# Patient Record
Sex: Male | Born: 1945
Health system: Southern US, Community
[De-identification: ages and names within clinical notes are randomized; demographics above are authoritative.]

## PROBLEM LIST (undated history)

## (undated) DIAGNOSIS — I10 Essential (primary) hypertension: Secondary | ICD-10-CM

## (undated) DIAGNOSIS — E7800 Pure hypercholesterolemia, unspecified: Secondary | ICD-10-CM

## (undated) DIAGNOSIS — F419 Anxiety disorder, unspecified: Secondary | ICD-10-CM

## (undated) DIAGNOSIS — C3490 Malignant neoplasm of unspecified part of unspecified bronchus or lung: Secondary | ICD-10-CM

## (undated) DIAGNOSIS — E785 Hyperlipidemia, unspecified: Secondary | ICD-10-CM

## (undated) DIAGNOSIS — H9190 Unspecified hearing loss, unspecified ear: Secondary | ICD-10-CM

## (undated) HISTORY — PX: DENTAL SURGERY: SHX609

## (undated) HISTORY — DX: Hyperlipidemia, unspecified: E78.5

## (undated) HISTORY — DX: Essential (primary) hypertension: I10

## (undated) HISTORY — DX: Anxiety disorder, unspecified: F41.9

## (undated) HISTORY — PX: POLYPECTOMY: SHX149

## (undated) HISTORY — PX: COLONOSCOPY: SHX174

---

## 2005-12-21 ENCOUNTER — Ambulatory Visit: Payer: Self-pay | Admitting: Gastroenterology

## 2006-01-01 ENCOUNTER — Ambulatory Visit: Payer: Self-pay | Admitting: Gastroenterology

## 2006-01-01 ENCOUNTER — Encounter (INDEPENDENT_AMBULATORY_CARE_PROVIDER_SITE_OTHER): Payer: Self-pay | Admitting: *Deleted

## 2008-01-31 ENCOUNTER — Telehealth: Payer: Self-pay | Admitting: Gastroenterology

## 2008-09-26 ENCOUNTER — Emergency Department (HOSPITAL_COMMUNITY): Admission: EM | Admit: 2008-09-26 | Discharge: 2008-09-26 | Payer: Self-pay | Admitting: Emergency Medicine

## 2008-10-02 ENCOUNTER — Encounter: Payer: Self-pay | Admitting: Family Medicine

## 2009-03-22 ENCOUNTER — Ambulatory Visit: Payer: Self-pay | Admitting: Family Medicine

## 2009-03-22 DIAGNOSIS — E785 Hyperlipidemia, unspecified: Secondary | ICD-10-CM | POA: Insufficient documentation

## 2009-03-22 DIAGNOSIS — Z860100 Personal history of colon polyps, unspecified: Secondary | ICD-10-CM | POA: Insufficient documentation

## 2009-03-22 DIAGNOSIS — I10 Essential (primary) hypertension: Secondary | ICD-10-CM | POA: Insufficient documentation

## 2009-03-22 LAB — CONVERTED CEMR LAB
ALT: 16 units/L (ref 0–53)
AST: 23 units/L (ref 0–37)
Alkaline Phosphatase: 49 units/L (ref 39–117)
Bilirubin, Direct: 0.2 mg/dL (ref 0.0–0.3)
Total Bilirubin: 1 mg/dL (ref 0.3–1.2)
Total CHOL/HDL Ratio: 3

## 2009-08-23 ENCOUNTER — Ambulatory Visit: Payer: Self-pay | Admitting: Family Medicine

## 2009-08-23 DIAGNOSIS — M170 Bilateral primary osteoarthritis of knee: Secondary | ICD-10-CM

## 2009-12-16 ENCOUNTER — Telehealth: Payer: Self-pay | Admitting: Family Medicine

## 2009-12-30 ENCOUNTER — Ambulatory Visit: Payer: Self-pay | Admitting: Family Medicine

## 2009-12-30 DIAGNOSIS — L989 Disorder of the skin and subcutaneous tissue, unspecified: Secondary | ICD-10-CM | POA: Insufficient documentation

## 2010-01-06 ENCOUNTER — Encounter: Payer: Self-pay | Admitting: Family Medicine

## 2010-01-07 ENCOUNTER — Telehealth: Payer: Self-pay | Admitting: Family Medicine

## 2010-09-13 NOTE — Progress Notes (Signed)
Summary: List of Blood Pressures taken at home  List of Blood Pressures taken at home   Imported By: Maryln Gottron 01/12/2010 14:55:39  _____________________________________________________________________  External Attachment:    Type:   Image     Comment:   External Document

## 2010-09-13 NOTE — Progress Notes (Signed)
Summary: Generic for Lipitor request  Phone Note From Pharmacy   [facility]  [place] [address] Call For: John Graham  Summary of Call: Pharmacy faxed request on behalf of pt regarding Lipitor 10mg , "Pt would like to try a generic for a lower price". Initial call taken by: Sid Falcon LPN,  Dec 16, 452 3:51 PM  Follow-up for Phone Call        Try switch to Simvastatin 20 mg by mouth at bedtime and will need f/u labs for lipids and hepatic in 2 months. Follow-up by: Evelena Peat MD,  Dec 16, 2009 5:36 PM  Additional Follow-up for Phone Call Additional follow up Details #1::        Pt informed.  He is going to schedule a CPX in 2 months Additional Follow-up by: Sid Falcon LPN,  Dec 18, 979 9:22 AM    New/Updated Medications: SIMVASTATIN 20 MG TABS (SIMVASTATIN) one tab by mouth at bedtime Prescriptions: SIMVASTATIN 20 MG TABS (SIMVASTATIN) one tab by mouth at bedtime  #30 x 3   Entered by:   Sid Falcon LPN   Authorized by:   Evelena Peat MD   Signed by:   Sid Falcon LPN on 19/14/7829   Method used:   [facility]        [facility]  [place] [address]       [address]       [place], [place]  [address]       Ph: [phone] or [phone]       Fax: [phone]   RxID:   [id number]

## 2010-09-13 NOTE — Progress Notes (Signed)
Summary: BP med added following charted BP readings  Phone Note Call from Patient Call back at Home Phone [phone]   Caller: Patient Call For: John Peat MD Complaint: Breathing Problems Summary of Call: pt sent in several home BP readings.  Consistently high with several readings over 160-170 systolic. Recommend start Lisinopril 10 mg by mouth once daily and schedule office f/u within one month to reassess. Initial call taken by: John Peat MD,  Jan 07, 2010 8:19 AM  Follow-up for Phone Call        Pt informed and he will call for OV Follow-up by: Sid Falcon LPN,  Jan 07, 2010 10:28 AM    New/Updated Medications: LISINOPRIL 10 MG TABS (LISINOPRIL) once daily Prescriptions: LISINOPRIL 10 MG TABS (LISINOPRIL) once daily  #30 x 1   Entered by:   Sid Falcon LPN   Authorized by:   John Peat MD   Signed by:   Sid Falcon LPN on 84/13/2440   Method used:   [facility]        [facility]  [place] [address]       [address]       [place], [place]  [address]       Ph: [phone] or [phone]       Fax: [phone]   RxID:   [id number]

## 2010-09-13 NOTE — Assessment & Plan Note (Signed)
Summary: EVAL OF GROWTH ON HEAD / BACK // RS   Vital Signs:  Patient profile:   65 year old male Temp:     98.5 degrees F oral BP sitting:   162 / 88  (left arm) Cuff size:   regular  Vitals Entered By: Sid Falcon LPN (Dec 30, 2009 4:03 PM) CC: eval new groths on head, side and back   History of Present Illness: Patient seen for evaluation of some skin lesions.  Left parieto-occipital area noted 4-5 weeks ago. Asymptomatic. Noted by his barber.  Thick scaly lesion right lower back which is slightly sore to touch. No history of skin cancer. History of multiple seborrheic keratoses.  Allergies (verified): No Known Drug Allergies  Past History:  Past Medical History: Last updated: 03/22/2009 Colonic polyps, hx of Hyperlipidemia Hypertension  Physical Exam  General:  Well-developed,well-nourished,in no acute distress; alert,appropriate and cooperative throughout examination Skin:  left parieto-occipital region of scalp 8 mm verrucous hyperkeratotic lesion with well-demarcated borders. On his back he has several brownish flat seborrheic keratoses. Right lower back reveals approximately 6 by 8mm thick and hyperkeratotic lesion with somewhat erythematous base. No ulceration.   Impression & Recommendations:  Problem # 1:  SKIN LESION (ICD-709.9) Assessment New  ?keratosis R back.  after reviewing risks and benefits of shave excision patient consented. Prepped skin with Betadine. Anesthesia 1% Xylocaine with epinephrine. Shave excision with #15 blade. Minimal bleeding controlled with Drysol. Antibiotic and dressing applied  Orders: Shave Skin Lesion 0.6-1.0 cm/trunk/arm/leg (11301)  Complete Medication List: 1)  Toprol Xl 50 Mg Xr24h-tab (Metoprolol succinate) .... Once daily 2)  Simvastatin 20 Mg Tabs (Simvastatin) .... Once daily 3)  Centrum Silver Ultra Mens Tabs (Multiple vitamins-minerals) .... Once daily 4)  Ibuprofen 800 Mg Tabs (Ibuprofen) .... One tab three  times a day as needed 5)  Simvastatin 20 Mg Tabs (Simvastatin) .... One tab by mouth at bedtime  Patient Instructions: 1)  Keep wound dry for 24 hours. 2)  Then clean with soap and water daily. 3)  Apply topical antibiotic for 3-4 days. 4)  Follow up promptly for any redness or drainage. Prescriptions: IBUPROFEN 800 MG TABS (IBUPROFEN) one tab three times a day as needed  #90 x 0   Entered by:   Sid Falcon LPN   Authorized by:   Evelena Peat MD   Signed by:   Sid Falcon LPN on 76/16/0737   Method used:   [facility]        [facility]  [place] [address]       [address]       [place], [place]  [address]       Ph: [phone] or [phone]       Fax: [phone]   RxID:   [id number]

## 2010-09-13 NOTE — Assessment & Plan Note (Signed)
Summary: CONSULT GE:[id number] KNEE PAIN/REQ CORTISONE INJ/CJR   Vital Signs:  Patient profile:   65 year old male Weight:      203 pounds Temp:     98.8 degrees F oral BP sitting:   130 / 70  (left arm) Cuff size:   regular  Vitals Entered By: Sid Falcon LPN (August 23, 2009 1:10 PM) CC: Recurrent left knee pain   History of Present Illness: Patient seen with left knee pain.  Known history of osteoarthritis. Approximately 3 years ago received corticosteroid injection from orthopedist which helped a lot. Has also taken prescription strength ibuprofen 800 mg which helps. Pain is actually somewhat better today. One-month history of some left knee pain. No specific injury but this started after doing a lot of walking up hills. No locking or giving way. Pain mostly medially. Exacerbated by going upstairs.  Allergies (verified): No Known Drug Allergies  Past History:  Past Medical History: Last updated: 03/22/2009 Colonic polyps, hx of Hyperlipidemia Hypertension  Review of Systems       occasional early morning stiffness in the knee which improves with walking  Physical Exam  General:  Well-developed,well-nourished,in no acute distress; alert,appropriate and cooperative throughout examination Lungs:  Normal respiratory effort, chest expands symmetrically. Lungs are clear to auscultation, no crackles or wheezes. Heart:  Normal rate and regular rhythm. S1 and S2 normal without gallop, murmur, click, rub or other extra sounds. Extremities:  left knee reveals no effusion. No warmth or erythema. Mild medial joint line tenderness. Minimal crepitus with flexion and extension. Collateral ligament and cruciate ligament testing is normal. No leg edema   Impression & Recommendations:  Problem # 1:  OSTEOARTHRITIS, KNEE (ICD-715.96) offered corticosteroid injection  but at this point he would like to wait. Refill ibuprofen. Discussed exercises to strengthen quadricep muscles. Cautioned  about long term use of NSAIDS. The following medications were removed from the medication list:    Ibuprofen 800 Mg Tabs (Ibuprofen) ..... One by mouth q 8 hours prn His updated medication list for this problem includes:    Ibuprofen 800 Mg Tabs (Ibuprofen) ..... One tab three times a day as needed  Complete Medication List: 1)  Toprol Xl 50 Mg Xr24h-tab (Metoprolol succinate) .... Once daily 2)  Lipitor 10 Mg Tabs (Atorvastatin calcium) .... Once daily 3)  Centrum Silver Ultra Mens Tabs (Multiple vitamins-minerals) .... Once daily 4)  Ibuprofen 800 Mg Tabs (Ibuprofen) .... One tab three times a day as needed  Patient Instructions: 1)  Do some quadriceps strengthening exercises as instructed such as cycling or knee extensions with weights. 2)  Consider steroid injection in 2-3 weeks if no better. Prescriptions: IBUPROFEN 800 MG TABS (IBUPROFEN) one by mouth q 8 hours prn  #60 x 1   Entered and Authorized by:   Evelena Peat MD   Signed by:   Evelena Peat MD on 08/23/2009   Method used:   [facility] to        [facility]  [place] [address] ([facility]ail)       [address]       [place], [place]  [address]       Ph: [phone] or [phone]       Fax: [phone]   RxID:   [id number]

## 2010-10-27 ENCOUNTER — Encounter: Payer: Self-pay | Admitting: Gastroenterology

## 2010-11-01 NOTE — Letter (Signed)
Summary: Pre Visit Letter Revised  [facility]  [address] [place], [place] [address]   Phone: [phone]  Fax: [phone]        10/27/2010 MRN: [id number]  ALVAN CULPEPPER [address] [place], [place]  [address]             Procedure Date: 11-30-10 8:30am            Dr Jarold Motto   Recall Colon  Welcome to the Gastroenterology Division at [facility].    You are scheduled to see a nurse for your pre-procedure visit on 11-16-10 at 10am on the 3rd floor at [facility], [address]  We ask that you try to arrive at our office 15 minutes prior to your appointment time to allow for check-in.  Please take a minute to review the attached form.  If you answer "Yes" to one or more of the questions on the first page, we ask that you call the person listed at your earliest opportunity.  If you answer "No" to all of the questions, please complete the rest of the form and bring it to your appointment.    Your nurse visit will consist of discussing your medical and surgical history, your immediate family medical history, and your medications.   If you are unable to list all of your medications on the form, please bring the medication bottles to your appointment and we will list them.  We will need to be aware of both prescribed and over the counter drugs.  We will need to know exact dosage information as well.    Please be prepared to read and sign documents such as consent forms, a financial agreement, and acknowledgement forms.  If necessary, and with your consent, a friend or relative is welcome to sit-in on the nurse visit with you.  Please bring your insurance card so that we may make a copy of it.  If your insurance requires a referral to see a specialist, please bring your referral form from your primary care physician.  No co-pay is required for this nurse visit.     If you cannot keep your appointment, please call [phone] to cancel or reschedule  prior to your appointment date.  This allows [place] the opportunity to schedule an appointment for another patient in need of care.    Thank you for choosing [facility] for your medical needs.  We appreciate the opportunity to care for you.  Please visit [place] at our website  to learn more about our practice.  Sincerely, The Gastroenterology Division

## 2010-11-16 ENCOUNTER — Ambulatory Visit (AMBULATORY_SURGERY_CENTER): Payer: Managed Care, Other (non HMO) | Admitting: *Deleted

## 2010-11-16 VITALS — Ht 71.0 in | Wt 200.0 lb

## 2010-11-16 DIAGNOSIS — Z86010 Personal history of colonic polyps: Secondary | ICD-10-CM

## 2010-11-16 MED ORDER — PEG-KCL-NACL-NASULF-NA ASC-C 100 G PO SOLR: ORAL | Status: DC

## 2010-11-29 ENCOUNTER — Encounter: Payer: Self-pay | Admitting: Gastroenterology

## 2010-11-29 LAB — CBC
HCT: 38.7 % — ABNORMAL LOW (ref 39.0–52.0)
MCHC: 35.5 g/dL (ref 30.0–36.0)
MCV: 101 fL — ABNORMAL HIGH (ref 78.0–100.0)
Platelets: 154 10*3/uL (ref 150–400)
RDW: 12.5 % (ref 11.5–15.5)
WBC: 7.4 10*3/uL (ref 4.0–10.5)

## 2010-11-29 LAB — POCT I-STAT, CHEM 8
Creatinine, Ser: 0.7 mg/dL (ref 0.4–1.5)
Glucose, Bld: 91 mg/dL (ref 70–99)
Hemoglobin: 13.6 g/dL (ref 13.0–17.0)
Potassium: 3.3 mEq/L — ABNORMAL LOW (ref 3.5–5.1)

## 2010-11-29 LAB — POCT CARDIAC MARKERS
CKMB, poc: <1 ng/mL — ABNORMAL LOW (ref 1.0–8.0)
Troponin i, poc: <0.05 ng/mL (ref 0.00–0.09)

## 2010-11-29 LAB — PROTIME-INR: Prothrombin Time: 13.7 seconds (ref 11.6–15.2)

## 2010-11-30 ENCOUNTER — Ambulatory Visit (AMBULATORY_SURGERY_CENTER): Payer: Managed Care, Other (non HMO) | Admitting: Gastroenterology

## 2010-11-30 ENCOUNTER — Encounter: Payer: Self-pay | Admitting: Gastroenterology

## 2010-11-30 VITALS — BP 158/87 | HR 54 | Temp 97.1°F | Resp 12 | Ht 71.0 in | Wt 202.0 lb

## 2010-11-30 DIAGNOSIS — Z1211 Encounter for screening for malignant neoplasm of colon: Secondary | ICD-10-CM

## 2010-11-30 DIAGNOSIS — D126 Benign neoplasm of colon, unspecified: Secondary | ICD-10-CM

## 2010-11-30 DIAGNOSIS — K5730 Diverticulosis of large intestine without perforation or abscess without bleeding: Secondary | ICD-10-CM

## 2010-11-30 DIAGNOSIS — Z860100 Personal history of colon polyps, unspecified: Secondary | ICD-10-CM

## 2010-11-30 MED ORDER — SODIUM CHLORIDE 0.9 % IV SOLN: 500.0000 mL | INTRAVENOUS | Status: DC

## 2010-11-30 NOTE — Patient Instructions (Signed)
Findings: Diverticulosis-Handout given Polyps-Handout given  You will receive a letter in the mail within 2 weeks with your pathology results  Please eat a diet that is high in fiber  Repeat colonoscopy in 3 years (2015)  You may resume your medications as you would normally take them

## 2010-12-01 ENCOUNTER — Telehealth: Payer: Self-pay | Admitting: *Deleted

## 2010-12-01 NOTE — Telephone Encounter (Signed)

## 2010-12-05 ENCOUNTER — Encounter: Payer: Self-pay | Admitting: Gastroenterology

## 2011-05-05 ENCOUNTER — Ambulatory Visit (INDEPENDENT_AMBULATORY_CARE_PROVIDER_SITE_OTHER): Payer: Managed Care, Other (non HMO) | Admitting: Family Medicine

## 2011-05-05 ENCOUNTER — Encounter: Payer: Self-pay | Admitting: Family Medicine

## 2011-05-05 DIAGNOSIS — E785 Hyperlipidemia, unspecified: Secondary | ICD-10-CM

## 2011-05-05 DIAGNOSIS — B029 Zoster without complications: Secondary | ICD-10-CM

## 2011-05-05 DIAGNOSIS — E559 Vitamin D deficiency, unspecified: Secondary | ICD-10-CM

## 2011-05-05 DIAGNOSIS — I10 Essential (primary) hypertension: Secondary | ICD-10-CM

## 2011-05-05 MED ORDER — VALACYCLOVIR HCL 1 G PO TABS: 1000.0000 mg | ORAL_TABLET | Freq: Three times a day (TID) | ORAL | Status: DC

## 2011-05-05 NOTE — Progress Notes (Signed)
  Subjective:    Patient ID: John Graham, male    DOB: 08-09-1946, 65 y.o.   MRN: [id number]  HPI Medical followup. Recently seen at the [facility]. Blood pressure up change from plain lisinopril to lisinopril HCTZ 20/12.5 one daily. Blood pressures have been improved since then and mostly at goal by home readings. No dizziness. No chest pains. Ongoing nicotine use and is trying to quit with patches.  Onset about 2 weeks ago of left lumbar back pain radiating anterior. Just today he noticed a blistery rash low back and side region. Wife informed him this was shingles. Pain is mild. Burning stinging quality.  Recent labs from [facility] hospital visit reviewed. Low vitamin D of 29. Other labs were stable.   Does not drink milk. No vitamin D supplements.  Past Medical History  Diagnosis Date  . Hypertension   . Hyperlipidemia    Past Surgical History  Procedure Date  . Dental surgery     8 dental implants  . Colonoscopy   . Polypectomy     reports that he has been smoking.  He has never used smokeless tobacco. He reports that he drinks about 2.5 ounces of alcohol per week. He reports that he does not use illicit drugs. family history includes Pancreatic cancer in his mother. No Known Allergies    Review of Systems  Constitutional: Negative for fever and chills.  Eyes: Negative for visual disturbance.  Respiratory: Negative for cough and wheezing.   Cardiovascular: Negative for chest pain, palpitations and leg swelling.  Gastrointestinal: Negative for abdominal pain.  Skin: Positive for rash.  Neurological: Negative for dizziness and headaches.       Objective:   Physical Exam  Constitutional: He appears well-developed and well-nourished.  HENT:  Mouth/Throat: Oropharynx is clear and moist.  Neck: Neck supple.  Cardiovascular: Normal rate and regular rhythm.   Pulmonary/Chest: Effort normal and breath sounds normal. No respiratory distress. He has no wheezes. He has no rales.    Musculoskeletal: He exhibits no edema.  Skin:       Patient has vesicular rash patch distribution left lower lumbar region following dermatome anterior to lower abdomen          Assessment & Plan:  #1 shingles. Valtrex 1 g 3 times a day for 7 days. Pain controlled with over-the-counter medications. #2 hypertension improved with change in blood pressure medication as above. Continue to monitor #3 low vitamin D. Supplement with over-the-counter vitamin D 2000 international units daily and consider repeat vitamin D level followup in 3-4 months  #4 hyperlipidemia. Continue simvastatin 20 mg daily

## 2011-05-05 NOTE — Patient Instructions (Addendum)
Shingles (Herpes Zoster) Shingles is caused by the same virus that causes chicken pox (varicella zoster virus or VZV). Shingles often occurs many years or decades after having chicken pox. That is why it is more common in adults older than 50 years. The virus reactivates and breaks out as an infection in a nerve root. SYMPTOMS  The initial feeling (sensations) may be pain. This pain is usually described as:   Burning.   Stabbing.   Throbbing.   Tingling in the nerve root.   A red rash will follow in a couple days. The rash may occur in any area of the body and is usually on one side (unilateral) of the body in a band or belt-like pattern. The rash usually starts out as very small blisters (vesicles). They will dry up after 7 to 10 days. This is not usually a significant problem except for the pain it causes.   Long lasting (chronic) pain is more likely in an elderly person. It can last months to years. This condition is called post-herpetic neuralgia.  Shingles can be an extremely severe infection in someone with AIDS, a weakened immune system or with forms of leukemia. It can also be severe if you are taking transplant medications or other medications that weaken the immune system. TREATMENT Your caregiver will often treat you with:  Antiviral drugs.   Anti-inflammatory drugs.   Pain medications.   Bed rest is very important in preventing the pain associated with herpes zoster (post-herpetic neuralgia).   Application of heat in the form of a hot-water bottle or electric heating pad or gentle pressure with the hand is recommended to help with the pain or discomfort.  PREVENTION A varicella zoster vaccine is available to help protect against the virus. The Food and Drug Administration approved the varicella zoster vaccine for individuals 65 years of age and older. HOME CARE INSTRUCTIONS  Cool compresses to the area of rash may be helpful.   Only take over-the-counter or  prescription medicines for pain, discomfort or fever as directed by your caregiver.   Avoid contact with:   Babies.   Pregnant women.   Children with eczema.   Elderly people with transplants.   People with chronic illnesses, such as leukemia and AIDS.   If the area involved is on your face, you may receive a referral for follow-up to a specialist. It is very important to keep all follow-up appointments. This will help avoid eye complications, chronic pain or disability.  SEEK IMMEDIATE MEDICAL CARE IF:  You develop any pain (headache) in the area of the face or eye. This must be followed carefully by your caregiver or ophthalmologist. An infection in part of your eye (cornea) can be very serious. It could lead to blindness.   You do not have pain relief from prescribed medications.   The redness or swelling spreads.   The area involved becomes very swollen and painful.   You have an oral temperature above 102 F (38 C), not controlled by medicine.   You notice any red or painful lines extending away from the affected area toward your heart (lymphangitis).   Your condition is worsening or has changed.  Document Released: 07/31/2005 Document Re-Released: 01/18/2010 [facility] Patient Information 2011 [place], [place].  Consider Vit D 1,000 to 2,000 IU daily

## 2011-05-08 ENCOUNTER — Telehealth: Payer: Self-pay | Admitting: *Deleted

## 2011-05-08 MED ORDER — HYDROCODONE-ACETAMINOPHEN 5-325 MG PO TABS: 1.0000 | ORAL_TABLET | Freq: Four times a day (QID) | ORAL | Status: AC | PRN

## 2011-05-08 NOTE — Telephone Encounter (Signed)
Pt is having increasing pain with the Shingles, and would like RX for pain.  [facility]

## 2011-05-08 NOTE — Telephone Encounter (Signed)
Vicodin 5/325 mg 1-2 po q 6 hours prn pain, disp #40 with no refill.

## 2011-05-08 NOTE — Telephone Encounter (Signed)
Pt informed

## 2011-05-10 ENCOUNTER — Telehealth: Payer: Self-pay | Admitting: *Deleted

## 2011-05-10 MED ORDER — IBUPROFEN 800 MG PO TABS: 800.0000 mg | ORAL_TABLET | Freq: Three times a day (TID) | ORAL | Status: AC | PRN

## 2011-05-10 NOTE — Telephone Encounter (Signed)
Wife is stating the Vicodin did not help Pt., but he took an Ibuprofen 800 mg that seemed to work better, and would like this called in, please.

## 2011-05-10 NOTE — Telephone Encounter (Signed)
OK to use Motrin 800 mg po q 8 hours prn and take with food and stop for any stomach issues Disp#60 with no refill.

## 2011-05-22 ENCOUNTER — Encounter: Payer: Self-pay | Admitting: Family Medicine

## 2011-05-22 ENCOUNTER — Ambulatory Visit (INDEPENDENT_AMBULATORY_CARE_PROVIDER_SITE_OTHER): Payer: Medicare Other | Admitting: Family Medicine

## 2011-05-22 ENCOUNTER — Telehealth: Payer: Self-pay | Admitting: *Deleted

## 2011-05-22 VITALS — BP 120/78 | Temp 98.0°F | Wt 205.0 lb

## 2011-05-22 DIAGNOSIS — R1904 Left lower quadrant abdominal swelling, mass and lump: Secondary | ICD-10-CM

## 2011-05-22 DIAGNOSIS — K5900 Constipation, unspecified: Secondary | ICD-10-CM

## 2011-05-22 DIAGNOSIS — Z23 Encounter for immunization: Secondary | ICD-10-CM

## 2011-05-22 DIAGNOSIS — B029 Zoster without complications: Secondary | ICD-10-CM

## 2011-05-22 NOTE — Progress Notes (Signed)
  Subjective:    Patient ID: John Graham, male    DOB: [birth year], 65 y.o.   MRN: [id number]  HPI Patient seen with asymmetry with bulging left upper to mid quadrant region. Correlates with region of shingles from a few weeks ago. Rash is healing and pain is slowly improving. Not using any opioids. He has noticed some asymmetry with bulge in that region which correlated with the onset of the shingles pain. Does have occasional constipation. Usually bowel movement every 2-3 days. Decreased volume of stools at times. Not eating as well since shingles. Low fiber intake. Colonoscopy in May of 2012 unremarkable. No bloody stools. Denies fever or chills. No nausea or vomiting.  Past Medical History  Diagnosis Date  . Hypertension   . Hyperlipidemia    Past Surgical History  Procedure Date  . Dental surgery     8 dental implants  . Colonoscopy   . Polypectomy     reports that he has been smoking.  He has never used smokeless tobacco. He reports that he drinks about 2.5 ounces of alcohol per week. He reports that he does not use illicit drugs. family history includes Pancreatic cancer in his mother. No Known Allergies    Review of Systems  Constitutional: Negative for fever, chills and unexpected weight change.  Respiratory: Negative for shortness of breath.   Cardiovascular: Negative for chest pain.  Gastrointestinal: Positive for constipation. Negative for vomiting, abdominal pain, diarrhea, blood in stool and rectal pain.  Genitourinary: Negative for dysuria, hematuria and flank pain.  Musculoskeletal: Negative for back pain.  Skin: Negative for wound.  Hematological: Negative for adenopathy.       Objective:   Physical Exam  Constitutional: He appears well-developed and well-nourished.  Cardiovascular: Normal rate and regular rhythm.   Pulmonary/Chest: Effort normal and breath sounds normal. No respiratory distress. He has no wheezes. He has no rales.  Abdominal: Soft. Bowel  sounds are normal. There is no tenderness. There is no rebound and no guarding.       Patient has some asymmetry left mid quadrant compared to right. Nontender. No hernia appreciated. Area of mild swelling correlates with region of recent shingles involvement.  Skin:       Healing rash from recent shingles left lower thoracic region spreading anterior          Assessment & Plan:  Patient presents with some mild asymmetric swelling left mid to lower abdominal quadrant. No hernia appreciated. Correlates with region of recent shingles and suspect this is benign mild soft tissue swelling. Possibly exacerbated by constipation. Shingles causing minimal pain at this time and rash healing well.  Measures to reduce constipation discussed.

## 2011-05-22 NOTE — Telephone Encounter (Signed)
Wife states pt has a large bulge on left upper quadrant with constipation.  Offered appt today or tomorrow and they will call back to let us know which is better.

## 2011-05-22 NOTE — Telephone Encounter (Signed)
Pts spouse called and said that her husband could come in for ov this afternoon. Pt has been sch to see Dr Caryl Never at 3:30 pm today for large bulge in upper lft quadrant, as noted.

## 2011-05-22 NOTE — Patient Instructions (Signed)
Constipation in Adults Constipation is having fewer than 2 bowel movements per week. Usually, the stools are hard. As we grow older, constipation is more common. If you try to fix constipation with laxatives, the problem may get worse. This is because laxatives taken over a long period of time make the colon muscles weaker. A low-fiber diet, not taking in enough fluids, and taking some medicines may make these problems worse. MEDICATIONS THAT MAY CAUSE CONSTIPATION  Water pills (diuretics).  Calcium channel blockers (used to control blood pressure and for the heart).   Certain pain medicines (narcotics).   Anticholinergics.  Anti-inflammatory agents.   Antacids that contain aluminum.   DISEASES THAT CONTRIBUTE TO CONSTIPATION  Diabetes.  Parkinson's disease.   Dementia.   Stroke.  Depression.   Illnesses that cause problems with salt and water metabolism.   HOME CARE INSTRUCTIONS  Constipation is usually best cared for without medicines. Increasing dietary fiber and eating more fruits and vegetables is the best way to manage constipation.   Slowly increase fiber intake to 25 to 38 grams per day. Whole grains, fruits, vegetables, and legumes are good sources of fiber. A dietitian can further help you incorporate high-fiber foods into your diet.   Drink enough water and fluids to keep your urine clear or pale yellow.   A fiber supplement may be added to your diet if you cannot get enough fiber from foods.   Increasing your activities also helps improve regularity.   Suppositories, as suggested by your caregiver, will also help. If you are using antacids, such as aluminum or calcium containing products, it will be helpful to switch to products containing magnesium if your caregiver says it is okay.   If you have been given a liquid injection (enema) today, this is only a temporary measure. It should not be relied on for treatment of longstanding (chronic) constipation.    Stronger measures, such as magnesium sulfate, should be avoided if possible. This may cause uncontrollable diarrhea. Using magnesium sulfate may not allow you time to make it to the bathroom.  SEEK IMMEDIATE MEDICAL CARE IF:  There is bright red blood in the stool.   The constipation stays for more than 4 days.   There is belly (abdominal) or rectal pain.   You do not seem to be getting better.   You have any questions or concerns.  MAKE SURE YOU:  Understand these instructions.   Will watch your condition.   Will get help right away if you are not doing well or get worse.  Document Released: 04/28/2004 Document Re-Released: 10/25/2009 [facility] Patient Information 2011 [place], [place].  Fibercon, metamucil, or citracil

## 2011-06-28 ENCOUNTER — Telehealth: Payer: Self-pay | Admitting: Family Medicine

## 2011-06-28 NOTE — Telephone Encounter (Signed)
Pt needs rx sent for simvastatin (ZOCOR) 20 MG tablet  metoprolol tartrate (LOPRESSOR) 25 MG tablet  lisinopril-hydrochlorothiazide (PRINZIDE,ZESTORETIC) 20-12.5 MG per tablet   [facility]  Pt is no longer eligible to get RX through the [place] and needs them [facility] into [facility]

## 2011-06-29 MED ORDER — METOPROLOL TARTRATE 25 MG PO TABS: 25.0000 mg | ORAL_TABLET | Freq: Two times a day (BID) | ORAL | Status: DC

## 2011-06-29 MED ORDER — LISINOPRIL-HYDROCHLOROTHIAZIDE 20-12.5 MG PO TABS: 1.0000 | ORAL_TABLET | Freq: Every day | ORAL | Status: DC

## 2011-06-29 MED ORDER — SIMVASTATIN 20 MG PO TABS: 20.0000 mg | ORAL_TABLET | Freq: Every day | ORAL | Status: DC

## 2011-07-07 ENCOUNTER — Ambulatory Visit (INDEPENDENT_AMBULATORY_CARE_PROVIDER_SITE_OTHER): Payer: Medicare Other | Admitting: Family Medicine

## 2011-07-07 ENCOUNTER — Encounter: Payer: Self-pay | Admitting: Family Medicine

## 2011-07-07 VITALS — BP 114/80 | Temp 98.2°F | Wt 204.0 lb

## 2011-07-07 DIAGNOSIS — I10 Essential (primary) hypertension: Secondary | ICD-10-CM

## 2011-07-07 DIAGNOSIS — R198 Other specified symptoms and signs involving the digestive system and abdomen: Secondary | ICD-10-CM

## 2011-07-07 DIAGNOSIS — E785 Hyperlipidemia, unspecified: Secondary | ICD-10-CM

## 2011-07-07 LAB — BASIC METABOLIC PANEL
GFR: 98.67 mL/min (ref >60.00)
Potassium: 4.3 mEq/L (ref 3.5–5.1)
Sodium: 142 mEq/L (ref 135–145)

## 2011-07-07 NOTE — Progress Notes (Signed)
  Subjective:    Patient ID: John Graham, male    DOB: 01/10/1946, 65 y.o.   MRN: [id number]  HPI  Medical followup. Patient previously treated at [facility]. He made too much money last year to continue getting medications there and will be followed here from now on. History of hyperlipidemia, hypertension, and osteoarthritis. Medications reviewed. Recent addition of lisinopril HCTZ. No side effects. Needs a basic metabolic panel. Blood pressure is well controlled home readings. No orthostasis. No chest pains. No dyspnea. Hyperlipidemia treated with simvastatin 20 mg daily. No myalgias. Continues to smoke.  Some abdominal asymmetry as previously noted. Recent shingles left side. No significant post neuropathy pain. Denies change in stool habits. Recent colonoscopy earlier this year unremarkable. Denies any appetite or weight changes.  Past Medical History  Diagnosis Date  . Hypertension   . Hyperlipidemia    Past Surgical History  Procedure Date  . Dental surgery     8 dental implants  . Colonoscopy   . Polypectomy     reports that he has been smoking.  He has never used smokeless tobacco. He reports that he drinks about 2.5 ounces of alcohol per week. He reports that he does not use illicit drugs. family history includes Pancreatic cancer in his mother. No Known Allergies    Review of Systems  Constitutional: Negative for fever, fatigue and unexpected weight change.  Respiratory: Negative for cough and shortness of breath.   Cardiovascular: Negative for chest pain, palpitations and leg swelling.  Gastrointestinal: Negative for nausea, vomiting, abdominal pain, diarrhea, constipation and blood in stool.  Skin: Negative for rash.       Objective:   Physical Exam  Constitutional: He appears well-developed and well-nourished. No distress.  Eyes: Pupils are equal, round, and reactive to light.  Neck: Neck supple.  Cardiovascular: Normal rate and regular rhythm.     Pulmonary/Chest: Effort normal and breath sounds normal. No respiratory distress. He has no wheezes. He has no rales.  Abdominal: Soft. Bowel sounds are normal. There is no tenderness. There is no rebound and no guarding.  Musculoskeletal: He exhibits no edema.  Lymphadenopathy:    He has no cervical adenopathy.          Assessment & Plan:  #1 hypertension stable recheck basic metabolic panel #2 hyperlipidemia. Treated with simvastatin 20 mg daily. Consider followup fasting lipids and hepatic in 6 months #3 ongoing nicotine use. No motivation to quit. #4 abdominal asymmetry. Unchanged. No mass palpated. Observe.  He does not have any worrisome symptoms such as appetite or weight changes or associated pain

## 2011-07-10 NOTE — Progress Notes (Signed)
Quick Note:  Pt is aware. ______ 

## 2011-12-09 ENCOUNTER — Emergency Department (HOSPITAL_COMMUNITY)
Admission: EM | Admit: 2011-12-09 | Discharge: 2011-12-09 | Disposition: A | Discharge status: Home / Self Care | Payer: Medicare Other | Attending: Emergency Medicine | Admitting: Emergency Medicine

## 2011-12-09 ENCOUNTER — Encounter (HOSPITAL_COMMUNITY): Payer: Self-pay | Admitting: Neurology

## 2011-12-09 DIAGNOSIS — R55 Syncope and collapse: Secondary | ICD-10-CM | POA: Insufficient documentation

## 2011-12-09 DIAGNOSIS — E7800 Pure hypercholesterolemia, unspecified: Secondary | ICD-10-CM | POA: Insufficient documentation

## 2011-12-09 DIAGNOSIS — I10 Essential (primary) hypertension: Secondary | ICD-10-CM | POA: Insufficient documentation

## 2011-12-09 DIAGNOSIS — R110 Nausea: Secondary | ICD-10-CM | POA: Insufficient documentation

## 2011-12-09 DIAGNOSIS — Z79899 Other long term (current) drug therapy: Secondary | ICD-10-CM | POA: Insufficient documentation

## 2011-12-09 DIAGNOSIS — E860 Dehydration: Secondary | ICD-10-CM | POA: Insufficient documentation

## 2011-12-09 HISTORY — DX: Pure hypercholesterolemia, unspecified: E78.00

## 2011-12-09 LAB — URINALYSIS, ROUTINE W REFLEX MICROSCOPIC
Glucose, UA: NEGATIVE mg/dL
Ketones, ur: NEGATIVE mg/dL
pH: 6.5 (ref 5.0–8.0)

## 2011-12-09 LAB — BASIC METABOLIC PANEL
BUN: 17 mg/dL (ref 6–23)
CO2: 27 mEq/L (ref 19–32)
Chloride: 101 mEq/L (ref 96–112)
Creatinine, Ser: 0.91 mg/dL (ref 0.50–1.35)

## 2011-12-09 LAB — DIFFERENTIAL
Myelocytes: 0 %
Neutro Abs: 6.1 10*3/uL (ref 1.7–7.7)
Neutrophils Relative %: 65 % (ref 43–77)
Promyelocytes Absolute: 0 %
nRBC: 0 /100 WBC

## 2011-12-09 LAB — CBC
MCH: 34.8 pg — ABNORMAL HIGH (ref 26.0–34.0)
MCHC: 35.3 g/dL (ref 30.0–36.0)
Platelets: 158 10*3/uL (ref 150–400)

## 2011-12-09 LAB — GLUCOSE, CAPILLARY: Glucose-Capillary: 89 mg/dL (ref 70–99)

## 2011-12-09 LAB — POCT I-STAT TROPONIN I: Troponin i, poc: 0 ng/mL (ref 0.00–0.08)

## 2011-12-09 LAB — URINE MICROSCOPIC-ADD ON

## 2011-12-09 MED ORDER — SODIUM CHLORIDE 0.9 % IV BOLUS (SEPSIS)
1000.0000 mL | Freq: Once | INTRAVENOUS | Status: AC
  Administered 2011-12-09: 1000 mL via INTRAVENOUS

## 2011-12-09 NOTE — Discharge Instructions (Signed)
Your lab work did not show any significant findings. Make sure you always eat 3-4 meals a day. Make sure you drink plenty of water especially if outside all day. Follow up with your doctor in 3 days for recheck. Return if symptoms worsen.   Near-Syncope Near-syncope is sudden weakness, dizziness, or feeling like you might pass out (faint). This may occur when getting up after sitting or while standing for a long period of time. Near-syncope can be caused by a drop in blood pressure. This is a common reaction, but it may occur to a greater degree in people taking medicines to control their blood pressure. Fainting often occurs when the blood pressure or pulse is too low to provide enough blood flow to the brain to keep you conscious. Fainting and near-syncope are not usually due to serious medical problems. However, certain people should be more cautious in the event of near-syncope, including elderly patients, patients with diabetes, and patients with a history of heart conditions (especially irregular rhythms).  CAUSES   Drop in blood pressure.   Physical pain.   Dehydration.   Heat exhaustion.   Emotional distress.   Low blood sugar.   Internal bleeding.   Heart and circulatory problems.   Infections.  SYMPTOMS   Dizziness.   Feeling sick to your stomach (nauseous).   Nearly fainting.   Body numbness.   Turning pale.   Tunnel vision.   Weakness.  HOME CARE INSTRUCTIONS   Lie down right away if you start feeling like you might faint. Breathe deeply and steadily. Wait until all the symptoms have passed. Most of these episodes last only a few minutes. You may feel tired for several hours.   Drink enough fluids to keep your urine clear or pale yellow.   If you are taking blood pressure or heart medicine, get up slowly, taking several minutes to sit and then stand. This can reduce dizziness that is caused by a drop in blood pressure.  SEEK IMMEDIATE MEDICAL CARE IF:   You  have a severe headache.   Unusual pain develops in the chest, abdomen, or back.   There is bleeding from the mouth or rectum, or you have black or tarry stool.   An irregular heartbeat or a very rapid pulse develops.   You have repeated fainting or seizure-like jerking during an episode.   You faint when sitting or lying down.   You develop confusion.   You have difficulty walking.   Severe weakness develops.   Vision problems develop.  MAKE SURE YOU:   Understand these instructions.   Will watch your condition.   Will get help right away if you are not doing well or get worse.  Document Released: 07/31/2005 Document Revised: 07/20/2011 Document Reviewed: 09/16/2010 [facility] Patient Information 2012 [place], [place].

## 2011-12-09 NOTE — ED Notes (Addendum)
Per ems- pt had syncopal episode at [employer], when EMS arrived, pt sitting up, conscious and oriented. Initial bp 70/40, pale, diaphoretic. BP increased to 107/68 after 500 cc. 20 in L. Forearm. Denying any cp or sob. HR 50, sinus bradycardia. EKG unremarkable. Most recent HR 72, CBG 92. Pt talking, alert. No neuro deficits noted.

## 2011-12-09 NOTE — ED Provider Notes (Signed)
History     CSN: [id number]  Arrival date & time 12/09/11  1726   First MD Initiated Contact with Patient 12/09/11 1734      Chief Complaint  Patient presents with  . Loss of Consciousness    (Consider location/radiation/quality/duration/timing/severity/associated sxs/prior treatment) Patient is a 66 y.o. male presenting with syncope. The history is provided by the patient.  Loss of Consciousness This is a new problem. The current episode started today. Associated symptoms include diaphoresis, fatigue, nausea and weakness. Pertinent negatives include no abdominal pain, chest pain, chills, fever, headaches, neck pain, numbness, visual change or vomiting. The symptoms are aggravated by nothing.  Pt states he was in line at [employer] when he suddenly became dizzy, diaphoretic, states felt like was going to pass out. States tried to hold on to the counter, and was helped into a chair and then to the floor by bystanders. Pt states feeling better now. Reports earlier today, did some yard work from 8-4, has not had anything to eat today, has not had anything to drink other then one [employer] and half of can of cheer wine. Pt states similar episode happened about 8 years ago. Denies headache, chest pain, abdominal pain. No other complaints.   Past Medical History  Diagnosis Date  . Hypertension   . Hypercholesteremia     History reviewed. No pertinent past surgical history.  No family history on file.  History  Substance Use Topics  . Smoking status: Current Everyday Smoker  . Smokeless tobacco: Not on file  . Alcohol Use: Yes      Review of Systems  Constitutional: Positive for diaphoresis and fatigue. Negative for fever and chills.  HENT: Negative for neck pain and neck stiffness.   Eyes: Negative for visual disturbance.  Respiratory: Negative.   Cardiovascular: Positive for syncope. Negative for chest pain, palpitations and leg swelling.  Gastrointestinal: Positive for nausea.  Negative for vomiting, abdominal pain and diarrhea.  Genitourinary: Negative for dysuria.  Skin: Negative.   Neurological: Positive for dizziness, weakness and light-headedness. Negative for seizures, numbness and headaches.  Psychiatric/Behavioral: Negative.     Allergies  Review of patient's allergies indicates no known allergies.  Home Medications   Current Outpatient Rx  Name Route Sig Dispense Refill  . LISINOPRIL-HYDROCHLOROTHIAZIDE 20-12.5 MG PO TABS Oral Take 1 tablet by mouth daily.    Marland Kitchen METOPROLOL TARTRATE 25 MG PO TABS Oral Take 25 mg by mouth 2 (two) times daily.    . CENTRUM PO Oral Take 1 tablet by mouth every morning.    Marland Kitchen SIMVASTATIN 20 MG PO TABS Oral Take 20 mg by mouth every evening.      BP 123/72  Pulse 89  Temp(Src) 97.9 F (36.6 C) (Oral)  Resp 11  SpO2 98%  Physical Exam  Nursing note and vitals reviewed. Constitutional: He is oriented to person, place, and time. He appears well-developed and well-nourished. No distress.  HENT:  Head: Normocephalic and atraumatic.       Oral mucosa appears dry  Eyes: Conjunctivae and EOM are normal. Pupils are equal, round, and reactive to light.  Neck: Normal range of motion. Neck supple.  Cardiovascular: Normal rate and regular rhythm.   Pulmonary/Chest: Effort normal and breath sounds normal. No respiratory distress. He has no wheezes. He has no rales.  Abdominal: Soft. Bowel sounds are normal. He exhibits no distension. There is no tenderness.  Musculoskeletal: Normal range of motion. He exhibits no edema.  Lymphadenopathy:    He has  no cervical adenopathy.  Neurological: He is alert and oriented to person, place, and time. No cranial nerve deficit. He exhibits normal muscle tone. Coordination normal.       Equal grip strength bilaterally, 5/5 upper and lower extremity strength bilaterally  Skin: Skin is warm and dry.  Psychiatric: He has a normal mood and affect.    ED Course  Procedures (including critical  care time)   Date: 12/09/2011  Rate: 68  Rhythm: normal sinus rhythm  QRS Axis: normal  Intervals: normal  ST/T Wave abnormalities: normal  Conduction Disutrbances:none  Narrative Interpretation:   Old EKG Reviewed: none available  Pt with presyncopal episode just prior to arrival. His VS are all normal here, he is not orthostatic. His ECG normal. He hasnt eaten today and has not had much fluid for the exception for an alcoholic beverage. Will get labs, monitor  Results for orders placed during the hospital encounter of 12/09/11  CBC      Component Value Range   WBC 9.3  4.0 - 10.5 (K/uL)   RBC 3.39 (*) 4.22 - 5.81 (MIL/uL)   Hemoglobin 11.8 (*) 13.0 - 17.0 (g/dL)   HCT 04.5 (*) 40.9 - 52.0 (%)   MCV 98.5  78.0 - 100.0 (fL)   MCH 34.8 (*) 26.0 - 34.0 (pg)   MCHC 35.3  30.0 - 36.0 (g/dL)   RDW 81.1  91.4 - 78.2 (%)   Platelets 158  150 - 400 (K/uL)  DIFFERENTIAL      Component Value Range   Neutrophils Relative 65  43 - 77 (%)   Lymphocytes Relative 25  12 - 46 (%)   Monocytes Relative 10  3 - 12 (%)   Eosinophils Relative 0  0 - 5 (%)   Basophils Relative 0  0 - 1 (%)   Band Neutrophils 0  0 - 10 (%)   Metamyelocytes Relative 0     Myelocytes 0     Promyelocytes Absolute 0     Blasts 0     nRBC 0  0 (/100 WBC)   Neutro Abs 6.1  1.7 - 7.7 (K/uL)   Lymphs Abs 2.3  0.7 - 4.0 (K/uL)   Monocytes Absolute 0.9  0.1 - 1.0 (K/uL)   Eosinophils Absolute 0.0  0.0 - 0.7 (K/uL)   Basophils Absolute 0.0  0.0 - 0.1 (K/uL)  BASIC METABOLIC PANEL      Component Value Range   Sodium 137  135 - 145 (mEq/L)   Potassium 3.8  3.5 - 5.1 (mEq/L)   Chloride 101  96 - 112 (mEq/L)   CO2 27  19 - 32 (mEq/L)   Glucose, Bld 85  70 - 99 (mg/dL)   BUN 17  6 - 23 (mg/dL)   Creatinine, Ser 9.56  0.50 - 1.35 (mg/dL)   Calcium 8.8  8.4 - 21.3 (mg/dL)   GFR calc non Af Amer 84 (*) >90 (mL/min)   GFR calc Af Amer >90  >90 (mL/min)  URINALYSIS, ROUTINE W REFLEX MICROSCOPIC      Component Value  Range   Color, Urine YELLOW  YELLOW    APPearance CLEAR  CLEAR    Specific Gravity, Urine 1.011  1.005 - 1.030    pH 6.5  5.0 - 8.0    Glucose, UA NEGATIVE  NEGATIVE (mg/dL)   Hgb urine dipstick NEGATIVE  NEGATIVE    Bilirubin Urine NEGATIVE  NEGATIVE    Ketones, ur NEGATIVE  NEGATIVE (mg/dL)   Protein,  ur NEGATIVE  NEGATIVE (mg/dL)   Urobilinogen, UA 1.0  0.0 - 1.0 (mg/dL)   Nitrite NEGATIVE  NEGATIVE    Leukocytes, UA TRACE (*) NEGATIVE   ETHANOL      Component Value Range   Alcohol, Ethyl (B) 24 (*) 0 - 11 (mg/dL)  GLUCOSE, CAPILLARY      Component Value Range   Glucose-Capillary 89  70 - 99 (mg/dL)  POCT I-STAT TROPONIN I      Component Value Range   Troponin i, poc 0.00  0.00 - 0.08 (ng/mL)   Comment 3           URINE MICROSCOPIC-ADD ON      Component Value Range   Squamous Epithelial / LPF RARE  RARE    WBC, UA 0-2  <3 (WBC/hpf)   Casts HYALINE CASTS (*) NEGATIVE    No results found.  9:22 PM Pt reassessed. He is feeling well. He ambulated with no problems. He is having no chest pain, SOB, dizziness, nausea, vomiting, headaches. His VS are all within normal. I believe his dizziness was caused by lack of food, dehydration, and alcoholic beverage. Will d/c home with close follow up.     1. Pre-syncope   2. Dehydration       MDM          Lottie Mussel, PA 12/09/11 2328  Lottie Mussel, PA 12/09/11 2329

## 2011-12-09 NOTE — ED Provider Notes (Signed)
Patient relates he was outside doing yard work all day today. He states he has not eaten all day. He does admit to drinking some alcohol. He relates that he was in line at the [employer] to get something to eat and he started feeling dizzy which meant he felt like he was getting weak and  going to pass out. He denies any spinning. He states he grabbed onto something however he ended up sitting down and states he had near syncope, he does not feel like he was totally unconscious. He reports he was diaphoretic but denies chest pain, shortness of breath, or headache. He reports EMS said his initial blood pressure was 70/40. Patient relates this happen once before 6 or 7 years ago when he had low blood pressure.  Patient is alert and cooperative he is noted to start having shivering. He's not respiratory distress. His blood pressure is 105 systolic on the monitor.  Medical screening examination/treatment/procedure(s) were conducted as a shared visit with non-physician practitioner(s) and myself.  I personally evaluated the patient during the encounter Devoria Albe, MD, Franz Dell, MD 12/10/11 0000

## 2011-12-09 NOTE — ED Notes (Signed)
Pt aware of the need for urine sample  

## 2011-12-09 NOTE — ED Notes (Signed)
ON assessment PT reports he did not eat today and He mowed his yard . Pt also had a mixed drink before going to [employer] for dinner.

## 2011-12-10 NOTE — ED Provider Notes (Signed)
See prior note   Ward Givens, MD 12/10/11 0003

## 2011-12-11 ENCOUNTER — Encounter: Payer: Self-pay | Admitting: Family Medicine

## 2012-01-04 ENCOUNTER — Ambulatory Visit (INDEPENDENT_AMBULATORY_CARE_PROVIDER_SITE_OTHER): Payer: Medicare Other | Admitting: Family Medicine

## 2012-01-04 ENCOUNTER — Encounter: Payer: Self-pay | Admitting: Family Medicine

## 2012-01-04 VITALS — BP 130/80 | Temp 98.0°F | Wt 200.0 lb

## 2012-01-04 DIAGNOSIS — IMO0002 Reserved for concepts with insufficient information to code with codable children: Secondary | ICD-10-CM

## 2012-01-04 DIAGNOSIS — D649 Anemia, unspecified: Secondary | ICD-10-CM

## 2012-01-04 DIAGNOSIS — R55 Syncope and collapse: Secondary | ICD-10-CM

## 2012-01-04 DIAGNOSIS — Z23 Encounter for immunization: Secondary | ICD-10-CM

## 2012-01-04 DIAGNOSIS — I10 Essential (primary) hypertension: Secondary | ICD-10-CM

## 2012-01-04 DIAGNOSIS — E785 Hyperlipidemia, unspecified: Secondary | ICD-10-CM

## 2012-01-04 DIAGNOSIS — M1710 Unilateral primary osteoarthritis, unspecified knee: Secondary | ICD-10-CM

## 2012-01-04 LAB — HEPATIC FUNCTION PANEL
Bilirubin, Direct: 0.1 mg/dL (ref 0.0–0.3)
Total Bilirubin: 0.6 mg/dL (ref 0.3–1.2)
Total Protein: 7.3 g/dL (ref 6.0–8.3)

## 2012-01-04 LAB — LIPID PANEL
Cholesterol: 143 mg/dL (ref 0–200)
HDL: 45 mg/dL (ref >39.00)
Triglycerides: 66 mg/dL (ref 0.0–149.0)
VLDL: 13.2 mg/dL (ref 0.0–40.0)

## 2012-01-04 LAB — CBC WITH DIFFERENTIAL/PLATELET
Eosinophils Absolute: 0.1 10*3/uL (ref 0.0–0.7)
MCHC: 33.7 g/dL (ref 30.0–36.0)
MCV: 102.9 fl — ABNORMAL HIGH (ref 78.0–100.0)
Monocytes Absolute: 0.6 10*3/uL (ref 0.1–1.0)
Neutrophils Relative %: 71.8 % (ref 43.0–77.0)
Platelets: 191 10*3/uL (ref 150.0–400.0)

## 2012-01-04 MED ORDER — IBUPROFEN 800 MG PO TABS: 800.0000 mg | ORAL_TABLET | Freq: Three times a day (TID) | ORAL | Status: DC | PRN

## 2012-01-04 NOTE — Progress Notes (Signed)
  Subjective:    Patient ID: John Graham, male    DOB: 1945/12/17, 66 y.o.   MRN: [id number]  HPI  Medical followup. Patient has history of ongoing nicotine use, hypertension, and hyperlipidemia. Recent near syncopal episode. He had been working out in his yard for about 10 hours without anything to eat or drink. He went later that evening to pick up dinner and had near syncopal episode. No chest pain. Blood pressure 70/40. Taken to the emergency room. Workup there labs mostly unremarkable but hemoglobin 11.8 with normal MCV. Cardiac enzymes negative. Electrolytes unremarkable. No episodes of dizziness since then. No recent chest pains.  Patient had colonoscopy just last year. No history of abdominal pain or melena.  Hyperlipidemia on simvastatin. No myalgias. Overdue for labs with lipid and hepatic panel. He takes lisinopril HCTZ and metoprolol for hypertension. Blood pressure well controlled. No recent orthostasis other than episode of dizziness above. Osteoarthritis mostly knees. Rarely takes ibuprofen. No history of peptic disease.  No history of documented Pneumovax. Last tetanus unknown.  Past Medical History  Diagnosis Date  . Hyperlipidemia   . Hypertension   . Hypercholesteremia    Past Surgical History  Procedure Date  . Dental surgery     8 dental implants  . Colonoscopy   . Polypectomy     reports that he has been smoking.  He does not have any smokeless tobacco history on file. He reports that he drinks about 2.5 ounces of alcohol per week. He reports that he does not use illicit drugs. family history includes Pancreatic cancer in his mother. No Known Allergies   Review of Systems  Constitutional: Negative for fatigue.  Eyes: Negative for visual disturbance.  Respiratory: Negative for cough, chest tightness and shortness of breath.   Cardiovascular: Negative for chest pain, palpitations and leg swelling.  Neurological: Negative for dizziness, syncope, weakness,  light-headedness and headaches.       Objective:   Physical Exam  Constitutional: He is oriented to person, place, and time. He appears well-developed and well-nourished.  HENT:  Mouth/Throat: Oropharynx is clear and moist.  Neck: Neck supple. No thyromegaly present.  Cardiovascular: Normal rate and regular rhythm.   Pulmonary/Chest:       Somewhat diminished breath sounds throughout but clear  Abdominal: Soft. There is no tenderness.  Musculoskeletal: He exhibits no edema.  Neurological: He is alert and oriented to person, place, and time. No cranial nerve deficit.  Psychiatric: He has a normal mood and affect. His behavior is normal.          Assessment & Plan:  #1 hypertension. Stable. Continue current medications. #2 hyperlipidemia. Check lipid and hepatic panel  #3 recent near syncopal episode probably related to dehydration. No episode since then  #4 mild anemia  recent labs hemoglobin 11.8 with prior baseline around 13. Recheck CBC. If still low check Hemoccults and iron studies #5 health maintenance. No history of documented Pneumovax. We'll go ahead with tetanus booster and Pneumovax

## 2012-04-30 ENCOUNTER — Encounter: Payer: Self-pay | Admitting: Family Medicine

## 2012-04-30 ENCOUNTER — Ambulatory Visit (INDEPENDENT_AMBULATORY_CARE_PROVIDER_SITE_OTHER): Payer: Medicare Other | Admitting: Family Medicine

## 2012-04-30 VITALS — BP 120/70 | Temp 98.3°F | Wt 202.0 lb

## 2012-04-30 DIAGNOSIS — M6740 Ganglion, unspecified site: Secondary | ICD-10-CM

## 2012-04-30 NOTE — Patient Instructions (Addendum)
Ganglion A ganglion is a swelling under the skin that is filled with a thick, jelly-like substance. It is a synovial cyst. This is caused by a break (rupture) of the joint lining from the joint space. A ganglion often occurs near an area of repeated minor trauma (damage caused by an accident). Trauma may also be a repetitive movement at work or in a sport. TREATMENT   It often goes away without treatment. It may reappear later. Sometimes a ganglion may need to be surgically removed. Often they are drained and injected with a steroid. Sometimes they respond to:  Rest.   Splinting.  HOME CARE INSTRUCTIONS    Your caregiver will decide the best way of treating your ganglion. Do not try to break the ganglion yourself by pressing on it, poking it with a needle, or hitting it with a heavy object.   Use medications as directed.  SEEK MEDICAL CARE IF:    The ganglion becomes larger or more painful.   You have increased redness or swelling.   You have weakness or numbness in your hand or wrist.  MAKE SURE YOU:    Understand these instructions.   Will watch your condition.   Will get help right away if you are not doing well or get worse.  Document Released: 07/28/2000 Document Revised: 07/20/2011 Document Reviewed: 09/24/2007 [employer] Patient Information 2012 [employer]. 

## 2012-04-30 NOTE — Progress Notes (Signed)
  Subjective:    Patient ID: John Graham, male    DOB: 12-17-1945, 66 y.o.   MRN: [id number]  HPI  Nonpainful cystic lesion left thumb. Just distal to the interphalangeal joint. Present for several months. Does a lot of typing. No history of injury. No restricted range of motion. Patient tried to stick a needle in this and drain this couple weeks ago but no fluid was expressed. No erythema. No warmth.   Review of Systems  Constitutional: Negative for fever and chills.  Neurological: Negative for weakness and numbness.       Objective:   Physical Exam  Constitutional: He appears well-developed and well-nourished.  Cardiovascular: Normal rate and regular rhythm.   Pulmonary/Chest: Effort normal and breath sounds normal. No respiratory distress. He has no wheezes.  Musculoskeletal:       Left thumb reveals small mobile cystic mass just distal to interphalangeal joint left thumb. Nontender. No warmth or erythema. Nonfluctuant          Assessment & Plan:  Small ganglion cyst left thumb. Reassurance. We explained this could be drained but would likely return. Observe for now.

## 2012-05-08 ENCOUNTER — Other Ambulatory Visit (INDEPENDENT_AMBULATORY_CARE_PROVIDER_SITE_OTHER): Payer: Medicare Other

## 2012-05-08 DIAGNOSIS — Z0000 Encounter for general adult medical examination without abnormal findings: Secondary | ICD-10-CM

## 2012-05-08 LAB — POCT URINALYSIS DIPSTICK
Ketones, UA: NEGATIVE
Nitrite, UA: NEGATIVE
Protein, UA: NEGATIVE
pH, UA: 5.5

## 2012-05-08 LAB — CBC WITH DIFFERENTIAL/PLATELET
Eosinophils Relative: 2.7 % (ref 0.0–5.0)
HCT: 40.5 % (ref 39.0–52.0)
Hemoglobin: 13.5 g/dL (ref 13.0–17.0)
Lymphs Abs: 1.5 10*3/uL (ref 0.7–4.0)
MCV: 103.3 fl — ABNORMAL HIGH (ref 78.0–100.0)
Monocytes Absolute: 0.7 10*3/uL (ref 0.1–1.0)
Neutro Abs: 4.8 10*3/uL (ref 1.4–7.7)
Platelets: 172 10*3/uL (ref 150.0–400.0)
WBC: 7.2 10*3/uL (ref 4.5–10.5)

## 2012-05-08 LAB — HEPATIC FUNCTION PANEL
ALT: 26 U/L (ref 0–53)
AST: 21 U/L (ref 0–37)
Albumin: 3.9 g/dL (ref 3.5–5.2)
Total Bilirubin: 0.7 mg/dL (ref 0.3–1.2)
Total Protein: 6.8 g/dL (ref 6.0–8.3)

## 2012-05-08 LAB — BASIC METABOLIC PANEL
BUN: 22 mg/dL (ref 6–23)
Chloride: 101 mEq/L (ref 96–112)
Glucose, Bld: 94 mg/dL (ref 70–99)
Potassium: 4.5 mEq/L (ref 3.5–5.1)
Sodium: 140 mEq/L (ref 135–145)

## 2012-05-08 LAB — PSA: PSA: 0.57 ng/mL (ref 0.10–4.00)

## 2012-05-08 LAB — LIPID PANEL
Cholesterol: 152 mg/dL (ref 0–200)
VLDL: 16.4 mg/dL (ref 0.0–40.0)

## 2012-05-08 LAB — TSH: TSH: 0.7 u[IU]/mL (ref 0.35–5.50)

## 2012-05-24 ENCOUNTER — Encounter: Payer: Self-pay | Admitting: Family Medicine

## 2012-05-24 ENCOUNTER — Ambulatory Visit (INDEPENDENT_AMBULATORY_CARE_PROVIDER_SITE_OTHER): Payer: Medicare Other | Admitting: Family Medicine

## 2012-05-24 VITALS — BP 110/78 | HR 72 | Temp 98.2°F | Resp 12 | Ht 72.0 in | Wt 206.0 lb

## 2012-05-24 DIAGNOSIS — I10 Essential (primary) hypertension: Secondary | ICD-10-CM

## 2012-05-24 DIAGNOSIS — F419 Anxiety disorder, unspecified: Secondary | ICD-10-CM

## 2012-05-24 DIAGNOSIS — E785 Hyperlipidemia, unspecified: Secondary | ICD-10-CM

## 2012-05-24 DIAGNOSIS — F411 Generalized anxiety disorder: Secondary | ICD-10-CM

## 2012-05-24 DIAGNOSIS — Z23 Encounter for immunization: Secondary | ICD-10-CM

## 2012-05-24 DIAGNOSIS — R718 Other abnormality of red blood cells: Secondary | ICD-10-CM

## 2012-05-24 DIAGNOSIS — Z0000 Encounter for general adult medical examination without abnormal findings: Secondary | ICD-10-CM

## 2012-05-24 MED ORDER — LISINOPRIL-HYDROCHLOROTHIAZIDE 20-12.5 MG PO TABS: 1.0000 | ORAL_TABLET | Freq: Every day | ORAL | Status: DC

## 2012-05-24 MED ORDER — SIMVASTATIN 20 MG PO TABS: 20.0000 mg | ORAL_TABLET | Freq: Every evening | ORAL | Status: DC

## 2012-05-24 MED ORDER — METOPROLOL TARTRATE 25 MG PO TABS: 25.0000 mg | ORAL_TABLET | Freq: Two times a day (BID) | ORAL | Status: DC

## 2012-05-24 MED ORDER — SERTRALINE HCL 50 MG PO TABS: 50.0000 mg | ORAL_TABLET | Freq: Every day | ORAL | Status: DC

## 2012-05-24 NOTE — Progress Notes (Signed)
Subjective:    Patient ID: John Graham, male    DOB: 1946/05/04, 66 y.o.   MRN: [id number]  HPI  Patient seen for wellness exam, acute issues and chronic followup as well. In reviewing preventative issues he had tetanus last year along with Pneumovax. Needs flu vaccine today. Colonoscopy May 2012 with recommended three-year followup. Ongoing nicotine use. Low motivation to quit. Medications reviewed. He remains on simvastatin, metoprolol, and lisinopril HCTZ. No recent chest pains. No dyspnea. No increased cough.  No history of shingles vaccine but has had clinical shingles  Increased job stress. Work from home. He still works about 10 hours per day. Frequently feels anxious and sometimes agitated with stress. This occurs on daily basis. Occasionally down at times but no consistent depression.  Family history -father had abdominal aneurysm. Patient's other risk factors include history of hypertension, smoking status, and age. No prior screening  Past Medical History  Diagnosis Date  . Hyperlipidemia   . Hypertension   . Hypercholesteremia    Past Surgical History  Procedure Date  . Dental surgery     8 dental implants  . Colonoscopy   . Polypectomy     reports that he has been smoking.  He does not have any smokeless tobacco history on file. He reports that he drinks about 2.5 ounces of alcohol per week. He reports that he does not use illicit drugs. family history includes Pancreatic cancer in his mother. No Known Allergies    Review of Systems  Constitutional: Negative for fever, activity change, appetite change and fatigue.  HENT: Negative for ear pain, congestion and trouble swallowing.   Eyes: Negative for pain and visual disturbance.  Respiratory: Negative for cough, shortness of breath and wheezing.   Cardiovascular: Negative for chest pain and palpitations.  Gastrointestinal: Negative for nausea, vomiting, abdominal pain, diarrhea, constipation, blood in stool,  abdominal distention and rectal pain.  Genitourinary: Negative for dysuria, hematuria and testicular pain.  Musculoskeletal: Negative for joint swelling and arthralgias.  Skin: Negative for rash.  Neurological: Negative for dizziness, syncope and headaches.  Hematological: Negative for adenopathy.  Psychiatric/Behavioral: Negative for suicidal ideas, confusion, disturbed wake/sleep cycle and dysphoric mood. The patient is nervous/anxious.        Objective:   Physical Exam  Constitutional: He is oriented to person, place, and time. He appears well-developed and well-nourished. No distress.  HENT:  Head: Normocephalic and atraumatic.  Right Ear: External ear normal.  Left Ear: External ear normal.  Mouth/Throat: Oropharynx is clear and moist.  Eyes: Conjunctivae normal and EOM are normal. Pupils are equal, round, and reactive to light.  Neck: Normal range of motion. Neck supple. No thyromegaly present.  Cardiovascular: Normal rate, regular rhythm and normal heart sounds.   No murmur heard. Pulmonary/Chest: No respiratory distress. He has no wheezes. He has no rales.  Abdominal: Soft. Bowel sounds are normal. He exhibits no distension and no mass. There is no tenderness. There is no rebound and no guarding.  Musculoskeletal: He exhibits no edema.  Lymphadenopathy:    He has no cervical adenopathy.  Neurological: He is alert and oriented to person, place, and time. He displays normal reflexes. No cranial nerve deficit.  Skin: No rash noted.       Multiple seborrheic keratoses. No concerning lesions  Psychiatric: He has a normal mood and affect. His behavior is normal.          Assessment & Plan:  #1 health maintenance. Flu vaccine given. Pneumovax up-to-date. Check  on coverage for shingles vaccine. Colonoscopy up to date. Labs reviewed with patient. These are all basically favorable with exception of elevated MCV of uncertain significance #2 elevated MCV. Normal TSH. Moderate  alcohol consumption which may be issue. Check B12 at followup #3 situational anxiety. Patient has some OCD tendencies. Started sertraline 50 mg once daily and reassess in 3-4 weeks. Reviewed possible side effects #4 at risk for abdominal aneurysm. Risk factors as above. Set up screening #5 hypertension stable and well controlled. Refill medications for one year #6 hyperlipidemia. Lipids adequately controlled. Refill simvastatin for one year

## 2012-05-24 NOTE — Patient Instructions (Addendum)
Check on coverage for shingles vaccine We will check B12 level at follow up.

## 2012-06-14 ENCOUNTER — Encounter (INDEPENDENT_AMBULATORY_CARE_PROVIDER_SITE_OTHER): Payer: Medicare Other

## 2012-06-14 DIAGNOSIS — I10 Essential (primary) hypertension: Secondary | ICD-10-CM

## 2012-06-14 DIAGNOSIS — Z0000 Encounter for general adult medical examination without abnormal findings: Secondary | ICD-10-CM

## 2012-06-17 NOTE — Progress Notes (Signed)
Quick Note:  Pt informed ______ 

## 2012-06-24 ENCOUNTER — Ambulatory Visit (INDEPENDENT_AMBULATORY_CARE_PROVIDER_SITE_OTHER): Payer: Medicare Other | Admitting: Family Medicine

## 2012-06-24 ENCOUNTER — Encounter: Payer: Self-pay | Admitting: Family Medicine

## 2012-06-24 VITALS — BP 110/72 | Temp 98.3°F | Wt 207.0 lb

## 2012-06-24 DIAGNOSIS — F411 Generalized anxiety disorder: Secondary | ICD-10-CM

## 2012-06-24 DIAGNOSIS — F419 Anxiety disorder, unspecified: Secondary | ICD-10-CM

## 2012-06-24 DIAGNOSIS — Z299 Encounter for prophylactic measures, unspecified: Secondary | ICD-10-CM

## 2012-06-24 DIAGNOSIS — Z79899 Other long term (current) drug therapy: Secondary | ICD-10-CM

## 2012-06-24 DIAGNOSIS — D7589 Other specified diseases of blood and blood-forming organs: Secondary | ICD-10-CM

## 2012-06-24 MED ORDER — ZOSTER VACCINE LIVE 19400 UNT/0.65ML ~~LOC~~ SOLR: 0.6500 mL | Freq: Once | SUBCUTANEOUS | Status: DC

## 2012-06-24 NOTE — Progress Notes (Signed)
Subjective:     Patient ID: John Graham, male   DOB: 11-27-1945, 66 y.o.   MRN: [id number]  HPI  66 year old male with history of HTN, hyperlipidemia, and situational anxiety here for 35-month follow-up.    Pt started on sertraline 50mg  daily after last visit for anxiety.  He reports today that this seems to be having good effect, as he notes improved mood and decreased agitation that he had been experiencing.  He also notes decreased libido with sertraline, but that this is tolerable for him.  He completed screening last week for abdominal aortic aneurysm, which was normal.  He was noted to have slightly elevated MCV on most recent labs, with plan to repeat today.  No symptoms of anemia.  Review of Systems  Constitutional: Negative for fatigue.  Respiratory: Negative for chest tightness and shortness of breath.   Cardiovascular: Negative for chest pain.       Objective:   Physical Exam  Constitutional: He is oriented to person, place, and time. He appears well-developed and well-nourished. No distress.  Neurological: He is alert and oriented to person, place, and time.       Assessment:     66 year old male with history of hypertension, hyperlipidemia, situational anxiety and recent elevated MCV here for 24-month follow-up.    Plan:     1. Situational anxiety: sertraline appears to be helping pt significantly with improved mood and decreased agitation.  Continue current dose and follow-up if any change in symptoms. 2. Elevated MCV: not anemic.  Some moderate alcohol consumption, which may be contributing.  Check B12 level today to r/o deficiency.  Reassess when results in. 3. Abdominal aortic aneurysm risk: completed AAA doppler screen last week, results normal, no AAA.   4. Hypertension: currently well-controlled, meds refilled for one year at last viist. 5. Hyperlipidemia: currently well-controlled, meds refilled for one year at last visit.  Marthann Schiller, MS3

## 2012-06-25 NOTE — Progress Notes (Signed)
Quick Note:  Pt informed ______ 

## 2012-09-19 ENCOUNTER — Other Ambulatory Visit: Payer: Self-pay | Admitting: Family Medicine

## 2012-09-19 MED ORDER — SERTRALINE HCL 50 MG PO TABS: 50.0000 mg | ORAL_TABLET | Freq: Every day | ORAL | Status: DC

## 2012-09-19 NOTE — Telephone Encounter (Signed)
Pt is requesting a 90 day supply of SERTRALINE HCL 50 MG to be sent to his pharmacy on file. Please advise.

## 2013-06-13 ENCOUNTER — Telehealth: Payer: Self-pay | Admitting: Family Medicine

## 2013-06-13 MED ORDER — SERTRALINE HCL 50 MG PO TABS: 50.0000 mg | ORAL_TABLET | Freq: Every day | ORAL | Status: DC

## 2013-06-13 NOTE — Telephone Encounter (Signed)
Sent RX to pharmacy 

## 2013-06-13 NOTE — Telephone Encounter (Signed)
Pt needs refill of sertraline (ZOLOFT) 50 MG tablet Pt has made fup for 12/16 [facility] [place], [place]

## 2013-06-17 ENCOUNTER — Other Ambulatory Visit: Payer: Self-pay | Admitting: Family Medicine

## 2013-07-29 ENCOUNTER — Encounter: Payer: Self-pay | Admitting: Family Medicine

## 2013-07-29 ENCOUNTER — Ambulatory Visit (INDEPENDENT_AMBULATORY_CARE_PROVIDER_SITE_OTHER): Payer: Medicare Other | Admitting: Family Medicine

## 2013-07-29 VITALS — BP 128/72 | HR 62 | Temp 97.8°F | Wt 214.0 lb

## 2013-07-29 DIAGNOSIS — E785 Hyperlipidemia, unspecified: Secondary | ICD-10-CM

## 2013-07-29 DIAGNOSIS — F411 Generalized anxiety disorder: Secondary | ICD-10-CM

## 2013-07-29 DIAGNOSIS — I10 Essential (primary) hypertension: Secondary | ICD-10-CM

## 2013-07-29 DIAGNOSIS — M1710 Unilateral primary osteoarthritis, unspecified knee: Secondary | ICD-10-CM

## 2013-07-29 LAB — HEPATIC FUNCTION PANEL
AST: 23 U/L (ref 0–37)
Albumin: 4.1 g/dL (ref 3.5–5.2)
Alkaline Phosphatase: 56 U/L (ref 39–117)
Total Bilirubin: 1.2 mg/dL (ref 0.3–1.2)

## 2013-07-29 LAB — LIPID PANEL
HDL: 51.1 mg/dL (ref >39.00)
Triglycerides: 213 mg/dL — ABNORMAL HIGH (ref 0.0–149.0)
VLDL: 42.6 mg/dL — ABNORMAL HIGH (ref 0.0–40.0)

## 2013-07-29 LAB — BASIC METABOLIC PANEL
Calcium: 9.4 mg/dL (ref 8.4–10.5)
GFR: 110.22 mL/min (ref >60.00)
Potassium: 3.7 mEq/L (ref 3.5–5.1)
Sodium: 137 mEq/L (ref 135–145)

## 2013-07-29 NOTE — Progress Notes (Signed)
   Subjective:    Patient ID: John Graham, male    DOB: 10/24/1945, 67 y.o.   MRN: [id number]  HPI Patient seen for medical followup. He has ongoing nicotine use, hypertension, hyperlipidemia, chronic anxiety, osteoarthritis. He rarely takes Motrin for his osteoarthritis. Blood pressure treated with lisinopril HCTZ and metoprolol. Takes simvastatin for hyperlipidemia. Compliant with all medications. Denies any side effects. Blood pressures been stable by home readings. No recent chest pains. No dyspnea. No dizziness. No history of any CAD or known peripheral vascular disease. No claudication symptoms.  Past Medical History  Diagnosis Date  . Hyperlipidemia   . Hypertension   . Hypercholesteremia    Past Surgical History  Procedure Laterality Date  . Dental surgery      8 dental implants  . Colonoscopy    . Polypectomy      reports that he has been smoking.  He does not have any smokeless tobacco history on file. He reports that he drinks about 2.5 ounces of alcohol per week. He reports that he does not use illicit drugs. family history includes Pancreatic cancer in his mother. No Known Allergies    Review of Systems  Constitutional: Negative for fatigue and unexpected weight change.  HENT: Negative for trouble swallowing.   Eyes: Negative for visual disturbance.  Respiratory: Negative for cough, chest tightness and shortness of breath.   Cardiovascular: Negative for chest pain, palpitations and leg swelling.  Endocrine: Negative for polydipsia and polyuria.  Neurological: Negative for dizziness, syncope, weakness, light-headedness and headaches.       Objective:   Physical Exam  Constitutional: He is oriented to person, place, and time. He appears well-developed and well-nourished.  HENT:  Right Ear: External ear normal.  Left Ear: External ear normal.  Mouth/Throat: Oropharynx is clear and moist.  Eyes: Pupils are equal, round, and reactive to light.  Neck: Neck  supple. No thyromegaly present.  Cardiovascular: Normal rate and regular rhythm.   Pulmonary/Chest: Effort normal and breath sounds normal. No respiratory distress. He has no wheezes. He has no rales.  Musculoskeletal: He exhibits no edema.  Neurological: He is alert and oriented to person, place, and time.          Assessment & Plan:  #1 hypertension. Stable at goal. Continue current medications. Check basic metabolic panel #2 hyperlipidemia. Check lipid and hepatic panel #3 chronic anxiety. Stable on sertraline. He is very reluctant to discontinue. #4 health maintenance. Smoking cessation discussed. He has low motivation to quit. Flu vaccine already given.

## 2013-07-29 NOTE — Progress Notes (Signed)
Pre visit review using our clinic review tool, if applicable. No additional management support is needed unless otherwise documented below in the visit note. 

## 2013-07-30 LAB — LDL CHOLESTEROL, DIRECT: Direct LDL: 110 mg/dL

## 2013-08-15 ENCOUNTER — Other Ambulatory Visit: Payer: Self-pay | Admitting: Family Medicine

## 2013-09-21 ENCOUNTER — Other Ambulatory Visit: Payer: Self-pay | Admitting: Family Medicine

## 2013-11-11 ENCOUNTER — Telehealth: Payer: Self-pay | Admitting: Family Medicine

## 2013-11-11 NOTE — Telephone Encounter (Signed)
Requesting refill of sertraline (ZOLOFT) 50 MG tablet

## 2013-11-12 MED ORDER — SERTRALINE HCL 50 MG PO TABS: ORAL_TABLET | ORAL | Status: DC

## 2013-11-12 NOTE — Telephone Encounter (Signed)
RX sent to pharmacy  

## 2013-12-08 ENCOUNTER — Encounter: Payer: Self-pay | Admitting: Internal Medicine

## 2014-01-22 ENCOUNTER — Encounter: Payer: Self-pay | Admitting: Internal Medicine

## 2014-01-22 ENCOUNTER — Encounter: Payer: Self-pay | Admitting: Family Medicine

## 2014-01-22 ENCOUNTER — Ambulatory Visit (INDEPENDENT_AMBULATORY_CARE_PROVIDER_SITE_OTHER): Payer: Medicare Other | Admitting: Family Medicine

## 2014-01-22 VITALS — BP 112/70 | HR 71 | Temp 98.4°F | Wt 210.0 lb

## 2014-01-22 DIAGNOSIS — R21 Rash and other nonspecific skin eruption: Secondary | ICD-10-CM

## 2014-01-22 DIAGNOSIS — M25569 Pain in unspecified knee: Secondary | ICD-10-CM

## 2014-01-22 DIAGNOSIS — M25561 Pain in right knee: Secondary | ICD-10-CM

## 2014-01-22 NOTE — Progress Notes (Signed)
Pre visit review using our clinic review tool, if applicable. No additional management support is needed unless otherwise documented below in the visit note. 

## 2014-01-22 NOTE — Progress Notes (Signed)
   Subjective:    Patient ID: John Graham, male    DOB: May 18, 1946, 68 y.o.   MRN: [id number]  Knee Pain    Patient here for the following issues  About 2 months but he fell landed on right lateral knee. Since that time had some knee pain but especially proximal tibial pain. Symptoms are intermittent worse with walking. He had very small effusion. No locking or giving way. Has been taking Motrin 800 mg sporadically. No prior history of knee injury.  Patient relates onset a few months ago of scaly lesions or circumferential including right chest wall nasal and involving both ears. Minimally pruritic. No history of psoriasis. No other skin rashes reported. No alleviating factors. No clear exacerbating factors.  Past Medical History  Diagnosis Date  . Hyperlipidemia   . Hypertension   . Hypercholesteremia    Past Surgical History  Procedure Laterality Date  . Dental surgery      8 dental implants  . Colonoscopy    . Polypectomy      reports that he has been smoking.  He does not have any smokeless tobacco history on file. He reports that he drinks about 2.5 ounces of alcohol per week. He reports that he does not use illicit drugs. family history includes Pancreatic cancer in his mother. No Known Allergies    Review of Systems  Constitutional: Negative for fever, chills, appetite change and unexpected weight change.  Cardiovascular: Negative for chest pain.  Gastrointestinal: Negative for abdominal pain.  Musculoskeletal: Positive for arthralgias.  Skin: Positive for rash.  Hematological: Negative for adenopathy. Does not bruise/bleed easily.       Objective:   Physical Exam  Constitutional: He appears well-developed and well-nourished.  Cardiovascular: Normal rate and regular rhythm.   Pulmonary/Chest: Effort normal and breath sounds normal.  Musculoskeletal:  Right knee reveals small effusion. No ecchymosis. No warmth. No erythema. No medial or lateral jointline  tenderness. No reproducible tenderness in the leg. He has full range of motion. Ligament testing is stable  Skin:  Patient has scattered skin lesions involving mid aspect left pinna, right earlobe, right and left naris and right chest wall just above the nipple area. Largest lesion is right chest wall which is 2.5 x 2 cm. Erythematous base with well demarcated scaly surface          Assessment & Plan:  Right knee pain and leg pain following fall 2 months ago. Does have small effusion but no localized bony tenderness around the knee.  No previous x-rays. Start with x-rays right knee. Reassess Monday. He'll continue with ibuprofen and knee brace  Skin rash which is somewhat psoriasiform in appearance-but not a typical distribution for psoriasis. No extensor surface involvement and no scalp involvement. Schedule biopsy

## 2014-01-23 ENCOUNTER — Other Ambulatory Visit: Payer: Self-pay | Admitting: Family Medicine

## 2014-01-23 ENCOUNTER — Ambulatory Visit (INDEPENDENT_AMBULATORY_CARE_PROVIDER_SITE_OTHER)
Admission: RE | Admit: 2014-01-23 | Discharge: 2014-01-23 | Disposition: A | Discharge status: Home / Self Care | Payer: Medicare Other | Source: Ambulatory Visit | Attending: Family Medicine | Admitting: Family Medicine

## 2014-01-23 ENCOUNTER — Telehealth: Payer: Self-pay | Admitting: Family Medicine

## 2014-01-23 DIAGNOSIS — M25561 Pain in right knee: Secondary | ICD-10-CM

## 2014-01-23 DIAGNOSIS — M25569 Pain in unspecified knee: Secondary | ICD-10-CM

## 2014-01-23 NOTE — Telephone Encounter (Signed)
Relevant patient education assigned to patient using Emmi. ° °

## 2014-01-26 ENCOUNTER — Ambulatory Visit (INDEPENDENT_AMBULATORY_CARE_PROVIDER_SITE_OTHER): Payer: Medicare Other | Admitting: Family Medicine

## 2014-01-26 ENCOUNTER — Encounter: Payer: Self-pay | Admitting: Family Medicine

## 2014-01-26 VITALS — BP 130/78 | HR 71 | Temp 98.5°F | Wt 210.0 lb

## 2014-01-26 DIAGNOSIS — H6121 Impacted cerumen, right ear: Secondary | ICD-10-CM

## 2014-01-26 DIAGNOSIS — M1710 Unilateral primary osteoarthritis, unspecified knee: Secondary | ICD-10-CM

## 2014-01-26 DIAGNOSIS — IMO0002 Reserved for concepts with insufficient information to code with codable children: Secondary | ICD-10-CM

## 2014-01-26 DIAGNOSIS — H6120 Impacted cerumen, unspecified ear: Secondary | ICD-10-CM

## 2014-01-26 DIAGNOSIS — L989 Disorder of the skin and subcutaneous tissue, unspecified: Secondary | ICD-10-CM

## 2014-01-26 DIAGNOSIS — M1711 Unilateral primary osteoarthritis, right knee: Secondary | ICD-10-CM

## 2014-01-26 NOTE — Progress Notes (Signed)
   Subjective:    Patient ID: John Graham, male    DOB: 1946-01-20, 68 y.o.   MRN: [id number]  HPI Here for several issues:   Recent right knee pains. No specific injury. X-ray revealed only mild degenerative changes. His pain is actually somewhat better compared to last week. No instability.  Patient has some right ear fullness. No drainage. No associated pain. No dizziness.  Patient presented for consideration for biopsy of right chest wall rash. Also has some thick scaly areas of rash including posterior to left ear and posterior right ear. Refer to last note: "Patient relates onset a few months ago of scaly lesions or circumferential including right chest wall nasal and involving both ears. Minimally pruritic. No history of psoriasis. No other skin rashes reported. No alleviating factors. No clear exacerbating factors."  Past Medical History  Diagnosis Date  . Hyperlipidemia   . Hypertension   . Hypercholesteremia    Past Surgical History  Procedure Laterality Date  . Dental surgery      8 dental implants  . Colonoscopy    . Polypectomy      reports that he has been smoking.  He does not have any smokeless tobacco history on file. He reports that he drinks about 2.5 ounces of alcohol per week. He reports that he does not use illicit drugs. family history includes Pancreatic cancer in his mother. No Known Allergies      Review of Systems  Constitutional: Negative for fever, chills, appetite change and unexpected weight change.  Skin: Positive for rash.  Hematological: Negative for adenopathy. Does not bruise/bleed easily.       Objective:   Physical Exam  Constitutional: He appears well-developed and well-nourished.  HENT:  Left Ear: External ear normal.  Right ear canal is full of debris and cerumen. Irrigation with removal of cerumen.  Cardiovascular: Normal rate and regular rhythm.   Pulmonary/Chest: Effort normal and breath sounds normal. No respiratory  distress. He has no wheezes. He has no rales.  Skin: Rash noted.  Right anterior chest superior to the areole region he has a thick plaque-like rash with erythematous base and silvery whitish scale near porta the surface. This covers an area about 3 x 3 and half centimeters          Assessment & Plan:  #1 rash involving right chest wall and somewhat similar lesions posterior to the right left ear. Question psoriasis. We discussed risk and benefits of biopsy. Patient consented. Prepped the skin with Betadine. Anesthesia with 1% plain Xylocaine. Using sterile technique 4 mm punch biopsy obtained through right chest wall lesion. Minimal bleeding. Closed with 2 sutures of 4-0 Ethilon. Antibiotic and dressing applied. Wound care instruction given. Specimen sent to pathology for further evaluation #2 cerumen right ear canal. Irrigation with removal of cerumen #3 right knee pain slightly improved since last visit. X-rays revealed only mild degenerative changes. He does not have significant effusion this time. We've recommended further observation

## 2014-01-26 NOTE — Patient Instructions (Signed)
Keep wound dry for the first 24 hours then clean daily with soap and water for one week. Apply topical antibiotic daily for 3-4 days. Keep covered with clean dressing for 4-5 days. Follow up promptly for any signs of infection such as redness, warmth, pain, or drainage. Follow up in 8-10 days for suture removal.

## 2014-01-26 NOTE — Progress Notes (Signed)
Pre visit review using our clinic review tool, if applicable. No additional management support is needed unless otherwise documented below in the visit note. 

## 2014-01-27 ENCOUNTER — Telehealth: Payer: Self-pay | Admitting: Family Medicine

## 2014-01-27 NOTE — Telephone Encounter (Signed)
Relevant patient education assigned to patient using Emmi. ° °

## 2014-01-30 ENCOUNTER — Other Ambulatory Visit: Payer: Self-pay | Admitting: *Deleted

## 2014-01-30 MED ORDER — DESOXIMETASONE 0.25 % EX CREA: 1.0000 "application " | TOPICAL_CREAM | Freq: Two times a day (BID) | CUTANEOUS | Status: DC

## 2014-02-03 ENCOUNTER — Encounter: Payer: Self-pay | Admitting: Family Medicine

## 2014-02-03 ENCOUNTER — Ambulatory Visit (INDEPENDENT_AMBULATORY_CARE_PROVIDER_SITE_OTHER): Payer: Medicare Other | Admitting: Family Medicine

## 2014-02-03 VITALS — BP 130/78 | HR 63 | Wt 210.0 lb

## 2014-02-03 DIAGNOSIS — L308 Other specified dermatitis: Secondary | ICD-10-CM | POA: Insufficient documentation

## 2014-02-03 DIAGNOSIS — L408 Other psoriasis: Secondary | ICD-10-CM

## 2014-02-03 NOTE — Progress Notes (Signed)
   Subjective:    Patient ID: John Graham, male    DOB: [birth year], 68 y.o.   MRN: [id number]  Suture / Staple Removal   Patient seen for followup regarding rash. Refer to prior note  He had thick plaque-like rash mostly right upper anterior chest. This was biopsied and biopsy came back consistent with psoriasis. We started Topicort 0.25% cream which is already helping somewhat. He is not aware of any family history of psoriasis. He does not have typical distribution of extensor surfaces of elbows or knees. Minimal associated pruritus.  "Skin , right chest wall  PSORIASIFORM DERMATITIS, COMPATIBLE WITH PSORIASIS Microscopic Description There is regular acanthosis of the epidermis with parakeratosis. There is a superficial mononuclear cell infiltrate without appreciable atypia. There are scattered neutrophils in the parakeratin with an attenuated granular layer in these foci. The features are those of a psoriasiform dermatitis. Although this could be another psoriasiform process, the pattern is compatible with psoriasis. A periodic acid-Schiff (PAS) stain with diastase predigestion is negative for fungal hyphae."   Past Medical History  Diagnosis Date  . Hyperlipidemia   . Hypertension   . Hypercholesteremia    Past Surgical History  Procedure Laterality Date  . Dental surgery      8 dental implants  . Colonoscopy    . Polypectomy      reports that he has been smoking.  He does not have any smokeless tobacco history on file. He reports that he drinks about 2.5 ounces of alcohol per week. He reports that he does not use illicit drugs. family history includes Pancreatic cancer in his mother. No Known Allergies    Review of Systems  Constitutional: Negative for fever and chills.  Skin: Positive for rash.       Objective:   Physical Exam  Constitutional: He appears well-developed and well-nourished.  Cardiovascular: Normal rate and regular rhythm.   Pulmonary/Chest:  Effort normal and breath sounds normal. No respiratory distress. He has no wheezes. He has no rales.  Skin:  2 sutures are removed from biopsy site right upper chest wall. No signs of infection. Rash is already improving. Previously noted thick scale has resolved. He has less erythema. Less inflamed          Assessment & Plan:  Psoriasis. We discussed that this is something that can be treated but not cured. Has already seen some improvement with the steroid cream.  Continue topical steroid as needed. We discussed possible exacerbating factors.

## 2014-02-03 NOTE — Progress Notes (Signed)
Pre visit review using our clinic review tool, if applicable. No additional management support is needed unless otherwise documented below in the visit note. 

## 2014-02-03 NOTE — Patient Instructions (Signed)
Psoriasis Psoriasis is a common, long-lasting (chronic) inflammation of the skin. It affects both men and women equally, of all ages and all races. Psoriasis cannot be passed from person to person (not contagious). Psoriasis varies from mild to very severe. When severe, it can greatly affect your quality of life. Psoriasis is an inflammatory disorder affecting the skin as well as other organs including the joints (causing an arthritis). With psoriasis, the skin sheds its top layer of cells more rapidly than it does in someone without psoriasis. CAUSES  The cause of psoriasis is largely unknown. Genetics, your immune system, and the environment seem to play a role in causing psoriasis. Factors that can make psoriasis worse include:  Damage or trauma to the skin, such as cuts, scrapes, and sunburn. This damage often causes new areas of psoriasis (lesions).  Winter dryness and lack of sunlight.  Medicines such as lithium, beta-blockers, antimalarial drugs, ACE inhibitors, nonsteroidal anti-inflammatory drugs (ibuprofen, aspirin), and terbinafine. Let your caregiver know if you are taking any of these drugs.  Alcohol. Excessive alcohol use should be avoided if you have psoriasis. Drinking large amounts of alcohol can affect:  How well your psoriasis treatment works.  How safe your psoriasis treatment is.  Smoking. If you smoke, ask your caregiver for help to quit.  Stress.  Bacterial or viral infections.  Arthritis. Arthritis associated with psoriasis (psoriatic arthritis) affects less than 10% of patients with psoriasis. The arthritic intensity does not always match the skin psoriasis intensity. It is important to let your caregiver know if your joints hurt or if they are stiff. SYMPTOMS  The most common form of psoriasis begins with little red bumps that gradually become larger. The bumps begin to form scales that flake off easily. The lower layers of scales stick together. When these scales  are scratched or removed, the underlying skin is tender and bleeds easily. These areas then grow in size and may become large. Psoriasis often creates a rash that looks the same on both sides of the body (symmetrical). It often affects the elbows, knees, groin, genitals, arms, legs, scalp, and nails. Affected nails often have pitting, loosen, thicken, crumble, and are difficult to treat.  "Inverse psoriasis"occurs in the armpits, under breasts, in skin folds, and around the groin, buttocks, and genitals.  "Guttate psoriasis" generally occurs in children and young adults following a recent sore throat (strep throat). It begins with many small, red, scaly spots on the skin. It clears spontaneously in weeks or a few months without treatment. DIAGNOSIS  Psoriasis is diagnosed by physical exam. A tissue sample (biopsy) may also be taken. TREATMENT The treatment of psoriasis depends on your age, health, and living conditions.  Steroid (cortisone) creams, lotions, and ointments may be used. These treatments are associated with thinning of the skin, blood vessels that get larger (dilated), loss of skin pigmentation, and easy bruising. It is important to use these steroids as directed by your caregiver. Only treat the affected areas and not the normal, unaffected skin. People on long-term steroid treatment should wear a medical alert bracelet. Injections may be used in areas that are difficult to treat.  Scalp treatments are available as shampoos, solutions, sprays, foams, and oils. Avoid scratching the scalp and picking at the scales.  Anthralin medicine works well on areas that are difficult to treat. However, it stains clothes and skin and may cause temporary irritation.  Synthetic vitamin D (calcipotriene)can be used on small areas. It is available by prescription. The forms   of synthetic vitamin D available in health food stores do not help with psoriasis.  Coal tarsare available in various strengths  for psoriasis that is difficult to treat. They are one of the longest used treatments for difficult to treat psoriasis. However, they are messy to use.  Light therapy (UV therapy) can be carefully and professionally monitored in a dermatologist's office. Careful sunbathing is helpful for many people as directed by your caregiver. The exposure should be just long enough to cause a mild redness (erythema) of your skin. Avoid sunburn as this may make the condition worse. Sunscreen (SPF of 30 or higher) should be used to protect against sunburn. Cataracts, wrinkles, and skin aging are some of the harmful side effects of light therapy.  If creams (topical medicines) fail, there are several other options for systemic or oral medicines your caregiver can suggest. Psoriasis can sometimes be very difficult to treat. It can come and go. It is necessary to follow up with your caregiver regularly if your psoriasis is difficult to treat. Usually, with persistence you can get a good amount of relief. Maintaining consistent care is important. Do not change caregivers just because you do not see immediate results. It may take several trials to find the right combination of treatment for you. PREVENTING FLARE-UPS  Wear gloves while you wash dishes, while cleaning, and when you are outside in the cold.  If you have radiators, place a bowl of water or damp towel on the radiator. This will help put water back in the air. You can also use a humidifier to keep the air moist. Try to keep the humidity at about 60% in your home.  Apply moisturizer while your skin is still damp from bathing or showering. This traps water in the skin.  Avoid long, hot baths or showers. Keep soap use to a minimum. Soaps dry out the skin and wash away the protective oils. Use a fragrance free, dye free soap.  Drink enough water and fluids to keep your urine clear or pale yellow. Not drinking enough water depletes your skin's water  supply.  Turn off the heat at night and keep it low during the day. Cool air is less drying. SEEK MEDICAL CARE IF:  You have increasing pain in the affected areas.  You have uncontrolled bleeding in the affected areas.  You have increasing redness or warmth in the affected areas.  You start to have pain or stiffness in your joints.  You start feeling depressed about your condition.  You have a fever. Document Released: 07/28/2000 Document Revised: 10/23/2011 Document Reviewed: 01/23/2011 ExitCare Patient Information 2015 [employer]. This information is not intended to replace advice given to you by your health care provider. Make sure you discuss any questions you have with your health care provider.  

## 2014-03-10 ENCOUNTER — Ambulatory Visit (AMBULATORY_SURGERY_CENTER): Payer: Self-pay | Admitting: *Deleted

## 2014-03-10 VITALS — Ht 71.0 in | Wt 207.8 lb

## 2014-03-10 DIAGNOSIS — Z86010 Personal history of colonic polyps: Secondary | ICD-10-CM

## 2014-03-10 MED ORDER — MOVIPREP 100 G PO SOLR: 1.0000 | Freq: Once | ORAL | Status: DC

## 2014-03-10 NOTE — Progress Notes (Signed)
No egg or soy allergy. No anesthesia problems.  No home O2.  No diet meds.  

## 2014-03-16 ENCOUNTER — Encounter: Payer: Self-pay | Admitting: Internal Medicine

## 2014-03-16 ENCOUNTER — Ambulatory Visit (AMBULATORY_SURGERY_CENTER): Payer: Medicare Other | Admitting: Internal Medicine

## 2014-03-16 VITALS — BP 130/79 | HR 60 | Temp 97.0°F | Resp 18 | Ht 71.0 in | Wt 207.0 lb

## 2014-03-16 DIAGNOSIS — D126 Benign neoplasm of colon, unspecified: Secondary | ICD-10-CM

## 2014-03-16 DIAGNOSIS — Z86010 Personal history of colonic polyps: Secondary | ICD-10-CM

## 2014-03-16 MED ORDER — SODIUM CHLORIDE 0.9 % IV SOLN: 500.0000 mL | INTRAVENOUS | Status: DC

## 2014-03-16 NOTE — Progress Notes (Signed)
Called to room to assist during endoscopic procedure.  Patient ID and intended procedure confirmed with present staff. Received instructions for my participation in the procedure from the performing physician.  

## 2014-03-16 NOTE — Progress Notes (Signed)
A/ox3, pleased with MAC, report to RN 

## 2014-03-16 NOTE — Patient Instructions (Signed)
YOU HAD AN ENDOSCOPIC PROCEDURE TODAY AT THE [facility]: Refer to the procedure report that was given to you for any specific questions about what was found during the examination.  If the procedure report does not answer your questions, please call your gastroenterologist to clarify.  If you requested that your care partner not be given the details of your procedure findings, then the procedure report has been included in a sealed envelope for you to review at your convenience later.  YOU SHOULD EXPECT: Some feelings of bloating in the abdomen. Passage of more gas than usual.  Walking can help get rid of the air that was put into your GI tract during the procedure and reduce the bloating. If you had a lower endoscopy (such as a colonoscopy or flexible sigmoidoscopy) you may notice spotting of blood in your stool or on the toilet paper. If you underwent a bowel prep for your procedure, then you may not have a normal bowel movement for a few days.  DIET: Your first meal following the procedure should be a light meal and then it is ok to progress to your normal diet.  A half-sandwich or bowl of soup is an example of a good first meal.  Heavy or fried foods are harder to digest and may make you feel nauseous or bloated.  Likewise meals heavy in dairy and vegetables can cause extra gas to form and this can also increase the bloating.  Drink plenty of fluids but you should avoid alcoholic beverages for 24 hours.  ACTIVITY: Your care partner should take you home directly after the procedure.  You should plan to take it easy, moving slowly for the rest of the day.  You can resume normal activity the day after the procedure however you should NOT DRIVE or use heavy machinery for 24 hours (because of the sedation medicines used during the test).    SYMPTOMS TO REPORT IMMEDIATELY: A gastroenterologist can be reached at any hour.  During normal business hours, 8:30 AM to 5:00 PM Monday through Friday,  call [phone].  After hours and on weekends, please call the GI answering service at [phone] who will take a message and have the physician on call contact you.   Following lower endoscopy (colonoscopy or flexible sigmoidoscopy):  Excessive amounts of blood in the stool  Significant tenderness or worsening of abdominal pains  Swelling of the abdomen that is new, acute  Fever of 100F or higher    FOLLOW UP: If any biopsies were taken you will be contacted by phone or by letter within the next 1-3 weeks.  Call your gastroenterologist if you have not heard about the biopsies in 3 weeks.  Our staff will call the home number listed on your records the next business day following your procedure to check on you and address any questions or concerns that you may have at that time regarding the information given to you following your procedure. This is a courtesy call and so if there is no answer at the home number and we have not heard from you through the emergency physician on call, we will assume that you have returned to your regular daily activities without incident.  SIGNATURES/CONFIDENTIALITY: You and/or your care partner have signed paperwork which will be entered into your electronic medical record.  These signatures attest to the fact that that the information above on your After Visit Summary has been reviewed and is understood.  Full responsibility of the confidentiality   of this discharge information lies with you and/or your care-partner.  Polyp, diverticulosis, and high fiber diet information given.  Repeat colonoscopy 3 years-2018.

## 2014-03-16 NOTE — Progress Notes (Signed)
Waiting for Dr. Henrene Pastor.

## 2014-03-16 NOTE — Op Note (Signed)
[place]  [address] [place], [id number]   COLONOSCOPY PROCEDURE REPORT  PATIENT: John Graham, John Graham  MR#: [id number] BIRTHDATE: September 03, 1945 , 68  yrs. old GENDER: Male ENDOSCOPIST: Eustace Quail, MD REFERRED BS:[id number] Program Recall PROCEDURE DATE:  03/16/2014 PROCEDURE:   Colonoscopy with snare polypectomy x6 First Screening Colonoscopy - Avg.  risk and is 50 yrs.  old or older - No.  Prior Negative Screening - Now for repeat screening. N/A  History of Adenoma - Now for follow-up colonoscopy & has been > or = to 3 yrs.  Yes hx of adenoma.  Has been 3 or more years since last colonoscopy.  Polyps Removed Today? Yes. ASA CLASS:   Class II INDICATIONS:Patient's personal history of adenomatous colon polyps. Previous examinations in 2007 and April 2012 with multiple polyps (adenomatous and hyperplastic; small and large). MEDICATIONS: MAC sedation, administered by CRNA and propofol (Diprivan) 330mg  IV  DESCRIPTION OF PROCEDURE:   After the risks benefits and alternatives of the procedure were thoroughly explained, informed consent was obtained.  A digital rectal exam revealed no abnormalities of the rectum.   The LB [id number]  endoscope was introduced through the anus and advanced to the cecum, which was identified by both the appendix and ileocecal valve. No adverse events experienced.   The quality of the prep was good, using MoviPrep  The instrument was then slowly withdrawn as the colon was fully examined.  COLON FINDINGS: A semi-pedunculated polyp measuring 20 mm in size was found at the cecum.  A polypectomy was performed using snare cautery.  The resection was complete and the polyp tissue was completely retrieved.   Five polyps ranging between 3-79mm in size were found in the ascending , transverse , and descending.  A polypectomy was performed with a cold snare.  The resection was complete and the polyp tissue was completely retrieved.    Severe diverticulosis was noted in the left colon.   The colon mucosa was otherwise normal.  Retroflexed views revealed internal hemorrhoids. The time to cecum=2 minutes 49 seconds.  Withdrawal time=29 minutes 31 seconds.  The scope was withdrawn and the procedure completed. COMPLICATIONS: There were no complications.  ENDOSCOPIC IMPRESSION: 1.   Semi-pedunculated polyp measuring 20 mm in size was in the cecum; polypectomy was performed using snare cautery 2.   Five polyps were found in the ascending, transverse,and descending colon; polypectomy was performed with a cold snare 3.   Severe diverticulosis was noted in the left colon 4.   The colon mucosa was otherwise normal  RECOMMENDATIONS: 1. Repeat Colonoscopy in 3 years.   eSigned:  Eustace Quail, MD 03/16/2014 2:00 PM   cc: Carolann Littler, MD and The Patient   PATIENT NAME:  John Graham, John Graham MR#: [id number]

## 2014-03-17 ENCOUNTER — Telehealth: Payer: Self-pay | Admitting: *Deleted

## 2014-03-17 NOTE — Telephone Encounter (Signed)
  Follow up Call-  Call back number 03/16/2014  Post procedure Call Back phone  # [phone]  Permission to leave phone message Yes     Patient questions:  Do you have a fever, pain , or abdominal swelling? No. Pain Score  0 *  Have you tolerated food without any problems? Yes.    Have you been able to return to your normal activities? Yes.    Do you have any questions about your discharge instructions: Diet   No. Medications  No. Follow up visit  No.  Do you have questions or concerns about your Care? No.  Actions: * If pain score is 4 or above: No action needed, pain <4.

## 2014-03-31 ENCOUNTER — Encounter: Payer: Self-pay | Admitting: Internal Medicine

## 2014-04-24 ENCOUNTER — Encounter: Payer: Self-pay | Admitting: Family Medicine

## 2014-04-24 ENCOUNTER — Ambulatory Visit (INDEPENDENT_AMBULATORY_CARE_PROVIDER_SITE_OTHER): Payer: Medicare Other | Admitting: Family Medicine

## 2014-04-24 VITALS — BP 132/80 | HR 78 | Temp 98.1°F | Wt 211.0 lb

## 2014-04-24 DIAGNOSIS — Z23 Encounter for immunization: Secondary | ICD-10-CM

## 2014-04-24 DIAGNOSIS — H6120 Impacted cerumen, unspecified ear: Secondary | ICD-10-CM

## 2014-04-24 DIAGNOSIS — H6123 Impacted cerumen, bilateral: Secondary | ICD-10-CM

## 2014-04-24 MED ORDER — MOMETASONE FUROATE 0.1 % EX SOLN: Freq: Every day | CUTANEOUS | Status: DC

## 2014-04-24 NOTE — Patient Instructions (Signed)
Cerumen Impaction °A cerumen impaction is when the wax in your ear forms a plug. This plug usually causes reduced hearing. Sometimes it also causes an earache or dizziness. Removing a cerumen impaction can be difficult and painful. The wax sticks to the ear canal. The canal is sensitive and bleeds easily. If you try to remove a heavy wax buildup with a cotton tipped swab, you may push it in further. °Irrigation with water, suction, and small ear curettes may be used to clear out the wax. If the impaction is fixed to the skin in the ear canal, ear drops may be needed for a few days to loosen the wax. People who build up a lot of wax frequently can use ear wax removal products available in your local drugstore. °SEEK MEDICAL CARE IF:  °You develop an earache, increased hearing loss, or marked dizziness. °Document Released: 09/07/2004 Document Revised: 10/23/2011 Document Reviewed: 10/28/2009 °ExitCare® Patient Information ©2015 [employer]. This information is not intended to replace advice given to you by your health care provider. Make sure you discuss any questions you have with your health care provider. ° °

## 2014-04-24 NOTE — Progress Notes (Signed)
   Subjective:    Patient ID: John Graham, male    DOB: 1946/08/13, 68 y.o.   MRN: [id number]  Ear Fullness    Acute visit. One-week history of right ear pressure. No drainage. No vertigo. Denies any fever or chills. He states this is the way it used to feel when he flew frequently (barotitis media). He has not had any recent change of altitude or flights.  Past Medical History  Diagnosis Date  . Hyperlipidemia   . Hypertension   . Hypercholesteremia   . Anxiety    Past Surgical History  Procedure Laterality Date  . Dental surgery      8 dental implants  . Colonoscopy    . Polypectomy      reports that he has been smoking.  He has never used smokeless tobacco. He reports that he drinks about 2.5 ounces of alcohol per week. He reports that he does not use illicit drugs. family history includes Pancreatic cancer in his mother. There is no history of Colon cancer. No Known Allergies    Review of Systems  Constitutional: Negative for fever and chills.       Objective:   Physical Exam  Constitutional: He appears well-developed and well-nourished.  HENT:  Mouth/Throat: Oropharynx is clear and moist.  He has some very dry cerumen left canal which is partially removed with curette. This is nonoccluding. Right eardrum reveals some soft-appearing wax deep within the canal. We removed small amount along the exterior aspect with curette          Assessment & Plan:  Cerumen impaction.  Removed with irrigation with normal appearing TM.

## 2014-04-24 NOTE — Progress Notes (Signed)
Pre visit review using our clinic review tool, if applicable. No additional management support is needed unless otherwise documented below in the visit note. 

## 2014-05-26 ENCOUNTER — Telehealth: Payer: Self-pay | Admitting: Family Medicine

## 2014-05-26 NOTE — Telephone Encounter (Signed)
Patient is requesting Dr. Elease Hashimoto to refer him to an ear, nose and throat specialist. He said Dr. Elease Hashimoto has cleaned his right ear but he is still having problems hearing out of that ear. He would like a specialist that is covered under his [place] [employer] insurance.

## 2014-05-27 ENCOUNTER — Other Ambulatory Visit: Payer: Self-pay | Admitting: Family Medicine

## 2014-05-27 DIAGNOSIS — H9209 Otalgia, unspecified ear: Secondary | ICD-10-CM

## 2014-05-27 NOTE — Telephone Encounter (Signed)
OK to set up.  He will need to confirm who is covered under his insurance.

## 2014-05-27 NOTE — Telephone Encounter (Signed)
Referral is ordered

## 2014-06-23 ENCOUNTER — Other Ambulatory Visit: Payer: Self-pay | Admitting: Family Medicine

## 2014-08-12 ENCOUNTER — Other Ambulatory Visit: Payer: Self-pay | Admitting: Family Medicine

## 2014-10-13 ENCOUNTER — Other Ambulatory Visit: Payer: Self-pay | Admitting: Family Medicine

## 2015-02-23 DIAGNOSIS — H02834 Dermatochalasis of left upper eyelid: Secondary | ICD-10-CM | POA: Diagnosis not present

## 2015-02-23 DIAGNOSIS — D2211 Melanocytic nevi of right eyelid, including canthus: Secondary | ICD-10-CM | POA: Diagnosis not present

## 2015-02-23 DIAGNOSIS — H2513 Age-related nuclear cataract, bilateral: Secondary | ICD-10-CM | POA: Diagnosis not present

## 2015-02-23 DIAGNOSIS — H02831 Dermatochalasis of right upper eyelid: Secondary | ICD-10-CM | POA: Diagnosis not present

## 2015-03-23 ENCOUNTER — Other Ambulatory Visit: Payer: Self-pay | Admitting: Family Medicine

## 2015-04-02 ENCOUNTER — Other Ambulatory Visit: Payer: Self-pay | Admitting: Family Medicine

## 2015-04-06 ENCOUNTER — Ambulatory Visit (INDEPENDENT_AMBULATORY_CARE_PROVIDER_SITE_OTHER): Payer: Medicare Other | Admitting: Family Medicine

## 2015-04-06 ENCOUNTER — Encounter: Payer: Self-pay | Admitting: Family Medicine

## 2015-04-06 VITALS — BP 130/78 | HR 60 | Temp 97.9°F | Ht 71.0 in | Wt 209.0 lb

## 2015-04-06 DIAGNOSIS — I10 Essential (primary) hypertension: Secondary | ICD-10-CM

## 2015-04-06 DIAGNOSIS — Z0000 Encounter for general adult medical examination without abnormal findings: Secondary | ICD-10-CM

## 2015-04-06 DIAGNOSIS — Z23 Encounter for immunization: Secondary | ICD-10-CM

## 2015-04-06 DIAGNOSIS — Z125 Encounter for screening for malignant neoplasm of prostate: Secondary | ICD-10-CM | POA: Diagnosis not present

## 2015-04-06 LAB — BASIC METABOLIC PANEL
BUN: 20 mg/dL (ref 6–23)
CALCIUM: 9.8 mg/dL (ref 8.4–10.5)
CO2: 36 meq/L — AB (ref 19–32)
CREATININE: 0.85 mg/dL (ref 0.40–1.50)
Chloride: 100 mEq/L (ref 96–112)
GFR: 94.92 mL/min (ref >60.00)
Glucose, Bld: 98 mg/dL (ref 70–99)
Potassium: 4.7 mEq/L (ref 3.5–5.1)
Sodium: 142 mEq/L (ref 135–145)

## 2015-04-06 LAB — LIPID PANEL
Cholesterol: 177 mg/dL (ref 0–200)
HDL: 55.1 mg/dL (ref >39.00)
NONHDL: 121.45
Total CHOL/HDL Ratio: 3
Triglycerides: 257 mg/dL — ABNORMAL HIGH (ref 0.0–149.0)
VLDL: 51.4 mg/dL — ABNORMAL HIGH (ref 0.0–40.0)

## 2015-04-06 LAB — LDL CHOLESTEROL, DIRECT: Direct LDL: 89 mg/dL

## 2015-04-06 LAB — CBC WITH DIFFERENTIAL/PLATELET
BASOS ABS: 0 10*3/uL (ref 0.0–0.1)
Basophils Relative: 0.6 % (ref 0.0–3.0)
EOS PCT: 4.7 % (ref 0.0–5.0)
Eosinophils Absolute: 0.3 10*3/uL (ref 0.0–0.7)
HCT: 41.7 % (ref 39.0–52.0)
Hemoglobin: 14.3 g/dL (ref 13.0–17.0)
Lymphocytes Relative: 26.1 % (ref 12.0–46.0)
Lymphs Abs: 1.8 10*3/uL (ref 0.7–4.0)
MCHC: 34.2 g/dL (ref 30.0–36.0)
MCV: 103.3 fl — AB (ref 78.0–100.0)
Monocytes Absolute: 0.8 10*3/uL (ref 0.1–1.0)
Monocytes Relative: 11.5 % (ref 3.0–12.0)
Neutro Abs: 3.9 10*3/uL (ref 1.4–7.7)
Neutrophils Relative %: 57.1 % (ref 43.0–77.0)
Platelets: 173 10*3/uL (ref 150.0–400.0)
RBC: 4.04 Mil/uL — AB (ref 4.22–5.81)
RDW: 13.3 % (ref 11.5–15.5)
WBC: 6.9 10*3/uL (ref 4.0–10.5)

## 2015-04-06 LAB — HEPATIC FUNCTION PANEL
ALBUMIN: 4.1 g/dL (ref 3.5–5.2)
ALK PHOS: 88 U/L (ref 39–117)
ALT: 18 U/L (ref 0–53)
AST: 21 U/L (ref 0–37)
Bilirubin, Direct: 0.1 mg/dL (ref 0.0–0.3)
TOTAL PROTEIN: 7 g/dL (ref 6.0–8.3)
Total Bilirubin: 0.6 mg/dL (ref 0.2–1.2)

## 2015-04-06 LAB — PSA: PSA: 0.3 ng/mL (ref 0.10–4.00)

## 2015-04-06 LAB — TSH: TSH: 0.52 u[IU]/mL (ref 0.35–4.50)

## 2015-04-06 NOTE — Progress Notes (Signed)
Pre visit review using our clinic review tool, if applicable. No additional management support is needed unless otherwise documented below in the visit note. 

## 2015-04-06 NOTE — Progress Notes (Signed)
Subjective:    Patient ID: John Graham, male    DOB: 07/07/1946, 69 y.o.   MRN: [id number]  HPI  Patient here for complete physical. He has history of ongoing nicotine use, hyperlipidemia, hypertension , osteoarthritis, colon polyps, psoriasis. Still smokes about half pack cigarettes per day. Colonoscopy 2015 with polypectomy. Recommended 3 year follow-up. Tetanus up-to-date. Needs Prevnar 13. Otherwise immunizations up-to-date.   Patient has not had abdominal aneurysm screen. He does meet criteria in terms of age, and risk factors of smoking , hypertension, and family history (Fa=AAA)  Also meets criteria for low dose CT lung cancer screen.  Past Medical History  Diagnosis Date  . Hyperlipidemia   . Hypertension   . Hypercholesteremia   . Anxiety    Past Surgical History  Procedure Laterality Date  . Dental surgery      8 dental implants  . Colonoscopy    . Polypectomy      reports that he has been smoking.  He has never used smokeless tobacco. He reports that he drinks about 2.5 oz of alcohol per week. He reports that he does not use illicit drugs. family history includes Pancreatic cancer in his mother. There is no history of Colon cancer. No Known Allergies    Review of Systems  Constitutional: Negative for fever, activity change, appetite change and fatigue.  HENT: Negative for congestion, ear pain and trouble swallowing.   Eyes: Negative for pain and visual disturbance.  Respiratory: Negative for cough and wheezing.   Cardiovascular: Negative for chest pain and palpitations.  Gastrointestinal: Negative for nausea, vomiting, abdominal pain, diarrhea, constipation, blood in stool, abdominal distention and rectal pain.  Endocrine: Negative for polydipsia and polyuria.  Genitourinary: Negative for dysuria, hematuria and testicular pain.  Musculoskeletal: Negative for joint swelling and arthralgias.  Skin: Negative for rash.  Neurological: Negative for dizziness,  syncope and headaches.  Hematological: Negative for adenopathy.  Psychiatric/Behavioral: Negative for confusion and dysphoric mood.       Objective:   Physical Exam  Constitutional: He is oriented to person, place, and time. He appears well-developed and well-nourished. No distress.  HENT:  Head: Normocephalic and atraumatic.  Right Ear: External ear normal.  Left Ear: External ear normal.  Mouth/Throat: Oropharynx is clear and moist.  Eyes: Conjunctivae and EOM are normal. Pupils are equal, round, and reactive to light.  Neck: Normal range of motion. Neck supple. No thyromegaly present.  Cardiovascular: Normal rate, regular rhythm and normal heart sounds.   No murmur heard. Pulmonary/Chest: No respiratory distress. He has no wheezes. He has no rales.  Abdominal: Soft. Bowel sounds are normal. He exhibits no distension and no mass. There is no tenderness. There is no rebound and no guarding.  Musculoskeletal: He exhibits no edema.  Lymphadenopathy:    He has no cervical adenopathy.  Neurological: He is alert and oriented to person, place, and time. He displays normal reflexes. No cranial nerve deficit.  Skin: No rash noted.  Psychiatric: He has a normal mood and affect.          Assessment & Plan:   Complete physical. Smoking cessation discussed. Obtain screening lab work. Prevnar 13 given. We did discuss pros and cons of low-dose CT lung cancer screening- and he is interested. He does meet criteria. We also discussed abdominal aortic aneurysm screening and he also meets criteria for that (RFs- age, HTN, Smoker, +FH)  The natural history of prostate cancer and ongoing controversy regarding screening and potential treatment outcomes  of prostate cancer has been discussed with the patient. The meaning of a false positive PSA and a false negative PSA has been discussed. He indicates understanding of the limitations of this screening test and wishes to proceed with screening PSA  testing.

## 2015-04-06 NOTE — Patient Instructions (Signed)
Smoking Cessation Quitting smoking is important to your health and has many advantages. However, it is not always easy to quit since nicotine is a very addictive drug. Oftentimes, people try 3 times or more before being able to quit. This document explains the best ways for you to prepare to quit smoking. Quitting takes hard work and a lot of effort, but you can do it. ADVANTAGES OF QUITTING SMOKING  You will live longer, feel better, and live better.  Your body will feel the impact of quitting smoking almost immediately.  Within 20 minutes, blood pressure decreases. Your pulse returns to its normal level.  After 8 hours, carbon monoxide levels in the blood return to normal. Your oxygen level increases.  After 24 hours, the chance of having a heart attack starts to decrease. Your breath, hair, and body stop smelling like smoke.  After 48 hours, damaged nerve endings begin to recover. Your sense of taste and smell improve.  After 72 hours, the body is virtually free of nicotine. Your bronchial tubes relax and breathing becomes easier.  After 2 to 12 weeks, lungs can hold more air. Exercise becomes easier and circulation improves.  The risk of having a heart attack, stroke, cancer, or lung disease is greatly reduced.  After 1 year, the risk of coronary heart disease is cut in half.  After 5 years, the risk of stroke falls to the same as a nonsmoker.  After 10 years, the risk of lung cancer is cut in half and the risk of other cancers decreases significantly.  After 15 years, the risk of coronary heart disease drops, usually to the level of a nonsmoker.  If you are pregnant, quitting smoking will improve your chances of having a healthy baby.  The people you live with, especially any children, will be healthier.  You will have extra money to spend on things other than cigarettes. QUESTIONS TO THINK ABOUT BEFORE ATTEMPTING TO QUIT You may want to talk about your answers with your  health care provider.  Why do you want to quit?  If you tried to quit in the past, what helped and what did not?  What will be the most difficult situations for you after you quit? How will you plan to handle them?  Who can help you through the tough times? Your family? Friends? A health care provider?  What pleasures do you get from smoking? What ways can you still get pleasure if you quit? Here are some questions to ask your health care provider:  How can you help me to be successful at quitting?  What medicine do you think would be best for me and how should I take it?  What should I do if I need more help?  What is smoking withdrawal like? How can I get information on withdrawal? GET READY  Set a quit date.  Change your environment by getting rid of all cigarettes, ashtrays, matches, and lighters in your home, car, or work. Do not let people smoke in your home.  Review your past attempts to quit. Think about what worked and what did not. GET SUPPORT AND ENCOURAGEMENT You have a better chance of being successful if you have help. You can get support in many ways.  Tell your family, friends, and coworkers that you are going to quit and need their support. Ask them not to smoke around you.  Get individual, group, or telephone counseling and support. Programs are available at local hospitals and health centers. Call   your local health department for information about programs in your area.  Spiritual beliefs and practices may help some smokers quit.  Download a "quit meter" on your computer to keep track of quit statistics, such as how long you have gone without smoking, cigarettes not smoked, and money saved.  Get a self-help book about quitting smoking and staying off tobacco. LEARN NEW SKILLS AND BEHAVIORS  Distract yourself from urges to smoke. Talk to someone, go for a walk, or occupy your time with a task.  Change your normal routine. Take a different route to work.  Drink tea instead of coffee. Eat breakfast in a different place.  Reduce your stress. Take a hot bath, exercise, or read a book.  Plan something enjoyable to do every day. Reward yourself for not smoking.  Explore interactive web-based programs that specialize in helping you quit. GET MEDICINE AND USE IT CORRECTLY Medicines can help you stop smoking and decrease the urge to smoke. Combining medicine with the above behavioral methods and support can greatly increase your chances of successfully quitting smoking.  Nicotine replacement therapy helps deliver nicotine to your body without the negative effects and risks of smoking. Nicotine replacement therapy includes nicotine gum, lozenges, inhalers, nasal sprays, and skin patches. Some may be available over-the-counter and others require a prescription.  Antidepressant medicine helps people abstain from smoking, but how this works is unknown. This medicine is available by prescription.  Nicotinic receptor partial agonist medicine simulates the effect of nicotine in your brain. This medicine is available by prescription. Ask your health care provider for advice about which medicines to use and how to use them based on your health history. Your health care provider will tell you what side effects to look out for if you choose to be on a medicine or therapy. Carefully read the information on the package. Do not use any other product containing nicotine while using a nicotine replacement product.  RELAPSE OR DIFFICULT SITUATIONS Most relapses occur within the first 3 months after quitting. Do not be discouraged if you start smoking again. Remember, most people try several times before finally quitting. You may have symptoms of withdrawal because your body is used to nicotine. You may crave cigarettes, be irritable, feel very hungry, cough often, get headaches, or have difficulty concentrating. The withdrawal symptoms are only temporary. They are strongest  when you first quit, but they will go away within 10-14 days. To reduce the chances of relapse, try to:  Avoid drinking alcohol. Drinking lowers your chances of successfully quitting.  Reduce the amount of caffeine you consume. Once you quit smoking, the amount of caffeine in your body increases and can give you symptoms, such as a rapid heartbeat, sweating, and anxiety.  Avoid smokers because they can make you want to smoke.  Do not let weight gain distract you. Many smokers will gain weight when they quit, usually less than 10 pounds. Eat a healthy diet and stay active. You can always lose the weight gained after you quit.  Find ways to improve your mood other than smoking. FOR MORE INFORMATION  www.smokefree.gov  Document Released: 07/25/2001 Document Revised: 12/15/2013 Document Reviewed: 11/09/2011 Exit[employer] Patient Information 2015 [employer]. This information is not intended to replace advice given to you by your health care provider. Make sure you discuss any questions you have with your health care provider.  

## 2015-04-20 ENCOUNTER — Other Ambulatory Visit: Payer: Self-pay | Admitting: Acute Care

## 2015-04-20 ENCOUNTER — Other Ambulatory Visit: Payer: Self-pay | Admitting: Family Medicine

## 2015-04-20 DIAGNOSIS — F17210 Nicotine dependence, cigarettes, uncomplicated: Principal | ICD-10-CM

## 2015-04-23 ENCOUNTER — Other Ambulatory Visit: Payer: Self-pay | Admitting: Family Medicine

## 2015-04-23 DIAGNOSIS — Z87891 Personal history of nicotine dependence: Secondary | ICD-10-CM

## 2015-04-23 DIAGNOSIS — Z0000 Encounter for general adult medical examination without abnormal findings: Secondary | ICD-10-CM

## 2015-04-23 DIAGNOSIS — Z139 Encounter for screening, unspecified: Secondary | ICD-10-CM

## 2015-04-27 ENCOUNTER — Ambulatory Visit (HOSPITAL_COMMUNITY)
Admission: RE | Admit: 2015-04-27 | Discharge: 2015-04-27 | Disposition: A | Discharge status: Home / Self Care | Payer: Medicare Other | Source: Ambulatory Visit | Attending: Cardiology | Admitting: Cardiology

## 2015-04-27 DIAGNOSIS — Z87891 Personal history of nicotine dependence: Secondary | ICD-10-CM | POA: Diagnosis not present

## 2015-04-27 DIAGNOSIS — Z139 Encounter for screening, unspecified: Secondary | ICD-10-CM | POA: Insufficient documentation

## 2015-04-27 DIAGNOSIS — I10 Essential (primary) hypertension: Secondary | ICD-10-CM | POA: Diagnosis not present

## 2015-04-27 DIAGNOSIS — Z0000 Encounter for general adult medical examination without abnormal findings: Secondary | ICD-10-CM | POA: Diagnosis not present

## 2015-04-27 DIAGNOSIS — E785 Hyperlipidemia, unspecified: Secondary | ICD-10-CM | POA: Diagnosis not present

## 2015-04-30 ENCOUNTER — Encounter: Payer: Self-pay | Admitting: Acute Care

## 2015-04-30 ENCOUNTER — Ambulatory Visit (INDEPENDENT_AMBULATORY_CARE_PROVIDER_SITE_OTHER)
Admission: RE | Admit: 2015-04-30 | Discharge: 2015-04-30 | Disposition: A | Discharge status: Home / Self Care | Payer: Medicare Other | Source: Ambulatory Visit | Attending: Acute Care | Admitting: Acute Care

## 2015-04-30 ENCOUNTER — Ambulatory Visit (INDEPENDENT_AMBULATORY_CARE_PROVIDER_SITE_OTHER): Payer: Medicare Other | Admitting: Acute Care

## 2015-04-30 DIAGNOSIS — F17210 Nicotine dependence, cigarettes, uncomplicated: Secondary | ICD-10-CM

## 2015-04-30 DIAGNOSIS — Z87891 Personal history of nicotine dependence: Secondary | ICD-10-CM | POA: Diagnosis not present

## 2015-04-30 NOTE — Progress Notes (Signed)
Shared Decision Making Visit Lung Cancer Screening Program [id number])   Eligibility:  Age 69 y.o.  Pack Years Smoking History Calculation 53 (# packs/per year x # years smoked)  Recent History of coughing up blood  no  Unexplained weight loss? no ( >Than 15 pounds within the last 6 months )  Prior History Lung / other cancer no (Diagnosis within the last 5 years already requiring surveillance chest CT Scans).  Smoking Status Current Smoker Former Smokers: Years since quit:NA  Visit Components:  Discussion included one or more decision making aids. yes  Discussion included risk/benefits of screening. yes  Discussion included potential follow up diagnostic testing for abnormal scans. yes  Discussion included meaning and risk of over diagnosis. yes  Discussion included meaning and risk of False Positives. yes  Discussion included meaning of total radiation exposure. yes  Counseling Included:  Importance of adherence to annual lung cancer LDCT screening. yes  Impact of comorbidities on ability to participate in the program. yes  Ability and willingness to under diagnostic treatment. yes  Smoking Cessation Counseling:  Current Smokers:   Discussed importance of smoking cessation. yes  Information about tobacco cessation classes and interventions provided to patient. yes  Patient provided with "ticket" for LDCT Scan. yes  Symptomatic Patient. no  Counseling:NA  Diagnosis Code: Tobacco Use Z72.0  Asymptomatic Patient yes  Counseling (Intermediate counseling: > three minutes counseling) Q7622  Former Smokers:   Discussed the importance of maintaining cigarette abstinence. NA  Diagnosis Code: Personal History of Nicotine Dependence. Q33.354  Information about tobacco cessation classes and interventions provided to patient. Yes  Patient provided with "ticket" for LDCT Scan. yes  Written Order for Lung Cancer Screening with LDCT placed in Epic. Yes (CT Chest  Lung Cancer Screening Low Dose W/O CM) [id number] Z12.2-Screening of respiratory organs Z87.891-Personal history of nicotine dependence   I spent 15 minutes of face to face time with Mr. John Graham discussing the risks and benefits of lung cancer screening.We viewed a power point together , stopping at intervals to allow for questions to be asked and answered and for topics to be explained to endure understanding. We discussed that the single most powerful action that he can take to decrease his risk of developing lung cancer is to quit smoking. He is currently not ready to quit. I have asked him to pick a date and let me know when he is ready to quit, and that I will do whatever I can to help him achieve the goal of quitting.I have given him the Be stronger than your excuses card with numbers and  resources within the community to help him to quit smoking.I have told him  we can support him through the process and get him into smoking cessation classes when he is ready. We discussed time and location of the scan. I have also given him a copy of the power point we viewed as a reference for questions. He also has my contact information and card for follow up with me as needed. I told him I would call with results either this afternoon or Tuesday afternoon. He verbalized understanding of all of the above and had no further questions upon leaving the office.   Magdalen Spatz, NP

## 2015-05-04 ENCOUNTER — Telehealth: Payer: Self-pay | Admitting: Acute Care

## 2015-05-04 NOTE — Telephone Encounter (Signed)
I called to give John Graham the results of his scan. I explained that his scan was read as a Lung RADS 2, nodules with a very low likelihood of becoming a clinically active cancer based on size and lack of growth.Benign in appearance and behavior. I explained that the recommendation was for a follow up scan in 12 months, which I told him I would call him to schedule in 12 months. He verbalized understanding of the results and had no further questions for me. He has my contact information in the event he has any further questions in the future.

## 2015-06-08 ENCOUNTER — Other Ambulatory Visit: Payer: Self-pay | Admitting: Family Medicine

## 2015-06-22 ENCOUNTER — Other Ambulatory Visit: Payer: Self-pay | Admitting: Family Medicine

## 2015-06-23 ENCOUNTER — Encounter: Payer: Self-pay | Admitting: Gastroenterology

## 2015-06-24 ENCOUNTER — Other Ambulatory Visit: Payer: Self-pay | Admitting: Family Medicine

## 2015-06-24 MED ORDER — LISINOPRIL-HYDROCHLOROTHIAZIDE 20-12.5 MG PO TABS: 1.0000 | ORAL_TABLET | Freq: Every day | ORAL | Status: DC

## 2015-06-24 MED ORDER — SIMVASTATIN 20 MG PO TABS: ORAL_TABLET | ORAL | Status: DC

## 2015-06-24 MED ORDER — METOPROLOL TARTRATE 25 MG PO TABS: ORAL_TABLET | ORAL | Status: DC

## 2015-06-24 NOTE — Telephone Encounter (Signed)
Rx sent 

## 2015-06-24 NOTE — Telephone Encounter (Signed)
Pt needs refill on metoprolol  25 mg #90, simvastatin 20 mg #90 and lisinopril-hctz 20-12.5 mg #90 w/refills.[facility]

## 2015-06-24 NOTE — Telephone Encounter (Signed)
Please clarify what med is being requested.

## 2015-06-28 NOTE — Telephone Encounter (Signed)
Disregard med was pick up

## 2015-09-06 ENCOUNTER — Other Ambulatory Visit: Payer: Self-pay | Admitting: Family Medicine

## 2015-11-03 ENCOUNTER — Other Ambulatory Visit: Payer: Self-pay | Admitting: Acute Care

## 2015-11-03 DIAGNOSIS — F17210 Nicotine dependence, cigarettes, uncomplicated: Principal | ICD-10-CM

## 2015-12-06 ENCOUNTER — Other Ambulatory Visit: Payer: Self-pay | Admitting: Family Medicine

## 2015-12-20 ENCOUNTER — Other Ambulatory Visit: Payer: Self-pay | Admitting: Family Medicine

## 2015-12-27 ENCOUNTER — Other Ambulatory Visit: Payer: Self-pay | Admitting: Family Medicine

## 2016-03-07 ENCOUNTER — Other Ambulatory Visit: Payer: Self-pay | Admitting: Emergency Medicine

## 2016-03-07 MED ORDER — SERTRALINE HCL 50 MG PO TABS: 50.0000 mg | ORAL_TABLET | Freq: Every day | ORAL | 0 refills | Status: DC

## 2016-03-14 ENCOUNTER — Encounter: Payer: Self-pay | Admitting: Family Medicine

## 2016-03-14 ENCOUNTER — Ambulatory Visit (INDEPENDENT_AMBULATORY_CARE_PROVIDER_SITE_OTHER): Payer: Medicare Other | Admitting: Family Medicine

## 2016-03-14 VITALS — BP 100/60 | HR 66 | Temp 98.7°F | Ht 71.0 in | Wt 194.0 lb

## 2016-03-14 DIAGNOSIS — R911 Solitary pulmonary nodule: Secondary | ICD-10-CM

## 2016-03-14 DIAGNOSIS — I10 Essential (primary) hypertension: Secondary | ICD-10-CM

## 2016-03-14 DIAGNOSIS — F411 Generalized anxiety disorder: Secondary | ICD-10-CM | POA: Diagnosis not present

## 2016-03-14 DIAGNOSIS — E785 Hyperlipidemia, unspecified: Secondary | ICD-10-CM | POA: Diagnosis not present

## 2016-03-14 LAB — BASIC METABOLIC PANEL
BUN: 35 mg/dL — ABNORMAL HIGH (ref 6–23)
CALCIUM: 9.7 mg/dL (ref 8.4–10.5)
CO2: 27 mEq/L (ref 19–32)
CREATININE: 1.58 mg/dL — AB (ref 0.40–1.50)
Chloride: 105 mEq/L (ref 96–112)
GFR: 46.29 mL/min — AB (ref >60.00)
Glucose, Bld: 96 mg/dL (ref 70–99)
Potassium: 4.7 mEq/L (ref 3.5–5.1)
Sodium: 139 mEq/L (ref 135–145)

## 2016-03-14 LAB — LIPID PANEL
CHOLESTEROL: 133 mg/dL (ref 0–200)
HDL: 31.7 mg/dL — ABNORMAL LOW (ref >39.00)
LDL Cholesterol: 81 mg/dL (ref 0–99)
NonHDL: 101
TRIGLYCERIDES: 99 mg/dL (ref 0.0–149.0)
Total CHOL/HDL Ratio: 4
VLDL: 19.8 mg/dL (ref 0.0–40.0)

## 2016-03-14 LAB — HEPATIC FUNCTION PANEL
ALK PHOS: 109 U/L (ref 39–117)
ALT: 8 U/L (ref 0–53)
AST: 11 U/L (ref 0–37)
Albumin: 4.1 g/dL (ref 3.5–5.2)
BILIRUBIN DIRECT: 0.1 mg/dL (ref 0.0–0.3)
TOTAL PROTEIN: 7.1 g/dL (ref 6.0–8.3)
Total Bilirubin: 0.4 mg/dL (ref 0.2–1.2)

## 2016-03-14 NOTE — Progress Notes (Signed)
Subjective:     Patient ID: John Graham, male   DOB: [birth year], 70 y.o.   MRN: [id number]  HPI Patient here for medical follow-up Has history of hypertension, osteoarthritis, chronic anxiety, hyperlipidemia, benign colon polyps. He quit drinking alcohol completely last year and has made some significant dietary changes. He's lost some weight due to his efforts mostly by reducing portion size. Overall feels well. Has long history of smoking. Low-dose CT chest screening last year benign appearing 2.5 mm subpleural nodule right upper lobe. Recommended one year screening  He had abdominal aortic aneurysm screening last year which was normal  Medications reviewed. Compliant with all. No dizziness. No headaches. No chest pains.  Past Medical History:  Diagnosis Date  . Anxiety   . Hypercholesteremia   . Hyperlipidemia   . Hypertension    Past Surgical History:  Procedure Laterality Date  . COLONOSCOPY    . DENTAL SURGERY     8 dental implants  . POLYPECTOMY      reports that he has been smoking Cigarettes.  He has a 53.00 pack-year smoking history. He has never used smokeless tobacco. He reports that he drinks about 3.0 oz of alcohol per week . He reports that he does not use drugs. family history includes Cancer in his father; Pancreatic cancer in his mother. No Known Allergies    Review of Systems  Constitutional: Negative for appetite change, fatigue and unexpected weight change.  Eyes: Negative for visual disturbance.  Respiratory: Negative for cough, chest tightness and shortness of breath.   Cardiovascular: Negative for chest pain, palpitations and leg swelling.  Gastrointestinal: Negative for abdominal pain.  Genitourinary: Negative for dysuria.  Neurological: Negative for dizziness, syncope, weakness, light-headedness and headaches.       Objective:   Physical Exam  Constitutional: He is oriented to person, place, and time. He appears well-developed and  well-nourished.  Neck: Neck supple. No thyromegaly present.  Cardiovascular: Normal rate and regular rhythm.   Pulmonary/Chest: Effort normal and breath sounds normal. No respiratory distress. He has no wheezes. He has no rales.  Musculoskeletal: He exhibits no edema.  Lymphadenopathy:    He has no cervical adenopathy.  Neurological: He is alert and oriented to person, place, and time. No cranial nerve deficit.       Assessment:     #1 hypertension. Very well controlled  #2 dyslipidemia  #3 ongoing nicotine use   #4 history of solitary subpleural nodule right upper lobe last year on low-dose CT lung cancer screening  #5 osteoarthritis involving the knees  #6 history of anxiety controlled on sertraline      Plan:     -Check labs with lipid panel, hepatic panel, and basic metabolic panel -Order repeat CT low-dose lung cancer screening -Is encouraged to quit smoking -Routine follow-up in 6 months and sooner as needed  [name] MD [facility]

## 2016-03-14 NOTE — Patient Instructions (Signed)
We will call you regarding low dose CT of lung to re-evaluate right lung nodule.

## 2016-03-18 IMAGING — CT CT CHEST LUNG CANCER SCREENING LOW DOSE W/O CM
2 of 4 series · 15 of 40 positions shown, 18 images · non-contrast
Comparison: None.

CLINICAL DATA: 69-year-old current smoker with 53 pack-year history
of smoking, for initial lung cancer screening

EXAM:
CT CHEST WITHOUT CONTRAST
TECHNIQUE: Multidetector CT imaging of the chest was performed following the
standard protocol without IV contrast.

[Series 2: chest routine with · axial · 0.80mm/px · z∈[+18,+273]mm · 12 of 57 slices shown, 15 images]
[im 3/57  mediastinal]
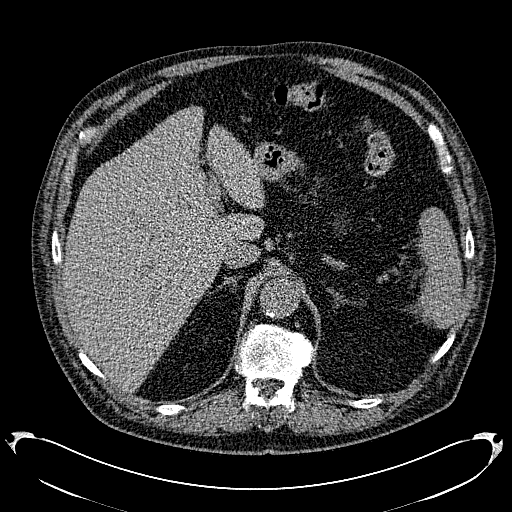
[im 3/57  lung]
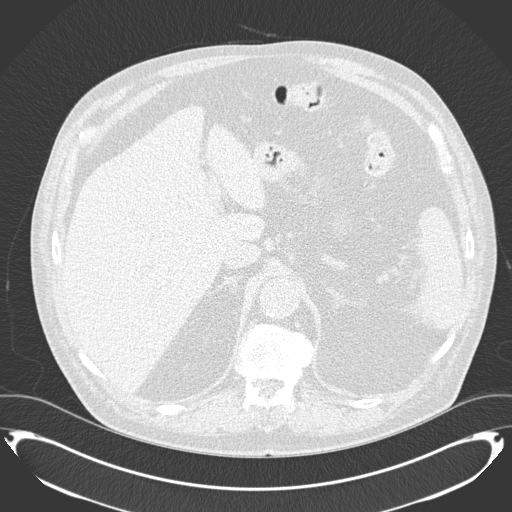
[im 8/57  lung]
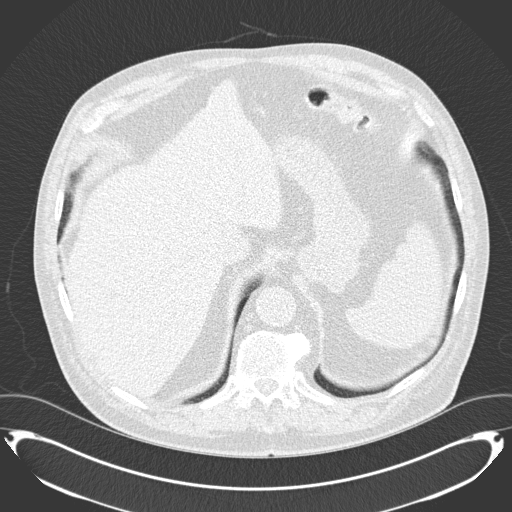
[im 13/57  lung]
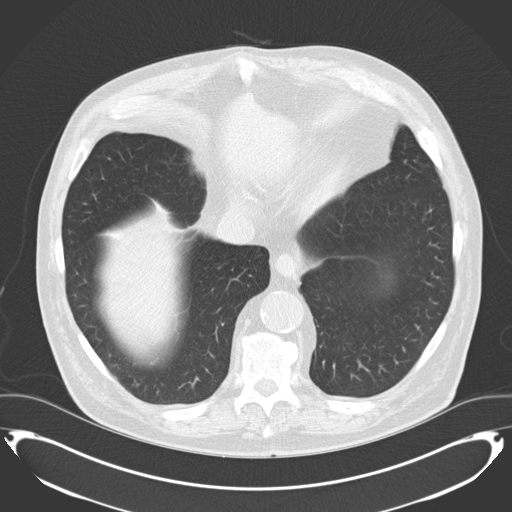
[im 18/57  lung]
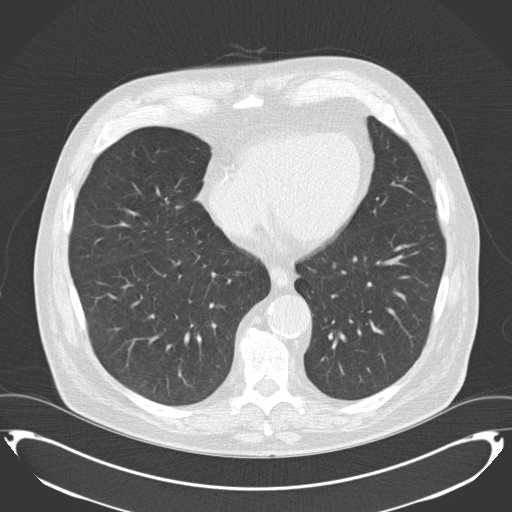
[im 21/57  mediastinal]
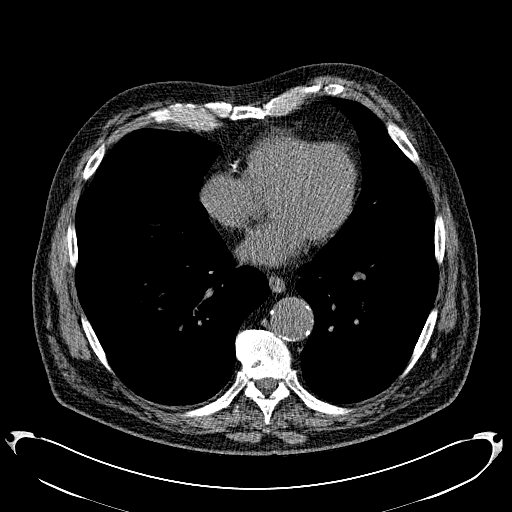
[im 21/57  lung]
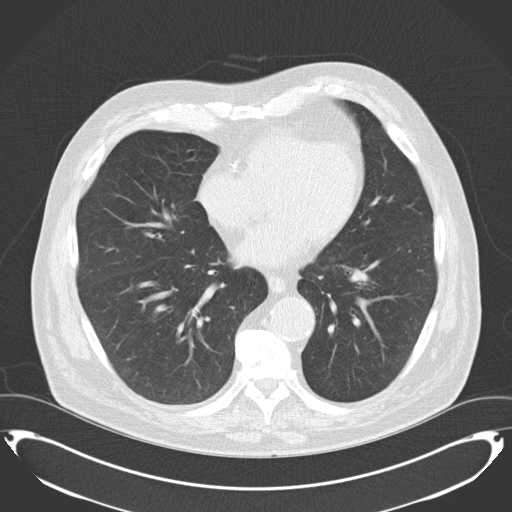
[im 26/57  lung]
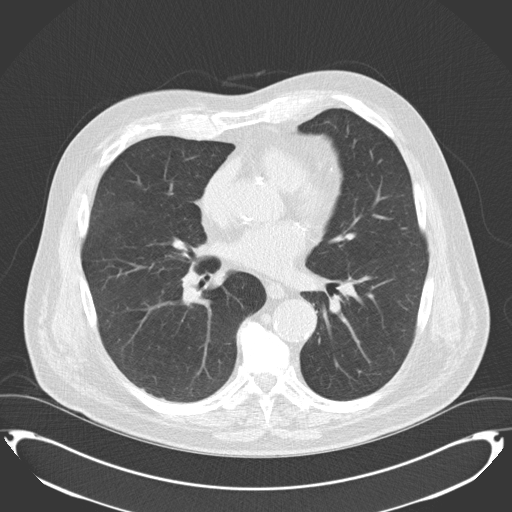
[im 31/57  lung]
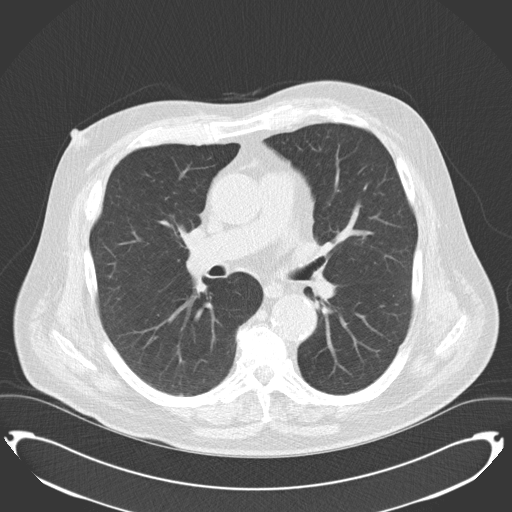
[im 36/57  lung]
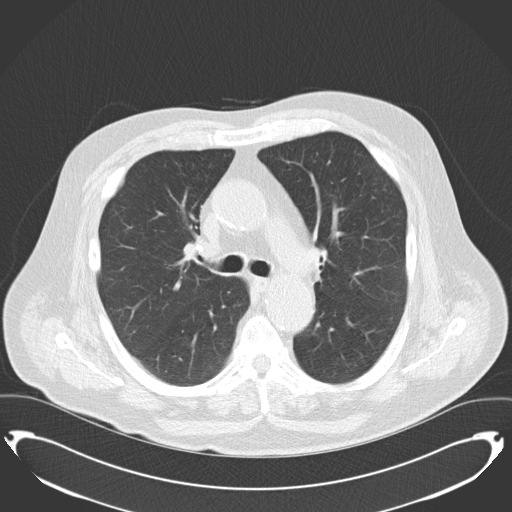
[im 39/57  mediastinal]
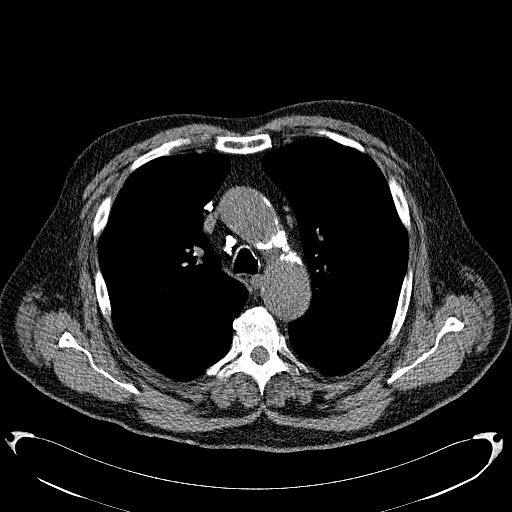
[im 39/57  lung]
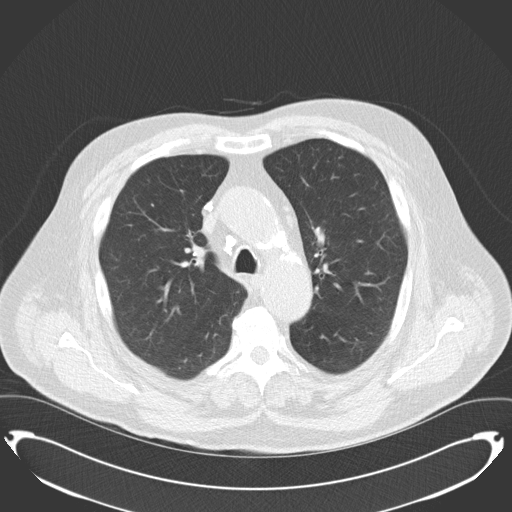
[im 44/57  lung]
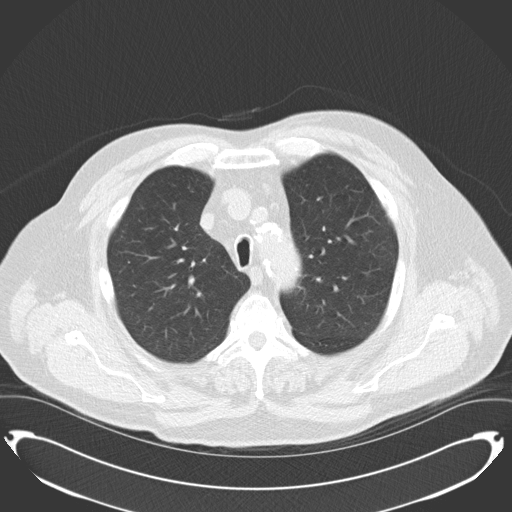
[im 49/57  lung]
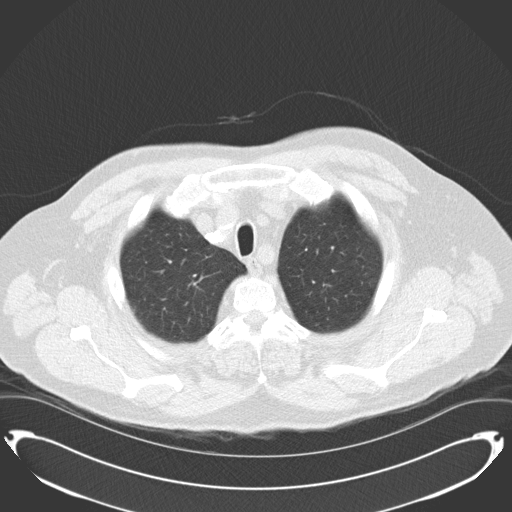
[im 54/57  lung]
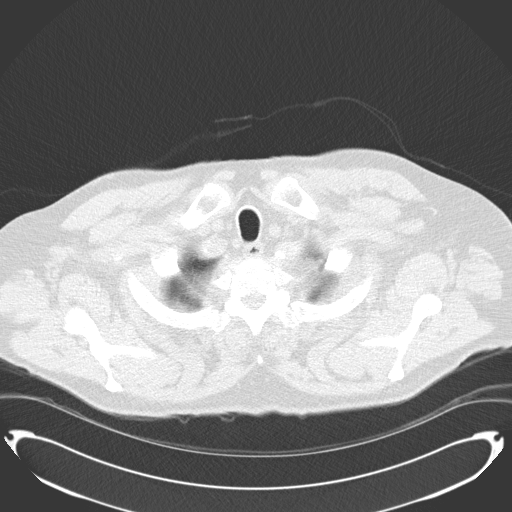

[Series 602: cor · coronal · 0.80mm/px · 3 of 131 slices shown]
[im 27/131  lung]
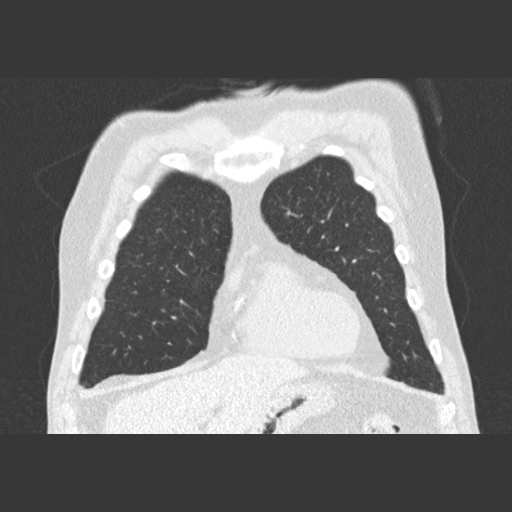
[im 53/131  lung]
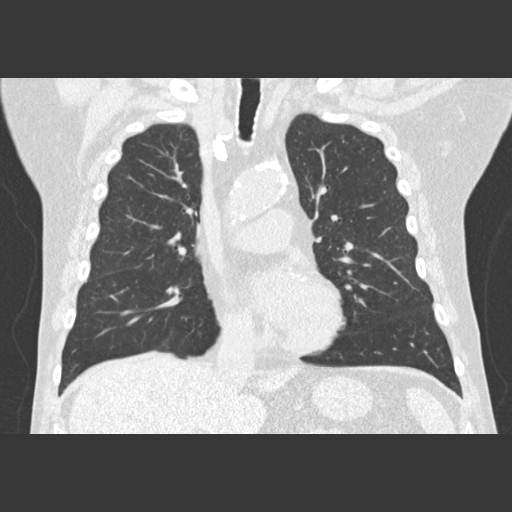
[im 79/131  lung]
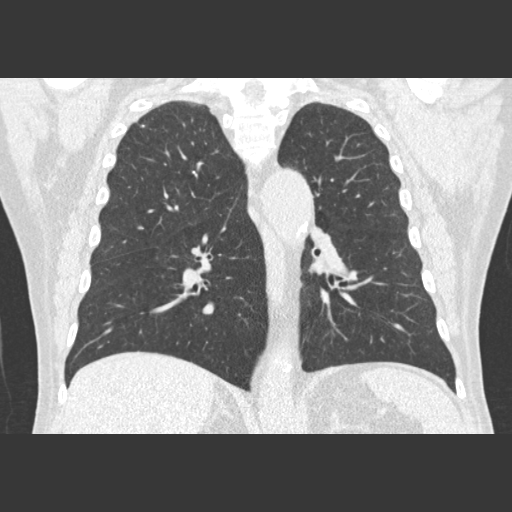

[15 of 40 positions shown; findings below may reference images not displayed]

FINDINGS: Mediastinum/Nodes: Heart is normal in size. No pericardial effusion.

Coronary atherosclerosis.

Atherosclerotic calcifications of the aortic arch.

Calcified mediastinal lymph nodes. 7 mm short axis right
paratracheal node (Series 2/image 3), within normal limits.

Visualized thyroid is unremarkable.

Lungs/Pleura: Calcified granulomata in the right upper lobe,
measuring up to 6.3 mm (series 5/ image 38).

2.5 mm (volumetric mean) noncalcified subpleural nodule in the
lateral right upper lobe (series 5/ image 39).

Mild emphysematous changes.  No focal consolidation.

No pleural effusion or pneumothorax.

Upper abdomen: Visualized upper abdomen is notable for vascular
calcifications.

Musculoskeletal: Degenerative changes of the visualized
thoracolumbar spine.
IMPRESSION: 2.5 mm noncalcified subpleural nodule in the right upper lobe.
Lung-RADS Category 2, benign appearance or behavior. Continue annual
screening with low-dose chest CT without contrast in 12 months.

## 2016-03-22 ENCOUNTER — Encounter: Payer: Self-pay | Admitting: Family Medicine

## 2016-03-22 ENCOUNTER — Ambulatory Visit (INDEPENDENT_AMBULATORY_CARE_PROVIDER_SITE_OTHER): Payer: Medicare Other | Admitting: Family Medicine

## 2016-03-22 VITALS — BP 110/70 | HR 71 | Temp 98.6°F | Ht 71.0 in | Wt 194.5 lb

## 2016-03-22 DIAGNOSIS — N289 Disorder of kidney and ureter, unspecified: Secondary | ICD-10-CM

## 2016-03-22 DIAGNOSIS — I10 Essential (primary) hypertension: Secondary | ICD-10-CM

## 2016-03-22 LAB — BASIC METABOLIC PANEL
BUN: 19 mg/dL (ref 6–23)
CALCIUM: 9.7 mg/dL (ref 8.4–10.5)
CO2: 30 meq/L (ref 19–32)
CREATININE: 1.13 mg/dL (ref 0.40–1.50)
Chloride: 103 mEq/L (ref 96–112)
GFR: 68.14 mL/min (ref >60.00)
Glucose, Bld: 95 mg/dL (ref 70–99)
Potassium: 4.1 mEq/L (ref 3.5–5.1)
SODIUM: 137 meq/L (ref 135–145)

## 2016-03-22 NOTE — Patient Instructions (Signed)
Stay off the Lisinopril for now Stay well hydrated Follow up if BP consistently > 140/90

## 2016-03-22 NOTE — Progress Notes (Signed)
Subjective:     Patient ID: John Graham, male   DOB: 22-Jun-1946, 70 y.o.   MRN: [id number]  HPI Patient seen for follow-up regarding recent visit Screening lab work revealed acute kidney injury with creatinine 1.5 (up from his usual baseline around 0.9). We discontinued his lisinopril HCTZ. Home blood pressures been very stable since then consistently around 120/70. He had been working outdoors a lot lately and drinking lots of caffeinated beverages.   Since last week he has transitioned over to water has been more focused on adequate fluid intake. No dizziness. No peripheral edema.  Past Medical History:  Diagnosis Date  . Anxiety   . Hypercholesteremia   . Hyperlipidemia   . Hypertension    Past Surgical History:  Procedure Laterality Date  . COLONOSCOPY    . DENTAL SURGERY     8 dental implants  . POLYPECTOMY      reports that he has been smoking Cigarettes.  He has a 53.00 pack-year smoking history. He has never used smokeless tobacco. He reports that he drinks about 3.0 oz of alcohol per week . He reports that he does not use drugs. family history includes Cancer in his father; Pancreatic cancer in his mother. No Known Allergies   Review of Systems  Constitutional: Negative for fatigue.  Eyes: Negative for visual disturbance.  Respiratory: Negative for cough, chest tightness and shortness of breath.   Cardiovascular: Negative for chest pain, palpitations and leg swelling.  Neurological: Negative for dizziness, syncope, weakness, light-headedness and headaches.       Objective:   Physical Exam  Constitutional: He appears well-developed and well-nourished.  Neck: Neck supple. No JVD present.  Cardiovascular: Normal rate and regular rhythm.   Pulmonary/Chest: Effort normal and breath sounds normal. No respiratory distress. He has no wheezes. He has no rales.  Musculoskeletal: He exhibits no edema.       Assessment:     #1 hypertension. Stable off lisinopril  HCTZ  #2 acute kidney injury probably related to dehydration    Plan:     -discontinue lisinopril HCTZ -Repeat basic metabolic panel -Follow-up for blood pressures consistently greater than 140/90  [name] MD [facility]

## 2016-03-27 ENCOUNTER — Other Ambulatory Visit: Payer: Self-pay | Admitting: Family Medicine

## 2016-05-01 ENCOUNTER — Other Ambulatory Visit: Payer: Self-pay | Admitting: Acute Care

## 2016-05-01 ENCOUNTER — Ambulatory Visit (INDEPENDENT_AMBULATORY_CARE_PROVIDER_SITE_OTHER)
Admission: RE | Admit: 2016-05-01 | Discharge: 2016-05-01 | Disposition: A | Discharge status: Home / Self Care | Payer: Medicare Other | Source: Ambulatory Visit | Attending: Acute Care | Admitting: Acute Care

## 2016-05-01 ENCOUNTER — Telehealth: Payer: Self-pay | Admitting: Acute Care

## 2016-05-01 DIAGNOSIS — F17210 Nicotine dependence, cigarettes, uncomplicated: Principal | ICD-10-CM

## 2016-05-01 DIAGNOSIS — Z87891 Personal history of nicotine dependence: Secondary | ICD-10-CM | POA: Diagnosis not present

## 2016-05-01 NOTE — Telephone Encounter (Signed)
I have called the results of the low-dose CT screening to John Graham. I explained that his scan was read as a Lung RADS 4 A : suspicious findings, either short term follow up in 3 months or alternatively  PET Scan evaluation may be considered when there is a solid component of  8 mm or larger. I explained that there was an enlarging of a nodule in his right lower lobe in the interval between this scan in his previous scan. The patient verbalized understanding, and understands that we will be rescanning him in 3 months, December 2017 per radiology recommendations. I told him that I would forward the results of the scan to his primary care physician [name]. The patient had no further questions at completion of the call, and has my contact information in the event he has future questions. The 3 month scan has been ordered.

## 2016-05-02 ENCOUNTER — Telehealth: Payer: Self-pay | Admitting: Family Medicine

## 2016-05-02 NOTE — Telephone Encounter (Signed)
Yes.  We ordered low dose CT lung cancer screen.  I will try to call him back to discuss.

## 2016-05-02 NOTE — Telephone Encounter (Signed)
Did you order this for patient? If so can you review results. Thanks.

## 2016-05-02 NOTE — Telephone Encounter (Signed)
° °  Pt said he received a call late yesterday afternoon and was returning the call . He said he had a CT SCAN

## 2016-05-04 NOTE — Telephone Encounter (Signed)
I can give him the results if you need me too if you have not already. Thanks.

## 2016-05-05 NOTE — Telephone Encounter (Signed)
We were trying to contact him to discuss that he had 3 vessel coronary atherosclerosis by low dose CT lung scan.  I would recommend office follow up to discuss.  Would like to discuss next steps.

## 2016-05-05 NOTE — Telephone Encounter (Signed)
Pt has been scheduled for Monday at 1:30 pm

## 2016-05-05 NOTE — Telephone Encounter (Signed)
Left message for pt to call back  °

## 2016-05-05 NOTE — Telephone Encounter (Signed)
See annotations below. Can you set up an appt for patient. Thank you.

## 2016-05-08 ENCOUNTER — Ambulatory Visit (INDEPENDENT_AMBULATORY_CARE_PROVIDER_SITE_OTHER): Payer: Medicare Other | Admitting: Family Medicine

## 2016-05-08 ENCOUNTER — Encounter: Payer: Self-pay | Admitting: Family Medicine

## 2016-05-08 VITALS — BP 100/70 | HR 73 | Temp 98.4°F | Ht 71.0 in | Wt 191.0 lb

## 2016-05-08 DIAGNOSIS — I10 Essential (primary) hypertension: Secondary | ICD-10-CM | POA: Diagnosis not present

## 2016-05-08 DIAGNOSIS — I2510 Atherosclerotic heart disease of native coronary artery without angina pectoris: Secondary | ICD-10-CM | POA: Diagnosis not present

## 2016-05-08 DIAGNOSIS — E785 Hyperlipidemia, unspecified: Secondary | ICD-10-CM

## 2016-05-08 NOTE — Progress Notes (Signed)
Subjective:     Patient ID: John Graham, male   DOB: 04/14/1946, 70 y.o.   MRN: [id number]  HPI Patient seen to discuss results of recent low-dose CT lung cancer screening. This showed right lower lobe pulmonary nodule 6 mm and follow-up for that has been scheduled. Additionally, stable 4.0 cm ascending thoracic aortic aneurysm along with comment of left main and three-vessel coronary atherosclerosis. Patient has a long-standing history of smoking. No history of known CAD. Denies any recent exertional chest pain. He does have some dyspnea with exertion which may be related to chronic lung issues He's not had any prior stress testing.  Risk factors for CAD include age, smoking history, hypertension, dyslipidemia Generally fairly sedentary  Past Medical History:  Diagnosis Date  . Anxiety   . Hypercholesteremia   . Hyperlipidemia   . Hypertension    Past Surgical History:  Procedure Laterality Date  . COLONOSCOPY    . DENTAL SURGERY     8 dental implants  . POLYPECTOMY      reports that he has been smoking Cigarettes.  He has a 53.00 pack-year smoking history. He has never used smokeless tobacco. He reports that he drinks about 3.0 oz of alcohol per week . He reports that he does not use drugs. family history includes Cancer in his father; Pancreatic cancer in his mother. No Known Allergies    Review of Systems  Constitutional: Negative for chills and fever.  Respiratory: Positive for shortness of breath. Negative for cough and chest tightness.   Cardiovascular: Negative for chest pain, palpitations and leg swelling.  Gastrointestinal: Negative for abdominal pain.  Genitourinary: Negative for dysuria.  Neurological: Negative for dizziness and syncope.       Objective:   Physical Exam  Constitutional: He appears well-developed and well-nourished.  Neck: Neck supple. No thyromegaly present.  Cardiovascular: Normal rate and regular rhythm.  Exam reveals no gallop.    Pulmonary/Chest: Effort normal and breath sounds normal. No respiratory distress. He has no wheezes. He has no rales.  Musculoskeletal: He exhibits no edema.  Lymphadenopathy:    He has no cervical adenopathy.       Assessment:     #1 Abnormal low-dose CT lung cancer screening with mention of three-vessel and left main coronary atherosclerosis. Patient has fairly high risk overall for CAD.  No recent angina symptoms.  #2 history of dyslipidemia on simvastatin  #3 hypertension    Plan:     -Strongly encouraged to stop smoking altogether -Set up nuclear stress test to further risk stratify -Flu vaccine given -suggested starting 81 mg aspirin daily.     [name] MD [facility]

## 2016-05-08 NOTE — Patient Instructions (Signed)
Start baby aspirin 81 mg once daily We will set up nuclear stress test.

## 2016-06-28 ENCOUNTER — Other Ambulatory Visit: Payer: Self-pay | Admitting: Family Medicine

## 2016-07-17 ENCOUNTER — Other Ambulatory Visit: Payer: Self-pay | Admitting: Family Medicine

## 2016-07-19 ENCOUNTER — Telehealth: Payer: Self-pay | Admitting: Family Medicine

## 2016-07-19 MED ORDER — IBUPROFEN 800 MG PO TABS: 800.0000 mg | ORAL_TABLET | Freq: Three times a day (TID) | ORAL | 1 refills | Status: DC | PRN

## 2016-07-19 NOTE — Telephone Encounter (Signed)
Pt checking the status of the Rx and pt is aware it is at the pharmacy

## 2016-07-19 NOTE — Telephone Encounter (Signed)
Needs refill ibuprofen (ADVIL,MOTRIN) 800 MG tablet

## 2016-07-19 NOTE — Telephone Encounter (Signed)
Medication sent in for patient. 

## 2016-08-01 ENCOUNTER — Ambulatory Visit (INDEPENDENT_AMBULATORY_CARE_PROVIDER_SITE_OTHER)
Admission: RE | Admit: 2016-08-01 | Discharge: 2016-08-01 | Disposition: A | Discharge status: Home / Self Care | Payer: Medicare Other | Source: Ambulatory Visit | Attending: Acute Care | Admitting: Acute Care

## 2016-08-01 DIAGNOSIS — F17210 Nicotine dependence, cigarettes, uncomplicated: Principal | ICD-10-CM

## 2016-08-01 DIAGNOSIS — J439 Emphysema, unspecified: Secondary | ICD-10-CM | POA: Diagnosis not present

## 2016-08-01 DIAGNOSIS — R918 Other nonspecific abnormal finding of lung field: Secondary | ICD-10-CM

## 2016-08-04 ENCOUNTER — Telehealth: Payer: Self-pay | Admitting: Acute Care

## 2016-08-04 DIAGNOSIS — F17210 Nicotine dependence, cigarettes, uncomplicated: Secondary | ICD-10-CM

## 2016-08-04 NOTE — Telephone Encounter (Signed)
I have called Mr. John Graham a with the results of his low-dose screening CT. I explained his scan was read as a lung RADS 2 indicating nodules that are benign in appearance and behavior. We discussed that this three-month scan showed stability of the nodule in question. Based on that nodule stability the scan was downgraded to a lung RADS 2, with radiology recommendation for repeat low dose screening CT in December 2018. Additionally there was notation of an ascending thoracic aorta at 4.1 cm with recommendation for annual imaging follow-up., In addition to emphysema and coronary artery and thoracic aortic  Atherosclerosis. The patient indicated to me that he is currently taking simvastatin and has his cholesterol and triglycerides checked on a regular basis by his primary care physician. The patient verbalized understanding of the above and had no further questions at completion of the call. I explained that we will order and schedule his repeat scan in December 2018.

## 2016-09-12 ENCOUNTER — Telehealth (HOSPITAL_COMMUNITY): Payer: Self-pay | Admitting: *Deleted

## 2016-09-12 NOTE — Telephone Encounter (Signed)
Patient given detailed instructions per Myocardial Perfusion Study Information Sheet for the test on 09/13/16 at 7:30. Patient notified to arrive 15 minutes early and that it is imperative to arrive on time for appointment to keep from having the test rescheduled.  If you need to cancel or reschedule your appointment, please call the office within 24 hours of your appointment. Failure to do so may result in a cancellation of your appointment, and a $50 no show fee. Patient verbalized understanding.John Graham

## 2016-09-12 NOTE — Telephone Encounter (Signed)
Left message on voicemail in reference to upcoming appointment scheduled for 09/13/16. Phone number given for a call back so details instructions can be given. John Graham

## 2016-09-13 ENCOUNTER — Ambulatory Visit (HOSPITAL_COMMUNITY): Payer: Medicare Other | Attending: Cardiovascular Disease

## 2016-09-13 DIAGNOSIS — R0609 Other forms of dyspnea: Secondary | ICD-10-CM | POA: Diagnosis not present

## 2016-09-13 DIAGNOSIS — I2510 Atherosclerotic heart disease of native coronary artery without angina pectoris: Secondary | ICD-10-CM | POA: Diagnosis not present

## 2016-09-13 DIAGNOSIS — I10 Essential (primary) hypertension: Secondary | ICD-10-CM | POA: Insufficient documentation

## 2016-09-13 DIAGNOSIS — R079 Chest pain, unspecified: Secondary | ICD-10-CM | POA: Diagnosis not present

## 2016-09-13 LAB — MYOCARDIAL PERFUSION IMAGING
CHL CUP NUCLEAR SDS: 3
CHL CUP NUCLEAR SSS: 6
LHR: 0.34
LVDIAVOL: 113 mL (ref 62–150)
LVSYSVOL: 48 mL
NUC STRESS TID: 1
Peak HR: 81 {beats}/min
Rest HR: 60 {beats}/min
SRS: 3

## 2016-09-13 MED ORDER — TECHNETIUM TC 99M TETROFOSMIN IV KIT
10.0000 | PACK | Freq: Once | INTRAVENOUS | Status: AC | PRN
Start: 2016-09-13 — End: 2016-09-13
  Administered 2016-09-13: 10 via INTRAVENOUS
  Filled 2016-09-13: qty 10

## 2016-09-13 MED ORDER — TECHNETIUM TC 99M TETROFOSMIN IV KIT
32.7000 | PACK | Freq: Once | INTRAVENOUS | Status: AC | PRN
  Administered 2016-09-13: 32.7 via INTRAVENOUS
  Filled 2016-09-13: qty 33

## 2016-09-13 MED ORDER — REGADENOSON 0.4 MG/5ML IV SOLN
0.4000 mg | Freq: Once | INTRAVENOUS | Status: AC
  Administered 2016-09-13: 0.4 mg via INTRAVENOUS

## 2016-09-25 ENCOUNTER — Other Ambulatory Visit: Payer: Self-pay | Admitting: Family Medicine

## 2016-10-21 ENCOUNTER — Other Ambulatory Visit: Payer: Self-pay | Admitting: Family Medicine

## 2016-10-25 ENCOUNTER — Encounter: Payer: Self-pay | Admitting: Family Medicine

## 2016-10-26 ENCOUNTER — Other Ambulatory Visit: Payer: Self-pay | Admitting: *Deleted

## 2016-10-26 MED ORDER — BETAMETHASONE DIPROPIONATE 0.05 % EX CREA: TOPICAL_CREAM | Freq: Two times a day (BID) | CUTANEOUS | 2 refills | Status: DC

## 2016-12-15 ENCOUNTER — Telehealth: Payer: Self-pay | Admitting: *Deleted

## 2016-12-15 MED ORDER — TRIAMCINOLONE ACETONIDE 0.1 % EX CREA: 1.0000 "application " | TOPICAL_CREAM | Freq: Two times a day (BID) | CUTANEOUS | 0 refills | Status: DC

## 2016-12-15 NOTE — Telephone Encounter (Signed)
Refill OK

## 2016-12-15 NOTE — Telephone Encounter (Signed)
[facility], [place] Refill request Triamcinolone 0.1 cream Not seen on med list Okay to refill?

## 2016-12-25 ENCOUNTER — Other Ambulatory Visit: Payer: Self-pay | Admitting: Family Medicine

## 2017-01-17 ENCOUNTER — Encounter: Payer: Self-pay | Admitting: Internal Medicine

## 2017-01-24 ENCOUNTER — Encounter: Payer: Self-pay | Admitting: Family Medicine

## 2017-01-24 ENCOUNTER — Ambulatory Visit (INDEPENDENT_AMBULATORY_CARE_PROVIDER_SITE_OTHER): Payer: Medicare Other | Admitting: Family Medicine

## 2017-01-24 VITALS — BP 102/68 | HR 67 | Temp 98.6°F | Wt 202.3 lb

## 2017-01-24 DIAGNOSIS — L819 Disorder of pigmentation, unspecified: Secondary | ICD-10-CM | POA: Diagnosis not present

## 2017-01-24 DIAGNOSIS — M1812 Unilateral primary osteoarthritis of first carpometacarpal joint, left hand: Secondary | ICD-10-CM | POA: Diagnosis not present

## 2017-01-24 DIAGNOSIS — M170 Bilateral primary osteoarthritis of knee: Secondary | ICD-10-CM | POA: Diagnosis not present

## 2017-01-24 DIAGNOSIS — H6121 Impacted cerumen, right ear: Secondary | ICD-10-CM | POA: Diagnosis not present

## 2017-01-24 NOTE — Progress Notes (Signed)
Subjective:     Patient ID: John Graham, male   DOB: 07-11-1946, 71 y.o.   MRN: [id number]  HPI Here with multiple issues  Right ear fullness and right ear pain for the past several days. No drainage. No dizziness  Bilateral knee pain left greater than right. He has known degenerative arthritis. Saw orthopedist about 3 years ago and had cortisone injection then which lasted for over couple years. Had some progressive pain and stiffness. No recent injury. No effusion.  He has some hyperpigmented discoloration medial leg right and left side. No injury. No itching.  Left thumb pain mostly at the [place] joint. Occasionally has sharp pain with movement. No specific injury.  Past Medical History:  Diagnosis Date  . Anxiety   . Hypercholesteremia   . Hyperlipidemia   . Hypertension    Past Surgical History:  Procedure Laterality Date  . COLONOSCOPY    . DENTAL SURGERY     8 dental implants  . POLYPECTOMY      reports that he has been smoking Cigarettes.  He has a 53.00 pack-year smoking history. He has never used smokeless tobacco. He reports that he drinks about 3.0 oz of alcohol per week . He reports that he does not use drugs. family history includes Cancer in his father; Pancreatic cancer in his mother. No Known Allergies     Review of Systems  Constitutional: Negative for fatigue.  HENT: Positive for ear pain. Negative for ear discharge.   Eyes: Negative for visual disturbance.  Respiratory: Negative for cough, chest tightness and shortness of breath.   Cardiovascular: Negative for chest pain, palpitations and leg swelling.  Musculoskeletal: Positive for arthralgias.  Skin: Positive for rash.  Neurological: Negative for dizziness, syncope, weakness, light-headedness and headaches.       Objective:   Physical Exam  Constitutional: He appears well-developed and well-nourished.  HENT:  Cerumen impaction right canal  Cardiovascular: Normal rate and regular rhythm.    Pulmonary/Chest: Effort normal and breath sounds normal. No respiratory distress. He has no wheezes.  Musculoskeletal: He exhibits no edema.  He has some tenderness over the left CMC joint. No erythema. No warmth. No ecchymosis.  Left knee medial joint line pain and tenderness to palpation. No effusion. No warmth. No ecchymosis. Good range of motion.  Skin:  Patient's has some hyperpigmentation medial leg left greater than right. This is a small patch like distribution       Assessment:     #1 cerumen impaction right canal  #2 probable degenerative arthritis left CMC joint  #3 bilateral knee pain left greater than right secondary to degenerative arthritis  #4 hyperpigmentation of skin lower legs    Plan:     -Irrigation of right ear canal per nurse with full removal of cerumen and TM appears normal. -Patient will schedule for consideration of corticosteroid injection left knee which has benefited him in the past -try Tylenol for pain.  Avoid regular use of NSAIDS  John Post MD [facility] at [facility]

## 2017-01-25 DIAGNOSIS — M1812 Unilateral primary osteoarthritis of first carpometacarpal joint, left hand: Secondary | ICD-10-CM | POA: Insufficient documentation

## 2017-01-30 DIAGNOSIS — D2211 Melanocytic nevi of right eyelid, including canthus: Secondary | ICD-10-CM | POA: Diagnosis not present

## 2017-01-30 DIAGNOSIS — H02834 Dermatochalasis of left upper eyelid: Secondary | ICD-10-CM | POA: Diagnosis not present

## 2017-01-30 DIAGNOSIS — H2513 Age-related nuclear cataract, bilateral: Secondary | ICD-10-CM | POA: Diagnosis not present

## 2017-01-30 DIAGNOSIS — H02831 Dermatochalasis of right upper eyelid: Secondary | ICD-10-CM | POA: Diagnosis not present

## 2017-03-18 ENCOUNTER — Other Ambulatory Visit: Payer: Self-pay | Admitting: Family Medicine

## 2017-03-25 ENCOUNTER — Other Ambulatory Visit: Payer: Self-pay | Admitting: Family Medicine

## 2017-03-27 ENCOUNTER — Encounter: Payer: Self-pay | Admitting: Internal Medicine

## 2017-03-27 ENCOUNTER — Ambulatory Visit (INDEPENDENT_AMBULATORY_CARE_PROVIDER_SITE_OTHER): Payer: Medicare Other | Admitting: Internal Medicine

## 2017-03-27 VITALS — BP 144/74 | HR 61 | Temp 98.4°F | Ht 71.0 in | Wt 205.2 lb

## 2017-03-27 DIAGNOSIS — S81812A Laceration without foreign body, left lower leg, initial encounter: Secondary | ICD-10-CM

## 2017-03-27 DIAGNOSIS — I10 Essential (primary) hypertension: Secondary | ICD-10-CM

## 2017-03-27 NOTE — Progress Notes (Signed)
Subjective:    Patient ID: John Graham, male    DOB: 1946-06-12, 71 y.o.   MRN: [id number]  HPI  71 year old patient who sustained a laceration to the left lateral leg just proximal to the knee.  4 days ago.  He has been cleaning daily and dressing the wound.  No significant pain, drainage or redness in the patient denies any fever. He is seen today after being encouraged by his spouse and asking whether suturing would be appropriate  Past Medical History:  Diagnosis Date  . Anxiety   . Hypercholesteremia   . Hyperlipidemia   . Hypertension      Social History   Social History  . Marital status: Married    Spouse name: N/A  . Number of children: N/A  . Years of education: N/A   Occupational History  . Not on file.   Social History Main Topics  . Smoking status: Current Every Day Smoker    Packs/day: 1.00    Years: 53.00    Types: Cigarettes  . Smokeless tobacco: Never Used     Comment: using electoronic cigarette. Will call when ready to quit. NOt currently ready.  . Alcohol use 3.0 oz/week    5 Standard drinks or equivalent per week  . Drug use: No  . Sexual activity: Not on file   Other Topics Concern  . Not on file   Social History Narrative   ** Merged History Encounter **        Past Surgical History:  Procedure Laterality Date  . COLONOSCOPY    . DENTAL SURGERY     8 dental implants  . POLYPECTOMY      Family History  Problem Relation Age of Onset  . Pancreatic cancer Mother   . Cancer Father        Lymph Nodes  . Colon cancer Neg Hx     No Known Allergies  Current Outpatient Prescriptions on File Prior to Visit  Medication Sig Dispense Refill  . ibuprofen (ADVIL,MOTRIN) 800 MG tablet Take 1 tablet (800 mg total) by mouth every 8 (eight) hours as needed. 90 tablet 1  . metoprolol tartrate (LOPRESSOR) 25 MG tablet TAKE 1 TABLET TWICE DAILY 180 tablet 0  . Multiple Vitamins-Minerals (CENTRUM PO) Take 1 tablet by mouth every morning.    .  sertraline (ZOLOFT) 50 MG tablet TAKE 1 TABLET BY MOUTH EVERY DAY 30 tablet 0  . simvastatin (ZOCOR) 20 MG tablet TAKE 1 TABLET EVERY EVENING 90 tablet 0   No current facility-administered medications on file prior to visit.     BP (!) 144/74 (BP Location: Left Arm, Patient Position: Sitting, Cuff Size: Normal)   Pulse 61   Temp 98.4 F (36.9 C) (Oral)   Ht 5\' 11"  (1.803 m)   Wt 205 lb 3.2 oz (93.1 kg)   SpO2 97%   BMI 28.62 kg/m     Review of Systems  Constitutional: Negative.   Skin: Positive for wound.       Objective:   Physical Exam  Constitutional: He appears well-developed and well-nourished. No distress.  Skin:  A 5 cm clean appearing avulsion type laceration involving the lateral aspect of the distal thigh just proximal to the knee.  The laceration extends more superficially for a few more centimeters.  The wound is clean without erythema or exudate.  The skin tear is fairly deep, but well approximated.          Assessment & Plan:  Laceration left lateral thigh.  Local wound care discussed.  The wound was cleaned.  Antibiotic ointment applied and dressed.  He is asked to return next week for follow-up. He will report any clinical deterioration such as increasing pain, redness, swelling or drainage.  Nyoka Cowden

## 2017-03-27 NOTE — Patient Instructions (Addendum)
   Keep the wound clean and dry.  If you were given a bandage (dressing), you should change it at least one time per day or as told by your doctor. You should also change it if it gets wet or dirty.    Clean the wound one time each day or as told by your doctor: ? Wash the wound with soap and water. ? Rinse the wound with water until all of the soap comes off. ? Pat the wound dry with a clean towel. Do not rub the wound.  After you clean the wound, put a thin layer of antibiotic ointment on it as told by your doctor. This ointment: ? Helps to prevent infection. ? Keeps the bandage from sticking to the wound. ?  Get help right away if:  You have very bad swelling around the wound.  Your pain suddenly gets worse and is very bad.  You notice painful lumps near the wound or on skin that is anywhere on your body.  You have a red streak going away from your wound.

## 2017-04-04 ENCOUNTER — Ambulatory Visit (INDEPENDENT_AMBULATORY_CARE_PROVIDER_SITE_OTHER): Payer: Medicare Other | Admitting: Family Medicine

## 2017-04-04 ENCOUNTER — Encounter: Payer: Self-pay | Admitting: Family Medicine

## 2017-04-04 VITALS — BP 120/80 | HR 73 | Temp 98.4°F | Wt 206.0 lb

## 2017-04-04 DIAGNOSIS — S71102D Unspecified open wound, left thigh, subsequent encounter: Secondary | ICD-10-CM

## 2017-04-04 DIAGNOSIS — Z23 Encounter for immunization: Secondary | ICD-10-CM | POA: Diagnosis not present

## 2017-04-04 MED ORDER — DOXYCYCLINE HYCLATE 100 MG PO CAPS: 100.0000 mg | ORAL_CAPSULE | Freq: Two times a day (BID) | ORAL | 0 refills | Status: DC

## 2017-04-04 NOTE — Progress Notes (Signed)
Subjective:     Patient ID: John Graham, male   DOB: 1946/01/21, 71 y.o.   MRN: [id number]  HPI Patient seen for follow-up regarding left lower extremity laceration. This occurred about 12 days ago. He was on his mower and was leaning over and basically lost balance and raked his thigh against an arm on the mower. He tore back skin but did not seek care until 5 days later. He was seen here in follow-up and ,of course, at that point could not have any kind of suturing done.  He has been applying hydrogen peroxide daily. He noted yesterday couple of small superficial pustular type lesions along the posterior aspect of the wound. No erythema. No fevers or chills. No known history of MRSA.  Past Medical History:  Diagnosis Date  . Anxiety   . Hypercholesteremia   . Hyperlipidemia   . Hypertension    Past Surgical History:  Procedure Laterality Date  . COLONOSCOPY    . DENTAL SURGERY     8 dental implants  . POLYPECTOMY      reports that he has been smoking Cigarettes.  He has a 53.00 pack-year smoking history. He has never used smokeless tobacco. He reports that he drinks about 3.0 oz of alcohol per week . He reports that he does not use drugs. family history includes Cancer in his father; Pancreatic cancer in his mother. No Known Allergies   Review of Systems  Constitutional: Negative for chills and fever.       Objective:   Physical Exam  Constitutional: He appears well-developed and well-nourished.  Cardiovascular: Normal rate and regular rhythm.   Pulmonary/Chest: Effort normal.  Skin:  Patient has open wound over the left lateral distal thigh region.  Approximately 4 cm in length and one and 1/2 cm in width. Along the posterior margin he has a couple of very superficial pustules. No erythema. No warmth. Nontender.  No fluctuance.       Assessment:     Open wound left distal thigh. This was not sutured because of duration between injury and time of presentation for  initial care. He has couple small superficial pustules around the periphery of the wound    Plan:     -Leave off hydroperoxide which could slow healing -Clean daily with soap and water and keep covered after drying -Doxycycline 100 mg twice daily for 10 days -Reassess in 6 days and sooner for any surrounding erythema or other concerns  [name] MD [facility] at [facility]

## 2017-04-04 NOTE — Patient Instructions (Signed)
Keep clean with soap and water Leave off hydrogen peroxide. Follow up for any increased redness, swelling, or drainage.

## 2017-04-10 ENCOUNTER — Ambulatory Visit (INDEPENDENT_AMBULATORY_CARE_PROVIDER_SITE_OTHER): Payer: Medicare Other | Admitting: Family Medicine

## 2017-04-10 ENCOUNTER — Encounter: Payer: Self-pay | Admitting: Family Medicine

## 2017-04-10 VITALS — BP 100/60 | HR 62 | Temp 98.5°F | Ht 71.0 in | Wt 204.1 lb

## 2017-04-10 DIAGNOSIS — S71102D Unspecified open wound, left thigh, subsequent encounter: Secondary | ICD-10-CM | POA: Diagnosis not present

## 2017-04-10 NOTE — Patient Instructions (Signed)
Set up physical exam.

## 2017-04-10 NOTE — Progress Notes (Signed)
Subjective:     Patient ID: John Graham, male   DOB: 04-Nov-1945, 71 y.o.   MRN: [id number]  HPI Patient seen for follow-up regarding open wound left lateral knee region. He did not seek care initially. He had a fairly large gaping wound and was seen here last week had some pustules surrounding the wound. We started doxycycline and wound is doing much better this time. No drainage. No fever. No chills. Nonpainful. Cleaning with soap and water. Has been leaving off hydroperoxide, as instructed  Past Medical History:  Diagnosis Date  . Anxiety   . Hypercholesteremia   . Hyperlipidemia   . Hypertension    Past Surgical History:  Procedure Laterality Date  . COLONOSCOPY    . DENTAL SURGERY     8 dental implants  . POLYPECTOMY      reports that he has been smoking Cigarettes.  He has a 53.00 pack-year smoking history. He has never used smokeless tobacco. He reports that he drinks about 3.0 oz of alcohol per week . He reports that he does not use drugs. family history includes Cancer in his father; Pancreatic cancer in his mother. No Known Allergies   Review of Systems  Constitutional: Negative for chills and fever.       Objective:   Physical Exam  Constitutional: He appears well-developed and well-nourished.  Cardiovascular: Normal rate.   Pulmonary/Chest: Effort normal and breath sounds normal. No respiratory distress. He has no wheezes. He has no rales.  Skin:  0.5 x 3 cm wound left lateral knee which is improving. Much more shallow and is filling in very nicely with good granulation tissue. No surrounding cellulitis changes. No drainage.       Assessment:     Open wound left lateral knee from recent injury while mowing. Appears to be healing very well with no secondary infection this time    Plan:     -Finish out doxycycline -Continue to clean daily with soap and water -Keep covered until fully healed -Follow-up for any redness, drainage, or other concerns  [name] MD [facility] at [facility]

## 2017-04-17 ENCOUNTER — Other Ambulatory Visit: Payer: Self-pay | Admitting: Family Medicine

## 2017-04-23 ENCOUNTER — Other Ambulatory Visit: Payer: Self-pay | Admitting: Family Medicine

## 2017-04-24 NOTE — Telephone Encounter (Signed)
Refill once 

## 2017-05-03 ENCOUNTER — Encounter: Payer: Self-pay | Admitting: Family Medicine

## 2017-05-14 ENCOUNTER — Encounter: Payer: Self-pay | Admitting: Family Medicine

## 2017-05-14 ENCOUNTER — Ambulatory Visit (INDEPENDENT_AMBULATORY_CARE_PROVIDER_SITE_OTHER): Payer: Medicare Other | Admitting: Family Medicine

## 2017-05-14 VITALS — BP 120/80 | HR 63 | Temp 98.5°F | Ht 70.5 in | Wt 206.4 lb

## 2017-05-14 DIAGNOSIS — Z23 Encounter for immunization: Secondary | ICD-10-CM

## 2017-05-14 DIAGNOSIS — Z1331 Encounter for screening for depression: Secondary | ICD-10-CM

## 2017-05-14 DIAGNOSIS — Z0000 Encounter for general adult medical examination without abnormal findings: Secondary | ICD-10-CM

## 2017-05-14 LAB — BASIC METABOLIC PANEL
BUN: 17 mg/dL (ref 6–23)
CALCIUM: 9.8 mg/dL (ref 8.4–10.5)
CHLORIDE: 103 meq/L (ref 96–112)
CO2: 30 mEq/L (ref 19–32)
CREATININE: 0.99 mg/dL (ref 0.40–1.50)
GFR: 79.12 mL/min (ref >60.00)
Glucose, Bld: 103 mg/dL — ABNORMAL HIGH (ref 70–99)
Potassium: 4.3 mEq/L (ref 3.5–5.1)
Sodium: 141 mEq/L (ref 135–145)

## 2017-05-14 LAB — CBC WITH DIFFERENTIAL/PLATELET
BASOS ABS: 0.1 10*3/uL (ref 0.0–0.1)
BASOS PCT: 0.7 % (ref 0.0–3.0)
EOS ABS: 0.2 10*3/uL (ref 0.0–0.7)
Eosinophils Relative: 2.1 % (ref 0.0–5.0)
HEMATOCRIT: 43.8 % (ref 39.0–52.0)
HEMOGLOBIN: 14.9 g/dL (ref 13.0–17.0)
LYMPHS PCT: 20.4 % (ref 12.0–46.0)
Lymphs Abs: 1.5 10*3/uL (ref 0.7–4.0)
MCHC: 34 g/dL (ref 30.0–36.0)
MCV: 97.8 fl (ref 78.0–100.0)
MONOS PCT: 6.2 % (ref 3.0–12.0)
Monocytes Absolute: 0.5 10*3/uL (ref 0.1–1.0)
Neutro Abs: 5.2 10*3/uL (ref 1.4–7.7)
Neutrophils Relative %: 70.6 % (ref 43.0–77.0)
Platelets: 189 10*3/uL (ref 150.0–400.0)
RBC: 4.47 Mil/uL (ref 4.22–5.81)
RDW: 13 % (ref 11.5–15.5)
WBC: 7.4 10*3/uL (ref 4.0–10.5)

## 2017-05-14 LAB — LIPID PANEL
CHOLESTEROL: 165 mg/dL (ref 0–200)
HDL: 36.2 mg/dL — ABNORMAL LOW (ref >39.00)
LDL CALC: 107 mg/dL — AB (ref 0–99)
NonHDL: 128.82
Total CHOL/HDL Ratio: 5
Triglycerides: 107 mg/dL (ref 0.0–149.0)
VLDL: 21.4 mg/dL (ref 0.0–40.0)

## 2017-05-14 LAB — HEPATIC FUNCTION PANEL
ALT: 12 U/L (ref 0–53)
AST: 14 U/L (ref 0–37)
Albumin: 4.4 g/dL (ref 3.5–5.2)
Alkaline Phosphatase: 90 U/L (ref 39–117)
BILIRUBIN DIRECT: 0.1 mg/dL (ref 0.0–0.3)
BILIRUBIN TOTAL: 0.6 mg/dL (ref 0.2–1.2)
TOTAL PROTEIN: 7.1 g/dL (ref 6.0–8.3)

## 2017-05-14 LAB — PSA: PSA: 0.4 ng/mL (ref 0.10–4.00)

## 2017-05-14 LAB — TSH: TSH: 0.63 u[IU]/mL (ref 0.35–4.50)

## 2017-05-14 NOTE — Patient Instructions (Signed)
Set up repeat colonoscopy We will set up low dose CT lung cancer screen.

## 2017-05-14 NOTE — Progress Notes (Signed)
Subjective:     Patient ID: John Graham, male   DOB: 1946/06/25, 71 y.o.   MRN: [id number]  HPI Patient seen for physical exam. His chronic problems include history of hyperlipidemia, ongoing nicotine use, hypertension, history of colon polyps  He is due for repeat colonoscopy and in process of setting that up. No history of hepatitis C screening. Needs Pneumovax. Ongoing nicotine use- about 1 pack cigarettes per day and approximately 70 pack year history.  Past Medical History:  Diagnosis Date  . Anxiety   . Hypercholesteremia   . Hyperlipidemia   . Hypertension    Past Surgical History:  Procedure Laterality Date  . COLONOSCOPY    . DENTAL SURGERY     8 dental implants  . POLYPECTOMY      reports that he has been smoking Cigarettes.  He has a 53.00 pack-year smoking history. He has never used smokeless tobacco. He reports that he drinks about 3.0 oz of alcohol per week . He reports that he does not use drugs. family history includes Cancer in his father; Pancreatic cancer in his mother. No Known Allergies   Review of Systems  Constitutional: Negative for activity change, appetite change, fatigue and fever.  HENT: Negative for congestion, ear pain and trouble swallowing.   Eyes: Negative for pain and visual disturbance.  Respiratory: Negative for cough, shortness of breath and wheezing.   Cardiovascular: Negative for chest pain and palpitations.  Gastrointestinal: Negative for abdominal distention, abdominal pain, blood in stool, constipation, diarrhea, nausea, rectal pain and vomiting.  Genitourinary: Negative for dysuria, hematuria and testicular pain.  Musculoskeletal: Positive for arthralgias. Negative for joint swelling.  Skin: Negative for rash.  Neurological: Negative for dizziness, syncope and headaches.  Hematological: Negative for adenopathy.  Psychiatric/Behavioral: Negative for confusion and dysphoric mood.       Objective:   Physical Exam  Constitutional:  He is oriented to person, place, and time. He appears well-developed and well-nourished. No distress.  HENT:  Head: Normocephalic and atraumatic.  Right Ear: External ear normal.  Left Ear: External ear normal.  Mouth/Throat: Oropharynx is clear and moist.  Eyes: Pupils are equal, round, and reactive to light. Conjunctivae and EOM are normal.  Neck: Normal range of motion. Neck supple. No thyromegaly present.  Cardiovascular: Normal rate, regular rhythm and normal heart sounds.   No murmur heard. Pulmonary/Chest: No respiratory distress. He has no wheezes. He has no rales.  Abdominal: Soft. Bowel sounds are normal. He exhibits no distension and no mass. There is no tenderness. There is no rebound and no guarding.  Musculoskeletal: He exhibits no edema.  Lymphadenopathy:    He has no cervical adenopathy.  Neurological: He is alert and oriented to person, place, and time. He displays normal reflexes. No cranial nerve deficit.  Skin: No rash noted.  Psychiatric: He has a normal mood and affect.       Assessment:     Physical exam. Addressed several issues as below    Plan:     -Needs follow-up colonoscopy and he is in process of setting that up -Obtain lab work including hepatitis C antibody -Pneumovax given. Flu vaccine already given -Discussed low-dose CT lung cancer screening and patient is interested in pursuing. He qualifies in terms of age and smoking history -Smoking cessation discussed. Low motivation.  [name] MD [facility] at [facility]

## 2017-05-14 NOTE — Addendum Note (Signed)
Addended by: Agnes Lawrence on: 05/14/2017 02:58 PM   Modules accepted: Orders

## 2017-05-15 ENCOUNTER — Encounter: Payer: Self-pay | Admitting: Family Medicine

## 2017-05-15 ENCOUNTER — Other Ambulatory Visit: Payer: Self-pay | Admitting: Family Medicine

## 2017-05-15 LAB — HEPATITIS C ANTIBODY
Hepatitis C Ab: NONREACTIVE
SIGNAL TO CUT-OFF: 0.1 (ref <1.00)

## 2017-06-22 ENCOUNTER — Other Ambulatory Visit: Payer: Self-pay | Admitting: Family Medicine

## 2017-07-09 ENCOUNTER — Encounter: Payer: Self-pay | Admitting: Internal Medicine

## 2017-08-02 ENCOUNTER — Ambulatory Visit (INDEPENDENT_AMBULATORY_CARE_PROVIDER_SITE_OTHER)
Admission: RE | Admit: 2017-08-02 | Discharge: 2017-08-02 | Disposition: A | Discharge status: Home / Self Care | Payer: Medicare Other | Source: Ambulatory Visit | Attending: Acute Care | Admitting: Acute Care

## 2017-08-02 DIAGNOSIS — F17210 Nicotine dependence, cigarettes, uncomplicated: Secondary | ICD-10-CM

## 2017-08-02 DIAGNOSIS — Z87891 Personal history of nicotine dependence: Secondary | ICD-10-CM | POA: Diagnosis not present

## 2017-08-16 ENCOUNTER — Telehealth: Payer: Self-pay | Admitting: Acute Care

## 2017-08-16 DIAGNOSIS — Z122 Encounter for screening for malignant neoplasm of respiratory organs: Secondary | ICD-10-CM

## 2017-08-16 DIAGNOSIS — F17210 Nicotine dependence, cigarettes, uncomplicated: Principal | ICD-10-CM

## 2017-08-16 NOTE — Telephone Encounter (Signed)
Patient returned call, CB is [phone] (wife cell).

## 2017-08-16 NOTE — Telephone Encounter (Signed)
LMTC x 1 to discuss CT results

## 2017-08-21 NOTE — Telephone Encounter (Signed)
Spoke with pt's wife and she asked that I call pt back later today at [phone].  Will call back.

## 2017-08-21 NOTE — Telephone Encounter (Signed)
LMTC x 1  

## 2017-08-23 NOTE — Telephone Encounter (Signed)
LMTC x 1  

## 2017-08-26 ENCOUNTER — Other Ambulatory Visit: Payer: Self-pay | Admitting: Family Medicine

## 2017-08-28 NOTE — Telephone Encounter (Signed)
Copy sent to PCP.  Order placed for 1 yr f/u ct.

## 2017-08-28 NOTE — Telephone Encounter (Signed)
LMTC  X 1

## 2017-08-28 NOTE — Telephone Encounter (Signed)
Patient returning call, states saw results in [place] and does not require a call back unless there is anything that needs to be discussed that is not in [place].  May call wife at [phone] if needed.

## 2017-08-30 ENCOUNTER — Ambulatory Visit (AMBULATORY_SURGERY_CENTER): Payer: Self-pay

## 2017-08-30 ENCOUNTER — Encounter: Payer: Self-pay | Admitting: Internal Medicine

## 2017-08-30 VITALS — Ht 71.0 in | Wt 209.4 lb

## 2017-08-30 DIAGNOSIS — Z86010 Personal history of colonic polyps: Secondary | ICD-10-CM

## 2017-08-30 MED ORDER — NA SULFATE-K SULFATE-MG SULF 17.5-3.13-1.6 GM/177ML PO SOLN: 1.0000 | Freq: Once | ORAL | 0 refills | Status: AC

## 2017-08-30 NOTE — Progress Notes (Signed)
Per pt, no allergies to soy or egg products.Pt not taking any weight loss meds or using  O2 at home.  Pt refused emmi video. 

## 2017-09-13 ENCOUNTER — Other Ambulatory Visit: Payer: Self-pay

## 2017-09-13 ENCOUNTER — Encounter: Payer: Self-pay | Admitting: Internal Medicine

## 2017-09-13 ENCOUNTER — Ambulatory Visit (AMBULATORY_SURGERY_CENTER): Payer: Medicare Other | Admitting: Internal Medicine

## 2017-09-13 VITALS — BP 109/68 | HR 63 | Temp 98.6°F | Resp 11 | Ht 71.0 in | Wt 209.0 lb

## 2017-09-13 DIAGNOSIS — D125 Benign neoplasm of sigmoid colon: Secondary | ICD-10-CM

## 2017-09-13 DIAGNOSIS — D124 Benign neoplasm of descending colon: Secondary | ICD-10-CM | POA: Diagnosis not present

## 2017-09-13 DIAGNOSIS — Z86010 Personal history of colonic polyps: Secondary | ICD-10-CM

## 2017-09-13 DIAGNOSIS — D122 Benign neoplasm of ascending colon: Secondary | ICD-10-CM | POA: Diagnosis not present

## 2017-09-13 DIAGNOSIS — Z1211 Encounter for screening for malignant neoplasm of colon: Secondary | ICD-10-CM | POA: Diagnosis not present

## 2017-09-13 DIAGNOSIS — D123 Benign neoplasm of transverse colon: Secondary | ICD-10-CM

## 2017-09-13 MED ORDER — SODIUM CHLORIDE 0.9 % IV SOLN
500.0000 mL | Freq: Once | INTRAVENOUS | Status: AC
Start: 2017-09-13 — End: ?

## 2017-09-13 NOTE — Progress Notes (Signed)
To PACU, VSS. Report to RN. Pt has a productive cough and reactive airway, likely due to smoking. Required suctioning to keep airway clear of mucus. Coughing has subsided at this point.tb

## 2017-09-13 NOTE — Patient Instructions (Signed)
YOU HAD AN ENDOSCOPIC PROCEDURE TODAY AT THE [facility]:   Refer to the procedure report that was given to you for any specific questions about what was found during the examination.  If the procedure report does not answer your questions, please call your gastroenterologist to clarify.  If you requested that your care partner not be given the details of your procedure findings, then the procedure report has been included in a sealed envelope for you to review at your convenience later.  YOU SHOULD EXPECT: Some feelings of bloating in the abdomen. Passage of more gas than usual.  Walking can help get rid of the air that was put into your GI tract during the procedure and reduce the bloating. If you had a lower endoscopy (such as a colonoscopy or flexible sigmoidoscopy) you may notice spotting of blood in your stool or on the toilet paper. If you underwent a bowel prep for your procedure, you may not have a normal bowel movement for a few days.  Please Note:  You might notice some irritation and congestion in your nose or some drainage.  This is from the oxygen used during your procedure.  There is no need for concern and it should clear up in a day or so.  SYMPTOMS TO REPORT IMMEDIATELY:   Following lower endoscopy (colonoscopy or flexible sigmoidoscopy):  Excessive amounts of blood in the stool  Significant tenderness or worsening of abdominal pains  Swelling of the abdomen that is new, acute  Fever of 100F or higher    For urgent or emergent issues, a gastroenterologist can be reached at any hour by calling [phone].   DIET:  We do recommend a small meal at first, but then you may proceed to your regular diet.  Drink plenty of fluids but you should avoid alcoholic beverages for 24 hours.  ACTIVITY:  You should plan to take it easy for the rest of today and you should NOT DRIVE or use heavy machinery until tomorrow (because of the sedation medicines used during the test).     FOLLOW UP: Our staff will call the number listed on your records the next business day following your procedure to check on you and address any questions or concerns that you may have regarding the information given to you following your procedure. If we do not reach you, we will leave a message.  However, if you are feeling well and you are not experiencing any problems, there is no need to return our call.  We will assume that you have returned to your regular daily activities without incident.  If any biopsies were taken you will be contacted by phone or by letter within the next 1-3 weeks.  Please call us at [phone] if you have not heard about the biopsies in 3 weeks.    SIGNATURES/CONFIDENTIALITY: You and/or your care partner have signed paperwork which will be entered into your electronic medical record.  These signatures attest to the fact that that the information above on your After Visit Summary has been reviewed and is understood.  Full responsibility of the confidentiality of this discharge information lies with you and/or your care-partner.   Resume medications. Information given on polyps and diverticulosis. 

## 2017-09-13 NOTE — Op Note (Signed)
[facility] Patient Name: John Graham Procedure Date: 09/13/2017 11:16 AM MRN: [id number] Endoscopist: Docia Chuck. Henrene Pastor , MD Age: 72 Referring MD:  Date of Birth: 04-19-1946 Gender: Male Account #: [id number] Procedure:                Colonoscopy, Cold snare polypectomy -5 Indications:              High risk colon cancer surveillance: Personal                            history of adenoma (10 mm or greater in size), High                            risk colon cancer surveillance: Personal history of                            multiple (3 or more) adenomas. Previous                            examinations 2007, 2012, 2015 Medicines:                Monitored Anesthesia Care Procedure:                Pre-Anesthesia Assessment:                           - Prior to the procedure, a History and Physical                            was performed, and patient medications and                            allergies were reviewed. The patient's tolerance of                            previous anesthesia was also reviewed. The risks                            and benefits of the procedure and the sedation                            options and risks were discussed with the patient.                            All questions were answered, and informed consent                            was obtained. Prior Anticoagulants: The patient has                            taken no previous anticoagulant or antiplatelet                            agents. ASA Grade Assessment: II - A patient with  mild systemic disease. After reviewing the risks                            and benefits, the patient was deemed in                            satisfactory condition to undergo the procedure.                           After obtaining informed consent, the colonoscope                            was passed under direct vision. Throughout the                            procedure, the  patient's blood pressure, pulse, and                            oxygen saturations were monitored continuously. The                            Model CF-HQ190L [id number]) scope was introduced                            through the anus and advanced to the the cecum,                            identified by appendiceal orifice and ileocecal                            valve. The ileocecal valve, appendiceal orifice,                            and rectum were photographed. The quality of the                            bowel preparation was good. The colonoscopy was                            performed without difficulty. The patient tolerated                            the procedure well. The bowel preparation used was                            SUPREP. Scope In: 11:26:04 AM Scope Out: 11:49:09 AM Scope Withdrawal Time: 0 hours 16 minutes 10 seconds  Total Procedure Duration: 0 hours 23 minutes 5 seconds  Findings:                 Five polyps were found in the sigmoid colon,                            descending colon, transverse colon and ascending  colon. The polyps were 3 to 6 mm in size. These                            polyps were removed with a cold snare. Resection                            and retrieval were complete.                           Multiple small and large-mouthed diverticula were                            found in the left colon.                           Internal hemorrhoids were found during retroflexion.                           The exam was otherwise without abnormality on                            direct and retroflexion views. Complications:            No immediate complications. Estimated blood loss:                            None. Estimated Blood Loss:     Estimated blood loss: none. Impression:               - Five 3 to 6 mm polyps in the sigmoid colon, in                            the descending colon, in the transverse colon  and                            in the ascending colon, removed with a cold snare.                            Resected and retrieved.                           - Diverticulosis in the left colon.                           - Internal hemorrhoids.                           - The examination was otherwise normal on direct                            and retroflexion views. Recommendation:           - Repeat colonoscopy in 3 years for surveillance.                           - Patient has a contact number available  for                            emergencies. The signs and symptoms of potential                            delayed complications were discussed with the                            patient. Return to normal activities tomorrow.                            Written discharge instructions were provided to the                            patient.                           - Resume previous diet.                           - Continue present medications.                           - Await pathology results.                           STOP SMOKING. STOP SMOKING. STOP SMOKING. Docia Chuck. Henrene Pastor, MD 09/13/2017 11:56:09 AM This report has been signed electronically.

## 2017-09-13 NOTE — Progress Notes (Signed)
Pt's states no medical or surgical changes since previsit or office visit. 

## 2017-09-13 NOTE — Progress Notes (Signed)
Called to room to assist during endoscopic procedure.  Patient ID and intended procedure confirmed with present staff. Received instructions for my participation in the procedure from the performing physician.  

## 2017-09-14 ENCOUNTER — Telehealth: Payer: Self-pay

## 2017-09-14 NOTE — Telephone Encounter (Signed)
Attempted to reach patient for post-procedure f/u call. No answer. Left message that we will attempt to reach him again later today and him to please not hesitate to call us if he has any questions/concerns regarding his care.

## 2017-09-14 NOTE — Telephone Encounter (Signed)
Attempted to reach patient for post-procedure f/u call. Lost connection as I began to speak with patient. Will make another attempt to reach him today.

## 2017-09-17 ENCOUNTER — Other Ambulatory Visit: Payer: Self-pay | Admitting: Family Medicine

## 2017-09-19 ENCOUNTER — Encounter: Payer: Self-pay | Admitting: Internal Medicine

## 2017-11-07 ENCOUNTER — Other Ambulatory Visit: Payer: Self-pay | Admitting: Family Medicine

## 2017-11-21 ENCOUNTER — Other Ambulatory Visit: Payer: Self-pay | Admitting: Family Medicine

## 2018-01-30 DIAGNOSIS — H2513 Age-related nuclear cataract, bilateral: Secondary | ICD-10-CM | POA: Diagnosis not present

## 2018-01-30 DIAGNOSIS — H02834 Dermatochalasis of left upper eyelid: Secondary | ICD-10-CM | POA: Diagnosis not present

## 2018-01-30 DIAGNOSIS — H25813 Combined forms of age-related cataract, bilateral: Secondary | ICD-10-CM | POA: Diagnosis not present

## 2018-01-30 DIAGNOSIS — H02831 Dermatochalasis of right upper eyelid: Secondary | ICD-10-CM | POA: Diagnosis not present

## 2018-03-10 ENCOUNTER — Other Ambulatory Visit: Payer: Self-pay | Admitting: Family Medicine

## 2018-04-22 ENCOUNTER — Other Ambulatory Visit: Payer: Self-pay | Admitting: Family Medicine

## 2018-05-02 ENCOUNTER — Other Ambulatory Visit: Payer: Self-pay | Admitting: Family Medicine

## 2018-05-03 NOTE — Telephone Encounter (Signed)
Refill once OK. 

## 2018-05-03 NOTE — Telephone Encounter (Signed)
Last refill 08/27/17 and last office visit 05/14/17  Okay to refill?

## 2018-05-16 ENCOUNTER — Other Ambulatory Visit: Payer: Self-pay | Admitting: Family Medicine

## 2018-05-23 ENCOUNTER — Other Ambulatory Visit: Payer: Self-pay | Admitting: Family Medicine

## 2018-05-26 ENCOUNTER — Other Ambulatory Visit: Payer: Self-pay | Admitting: Family Medicine

## 2018-06-03 ENCOUNTER — Other Ambulatory Visit: Payer: Self-pay | Admitting: Family Medicine

## 2018-06-04 ENCOUNTER — Ambulatory Visit (INDEPENDENT_AMBULATORY_CARE_PROVIDER_SITE_OTHER): Payer: Medicare Other | Admitting: Family Medicine

## 2018-06-04 ENCOUNTER — Other Ambulatory Visit: Payer: Self-pay

## 2018-06-04 ENCOUNTER — Encounter: Payer: Self-pay | Admitting: Family Medicine

## 2018-06-04 VITALS — BP 120/78 | HR 61 | Temp 98.2°F | Wt 208.8 lb

## 2018-06-04 DIAGNOSIS — I10 Essential (primary) hypertension: Secondary | ICD-10-CM

## 2018-06-04 DIAGNOSIS — M159 Polyosteoarthritis, unspecified: Secondary | ICD-10-CM

## 2018-06-04 DIAGNOSIS — E785 Hyperlipidemia, unspecified: Secondary | ICD-10-CM | POA: Diagnosis not present

## 2018-06-04 DIAGNOSIS — F411 Generalized anxiety disorder: Secondary | ICD-10-CM

## 2018-06-04 DIAGNOSIS — Z23 Encounter for immunization: Secondary | ICD-10-CM | POA: Diagnosis not present

## 2018-06-04 NOTE — Progress Notes (Signed)
  Subjective:     Patient ID: John Graham, male   DOB: Nov 05, 1945, 72 y.o.   MRN: [id number]  HPI Patient seen for medical follow-up.  His chronic problems include history of hypertension, hyperlipidemia, osteoarthritis involving multiple joints, and history of some chronic anxiety.  His medications include simvastatin 20 mg daily, sertraline 50 mg daily, metoprolol 25 mg twice daily.  He takes ibuprofen sporadically for severe arthritis pains but avoids regular use.  Denies any recent chest pains.  Blood pressures been fairly well controlled.  Has not had lipid panel in about a year now.  He states he is consistent with taking his medications.  Needs flu vaccine.  No recent falls.  Denies any depression issues.  Past Medical History:  Diagnosis Date  . Anxiety    PTSD  . Hypercholesteremia   . Hyperlipidemia   . Hypertension    Past Surgical History:  Procedure Laterality Date  . COLONOSCOPY    . DENTAL SURGERY     8 dental implants  . POLYPECTOMY      reports that he has been smoking cigarettes. He has a 53.00 pack-year smoking history. He has never used smokeless tobacco. He reports that he does not drink alcohol or use drugs. family history includes Cancer in his father; Pancreatic cancer in his mother. No Known Allergies   Review of Systems  Constitutional: Negative for fatigue and unexpected weight change.  Eyes: Negative for visual disturbance.  Respiratory: Negative for cough, chest tightness and shortness of breath.   Cardiovascular: Negative for chest pain, palpitations and leg swelling.  Gastrointestinal: Negative for abdominal pain.  Endocrine: Negative for polydipsia and polyuria.  Neurological: Negative for dizziness, syncope, weakness, light-headedness and headaches.  Psychiatric/Behavioral: Negative for dysphoric mood.       Objective:   Physical Exam  Constitutional: He is oriented to person, place, and time. He appears well-developed and well-nourished.   HENT:  Right Ear: External ear normal.  Left Ear: External ear normal.  Mouth/Throat: Oropharynx is clear and moist.  Eyes: Pupils are equal, round, and reactive to light.  Neck: Neck supple. No thyromegaly present.  Cardiovascular: Normal rate and regular rhythm.  Pulmonary/Chest: Effort normal and breath sounds normal. No respiratory distress. He has no wheezes. He has no rales.  Musculoskeletal: He exhibits no edema.  Neurological: He is alert and oriented to person, place, and time.       Assessment:     #1 hypertension stable and at goal  #2 dyslipidemia treated with simvastatin  #3.  History of osteoarthritis involving multiple joints  #4 history of chronic anxiety stable on sertraline    Plan:     -Flu vaccine given -Obtain labs with lipid panel, hepatic panel, basic metabolic panel -Cautioned against regular use of ibuprofen given his age -Recommend follow-up within 1 year and sooner as needed  [name] MD [facility] at [facility]

## 2018-06-05 ENCOUNTER — Encounter: Payer: Self-pay | Admitting: Family Medicine

## 2018-06-05 LAB — LIPID PANEL
CHOL/HDL RATIO: 4
Cholesterol: 147 mg/dL (ref 0–200)
HDL: 37.6 mg/dL — ABNORMAL LOW (ref >39.00)
LDL CALC: 88 mg/dL (ref 0–99)
NONHDL: 109.26
Triglycerides: 105 mg/dL (ref 0.0–149.0)
VLDL: 21 mg/dL (ref 0.0–40.0)

## 2018-06-05 LAB — HEPATIC FUNCTION PANEL
ALK PHOS: 80 U/L (ref 39–117)
ALT: 13 U/L (ref 0–53)
AST: 16 U/L (ref 0–37)
Albumin: 4.2 g/dL (ref 3.5–5.2)
BILIRUBIN DIRECT: 0.1 mg/dL (ref 0.0–0.3)
Total Bilirubin: 0.6 mg/dL (ref 0.2–1.2)
Total Protein: 7.4 g/dL (ref 6.0–8.3)

## 2018-06-05 LAB — BASIC METABOLIC PANEL
BUN: 18 mg/dL (ref 6–23)
CALCIUM: 9.5 mg/dL (ref 8.4–10.5)
CO2: 30 mEq/L (ref 19–32)
Chloride: 103 mEq/L (ref 96–112)
Creatinine, Ser: 1.19 mg/dL (ref 0.40–1.50)
GFR: 63.79 mL/min (ref >60.00)
Glucose, Bld: 94 mg/dL (ref 70–99)
POTASSIUM: 4.5 meq/L (ref 3.5–5.1)
SODIUM: 138 meq/L (ref 135–145)

## 2018-07-26 ENCOUNTER — Other Ambulatory Visit: Payer: Self-pay | Admitting: Family Medicine

## 2018-08-05 ENCOUNTER — Ambulatory Visit (INDEPENDENT_AMBULATORY_CARE_PROVIDER_SITE_OTHER)
Admission: RE | Admit: 2018-08-05 | Discharge: 2018-08-05 | Disposition: A | Discharge status: Home / Self Care | Payer: Medicare Other | Source: Ambulatory Visit | Attending: Acute Care | Admitting: Acute Care

## 2018-08-05 DIAGNOSIS — Z122 Encounter for screening for malignant neoplasm of respiratory organs: Secondary | ICD-10-CM

## 2018-08-05 DIAGNOSIS — F17210 Nicotine dependence, cigarettes, uncomplicated: Secondary | ICD-10-CM | POA: Diagnosis not present

## 2018-08-21 ENCOUNTER — Telehealth: Payer: Self-pay | Admitting: Acute Care

## 2018-08-21 DIAGNOSIS — Z87891 Personal history of nicotine dependence: Secondary | ICD-10-CM

## 2018-08-21 DIAGNOSIS — Z122 Encounter for screening for malignant neoplasm of respiratory organs: Secondary | ICD-10-CM

## 2018-08-21 DIAGNOSIS — F17210 Nicotine dependence, cigarettes, uncomplicated: Principal | ICD-10-CM

## 2018-08-21 NOTE — Telephone Encounter (Signed)
Called and spoke with patient, he stated that he looked on mychart for results and didn't see anything different but he wanted to make sure everything was correct. He would like for [facility] to give him a call back.

## 2018-08-23 NOTE — Telephone Encounter (Signed)
Pt informed of CT results per Sarah Groce, NP.  PT verbalized understanding.  Copy sent to PCP.  Order placed for 1 yr f/u CT.  

## 2018-08-23 NOTE — Telephone Encounter (Signed)
John Graham is at the office today doing results. Routing to her for her to follow up on.

## 2018-08-28 ENCOUNTER — Other Ambulatory Visit: Payer: Self-pay | Admitting: Family Medicine

## 2018-11-13 ENCOUNTER — Other Ambulatory Visit: Payer: Self-pay | Admitting: Family Medicine

## 2018-12-11 ENCOUNTER — Other Ambulatory Visit: Payer: Self-pay | Admitting: Family Medicine

## 2018-12-22 ENCOUNTER — Other Ambulatory Visit: Payer: Self-pay | Admitting: Family Medicine

## 2019-03-07 ENCOUNTER — Other Ambulatory Visit: Payer: Self-pay | Admitting: Family Medicine

## 2019-03-24 ENCOUNTER — Other Ambulatory Visit: Payer: Self-pay | Admitting: Family Medicine

## 2019-03-31 ENCOUNTER — Other Ambulatory Visit: Payer: Self-pay | Admitting: Family Medicine

## 2019-04-22 ENCOUNTER — Other Ambulatory Visit: Payer: Self-pay | Admitting: *Deleted

## 2019-04-22 DIAGNOSIS — Z20822 Contact with and (suspected) exposure to covid-19: Secondary | ICD-10-CM

## 2019-04-22 DIAGNOSIS — R6889 Other general symptoms and signs: Secondary | ICD-10-CM | POA: Diagnosis not present

## 2019-04-24 LAB — NOVEL CORONAVIRUS, NAA: SARS-CoV-2, NAA: NOT DETECTED

## 2019-05-10 ENCOUNTER — Other Ambulatory Visit: Payer: Self-pay | Admitting: Family Medicine

## 2019-05-15 NOTE — Telephone Encounter (Signed)
Routing to PCP for approval.

## 2019-05-16 NOTE — Telephone Encounter (Signed)
Refill for 1 year 

## 2019-05-19 NOTE — Telephone Encounter (Signed)
Patient called in to check on status of medication. Please advise.

## 2019-06-24 ENCOUNTER — Other Ambulatory Visit: Payer: Self-pay | Admitting: Family Medicine

## 2019-06-28 ENCOUNTER — Other Ambulatory Visit: Payer: Self-pay | Admitting: Family Medicine

## 2019-07-07 ENCOUNTER — Telehealth (INDEPENDENT_AMBULATORY_CARE_PROVIDER_SITE_OTHER): Payer: Medicare Other | Admitting: Family Medicine

## 2019-07-07 ENCOUNTER — Other Ambulatory Visit: Payer: Self-pay

## 2019-07-07 ENCOUNTER — Encounter: Payer: Self-pay | Admitting: Family Medicine

## 2019-07-07 DIAGNOSIS — E785 Hyperlipidemia, unspecified: Secondary | ICD-10-CM

## 2019-07-07 DIAGNOSIS — F419 Anxiety disorder, unspecified: Secondary | ICD-10-CM | POA: Diagnosis not present

## 2019-07-07 DIAGNOSIS — I10 Essential (primary) hypertension: Secondary | ICD-10-CM

## 2019-07-07 MED ORDER — SIMVASTATIN 20 MG PO TABS: ORAL_TABLET | ORAL | 3 refills | Status: DC

## 2019-07-07 MED ORDER — METOPROLOL TARTRATE 25 MG PO TABS: 25.0000 mg | ORAL_TABLET | Freq: Two times a day (BID) | ORAL | 3 refills | Status: DC

## 2019-07-07 NOTE — Progress Notes (Signed)
This visit type was conducted due to national recommendations for restrictions regarding the COVID-19 pandemic in an effort to limit this patient's exposure and mitigate transmission in our community.   Virtual Visit via Video Note  I connected with John Graham on 07/07/19 at  1:30 PM EST by a video enabled telemedicine application and verified that I am speaking with the correct person using two identifiers.  Location patient: home Location provider:work or home office Persons participating in the virtual visit: patient, provider  I discussed the limitations of evaluation and management by telemedicine and the availability of in person appointments. The patient expressed understanding and agreed to proceed.   HPI:  Patient needed follow-up regarding chronic anxiety symptoms, hyperlipidemia, and hypertension.  He takes metoprolol, simvastatin, and sertraline.  He has stayed extremely quarantine for the past several months and is very anxious about getting out and coming to the office even for lab work.  He is high risk because of his age and other comorbidities.  He last had labs about a year ago.  He states his blood pressures been stable around 1 79-8 34 systolic and 92J diastolic.  His current weight is 201 pounds.  Pulse has been 65-70 range.  No chest pain.  No recent falls.  Compliant with medications.  Denies medication side effects.  Mood is stable   ROS: See pertinent positives and negatives per HPI.  Past Medical History:  Diagnosis Date  . Anxiety    PTSD  . Hypercholesteremia   . Hyperlipidemia   . Hypertension     Past Surgical History:  Procedure Laterality Date  . COLONOSCOPY    . DENTAL SURGERY     8 dental implants  . POLYPECTOMY      Family History  Problem Relation Age of Onset  . Pancreatic cancer Mother   . Cancer Father        Lymph Nodes  . Colon cancer Neg Hx     SOCIAL HX: Long history of nicotine use   Current Outpatient Medications:  .   CALCIUM-MAGNESIUM-VITAMIN D PO, Take by mouth. With Zinc-Take one pill daily, Disp: , Rfl:  .  desoximetasone (TOPICORT) 0.25 % cream, APPLY TOPICALLY TO AFFECTED AREAS 2 TIMES DAILY, Disp: 60 g, Rfl: 1 .  ibuprofen (ADVIL) 800 MG tablet, TAKE 1 TABLET BY MOUTH EVERY 8 HOURS AS NEEDED, Disp: 90 tablet, Rfl: 0 .  metoprolol tartrate (LOPRESSOR) 25 MG tablet, Take 1 tablet (25 mg total) by mouth 2 (two) times daily., Disp: 180 tablet, Rfl: 3 .  Multiple Vitamins-Minerals (CENTRUM PO), Take 1 tablet by mouth every morning., Disp: , Rfl:  .  sertraline (ZOLOFT) 50 MG tablet, TAKE 1 TABLET BY MOUTH EVERY DAY, Disp: 90 tablet, Rfl: 3 .  simvastatin (ZOCOR) 20 MG tablet, TAKE 1 TABLET BY MOUTH EVERY DAY IN THE EVENING, Disp: 90 tablet, Rfl: 3 .  TURMERIC PO, Take by mouth daily., Disp: , Rfl:   Current Facility-Administered Medications:  .  0.9 %  sodium chloride infusion, 500 mL, Intravenous, Once, Irene Shipper, MD  EXAM:  VITALS per patient if applicable:  GENERAL: alert, oriented, appears well and in no acute distress  HEENT: atraumatic, conjunttiva clear, no obvious abnormalities on inspection of external nose and ears  NECK: normal movements of the head and neck  LUNGS: on inspection no signs of respiratory distress, breathing rate appears normal, no obvious gross SOB, gasping or wheezing  CV: no obvious cyanosis  MS: moves all visible extremities  without noticeable abnormality  PSYCH/NEURO: pleasant and cooperative, no obvious depression or anxiety, speech and thought processing grossly intact  ASSESSMENT AND PLAN:  Discussed the following assessment and plan:  #1 hypertension stable by home readings -Refill metoprolol for 1 year  #2 hyperlipidemia-is overdue for labs -Refilled simvastatin for 1 year.  We discussed lab work and he is very anxious to get out at this point.  We will reconsider in the spring  #3 chronic anxiety stable on sertraline     I discussed the  assessment and treatment plan with the patient. The patient was provided an opportunity to ask questions and all were answered. The patient agreed with the plan and demonstrated an understanding of the instructions.   The patient was advised to call back or seek an in-person evaluation if the symptoms worsen or if the condition fails to improve as anticipated.     Carolann Littler, MD

## 2019-11-15 ENCOUNTER — Other Ambulatory Visit: Payer: Self-pay | Admitting: Family Medicine

## 2020-01-15 ENCOUNTER — Other Ambulatory Visit: Payer: Self-pay | Admitting: Family Medicine

## 2020-05-04 ENCOUNTER — Other Ambulatory Visit: Payer: Self-pay | Admitting: Family Medicine

## 2020-06-21 ENCOUNTER — Other Ambulatory Visit: Payer: Self-pay | Admitting: Family Medicine

## 2020-06-22 NOTE — Telephone Encounter (Signed)
Please advise Last VV 07/07/19 Last fill 11/17/19 #90/0

## 2020-06-22 NOTE — Telephone Encounter (Signed)
Needs follow up.   I would like to discuss possible alternative medications.  High dose Ibuprofen is riskier at his age.

## 2020-06-25 NOTE — Telephone Encounter (Signed)
Left message on voicemail to call office.  

## 2020-06-28 ENCOUNTER — Other Ambulatory Visit: Payer: Self-pay

## 2020-06-28 ENCOUNTER — Encounter: Payer: Self-pay | Admitting: Family Medicine

## 2020-06-28 ENCOUNTER — Ambulatory Visit (INDEPENDENT_AMBULATORY_CARE_PROVIDER_SITE_OTHER): Payer: Medicare Other | Admitting: Family Medicine

## 2020-06-28 VITALS — BP 124/70 | HR 73 | Ht 71.0 in | Wt 222.0 lb

## 2020-06-28 DIAGNOSIS — I10 Essential (primary) hypertension: Secondary | ICD-10-CM | POA: Diagnosis not present

## 2020-06-28 DIAGNOSIS — Z23 Encounter for immunization: Secondary | ICD-10-CM

## 2020-06-28 DIAGNOSIS — M170 Bilateral primary osteoarthritis of knee: Secondary | ICD-10-CM

## 2020-06-28 DIAGNOSIS — E785 Hyperlipidemia, unspecified: Secondary | ICD-10-CM

## 2020-06-28 NOTE — Progress Notes (Signed)
Established Patient Office Visit  Subjective:  Patient ID: John Graham, male    DOB: [birth year]  Age: 74 y.o. MRN: [id number]  CC:  Chief Complaint  Patient presents with  . Knee Pain    bilateral    HPI John Graham presents for medical follow-up.  He had called earlier for refills of ibuprofen 800 mg.  We expressed our concerns about him being on this dosage of ibuprofen regularly given his age and other comorbidities.  He has not taken a Tylenol for his osteoarthritis which predominantly involves left knee.  He is fairly adamant that he would not look at any surgical options.  He has not tried any topicals such as diclofenac.  He has history of hypertension, psoriasis, hyperlipidemia.  He remains on simvastatin 20 mg daily, sertraline 50 mg daily, metoprolol 25 mg twice daily.  Still smokes regularly.  Low motivation to quit.  Has had Covid vaccines.  He stayed fairly isolated during the pandemic.  Denies any recent chest pains.  Denies any falls.  He states he has been compliant with his medications.  Overdue for labs.  Past Medical History:  Diagnosis Date  . Anxiety    PTSD  . Hypercholesteremia   . Hyperlipidemia   . Hypertension     Past Surgical History:  Procedure Laterality Date  . COLONOSCOPY    . DENTAL SURGERY     8 dental implants  . POLYPECTOMY      Family History  Problem Relation Age of Onset  . Pancreatic cancer Mother   . Cancer Father        Lymph Nodes  . Colon cancer Neg Hx     Social History   Socioeconomic History  . Marital status: Married    Spouse name: Not on file  . Number of children: Not on file  . Years of education: Not on file  . Highest education level: Not on file  Occupational History  . Not on file  Tobacco Use  . Smoking status: Current Every Day Smoker    Packs/day: 1.00    Years: 53.00    Pack years: 53.00    Types: Cigarettes  . Smokeless tobacco: Never Used  Vaping Use  . Vaping Use: Never used    Substance and Sexual Activity  . Alcohol use: No  . Drug use: No  . Sexual activity: Not on file  Other Topics Concern  . Not on file  Social History Narrative   ** Merged History Encounter **       Social Determinants of Health   Financial Resource Strain:   . Difficulty of Paying Living Expenses: Not on file  Food Insecurity:   . Worried About Charity fundraiser in the Last Year: Not on file  . Ran Out of Food in the Last Year: Not on file  Transportation Needs:   . Lack of Transportation (Medical): Not on file  . Lack of Transportation (Non-Medical): Not on file  Physical Activity:   . Days of Exercise per Week: Not on file  . Minutes of Exercise per Session: Not on file  Stress:   . Feeling of Stress : Not on file  Social Connections:   . Frequency of Communication with Friends and Family: Not on file  . Frequency of Social Gatherings with Friends and Family: Not on file  . Attends Religious Services: Not on file  . Active Member of Clubs or Organizations: Not on file  . Attends  Club or Organization Meetings: Not on file  . Marital Status: Not on file  Intimate Partner Violence:   . Fear of Current or Ex-Partner: Not on file  . Emotionally Abused: Not on file  . Physically Abused: Not on file  . Sexually Abused: Not on file    Outpatient Medications Prior to Visit  Medication Sig Dispense Refill  . CALCIUM-MAGNESIUM-VITAMIN D PO Take by mouth. With Zinc-Take one pill daily    . desoximetasone (TOPICORT) 0.25 % cream APPLY TOPICALLY TO AFFECTED AREAS 2 TIMES DAILY 60 g 1  . metoprolol tartrate (LOPRESSOR) 25 MG tablet Take 1 tablet (25 mg total) by mouth 2 (two) times daily. 180 tablet 3  . Multiple Vitamins-Minerals (CENTRUM PO) Take 1 tablet by mouth every morning.    . sertraline (ZOLOFT) 50 MG tablet TAKE 1 TABLET BY MOUTH EVERY DAY 90 tablet 3  . simvastatin (ZOCOR) 20 MG tablet TAKE 1 TABLET BY MOUTH EVERY DAY IN THE EVENING 90 tablet 3  . ibuprofen (ADVIL)  800 MG tablet TAKE 1 TABLET BY MOUTH EVERY 8 HOURS AS NEEDED 90 tablet 0  . TURMERIC PO Take by mouth daily.     Facility-Administered Medications Prior to Visit  Medication Dose Route Frequency Provider Last Rate Last Admin  . 0.9 %  sodium chloride infusion  500 mL Intravenous Once Irene Shipper, MD        No Known Allergies  ROS Review of Systems  Constitutional: Negative for appetite change.  Respiratory: Negative for cough.   Cardiovascular: Negative for chest pain, palpitations and leg swelling.  Gastrointestinal: Negative for abdominal pain.  Genitourinary: Negative for dysuria.  Musculoskeletal: Positive for arthralgias.      Objective:    Physical Exam Vitals reviewed.  Cardiovascular:     Rate and Rhythm: Normal rate and regular rhythm.  Pulmonary:     Effort: Pulmonary effort is normal.     Breath sounds: Normal breath sounds.  Musculoskeletal:     Right lower leg: No edema.     Left lower leg: No edema.     Comments: He has significant crepitus with flexion/ extension of the left knee  Neurological:     Mental Status: He is alert.     Pulse 73   Ht 5\' 11"  (1.803 m)   Wt 222 lb (100.7 kg)   SpO2 97%   BMI 30.96 kg/m  Wt Readings from Last 3 Encounters:  06/28/20 222 lb (100.7 kg)  06/04/18 208 lb 12.8 oz (94.7 kg)  09/13/17 209 lb (94.8 kg)     Health Maintenance Due  Topic Date Due  . COVID-19 Vaccine (1) Never done    There are no preventive care reminders to display for this patient.  Lab Results  Component Value Date   TSH 0.63 05/14/2017   Lab Results  Component Value Date   WBC 7.4 05/14/2017   HGB 14.9 05/14/2017   HCT 43.8 05/14/2017   MCV 97.8 05/14/2017   PLT 189.0 05/14/2017   Lab Results  Component Value Date   NA 138 06/04/2018   K 4.5 06/04/2018   CO2 30 06/04/2018   GLUCOSE 94 06/04/2018   BUN 18 06/04/2018   CREATININE 1.19 06/04/2018   BILITOT 0.6 06/04/2018   ALKPHOS 80 06/04/2018   AST 16 06/04/2018   ALT  13 06/04/2018   PROT 7.4 06/04/2018   ALBUMIN 4.2 06/04/2018   CALCIUM 9.5 06/04/2018   GFR 63.79 06/04/2018   Lab Results  Component Value Date   CHOL 147 06/04/2018   Lab Results  Component Value Date   HDL 37.60 (L) 06/04/2018   Lab Results  Component Value Date   LDLCALC 88 06/04/2018   Lab Results  Component Value Date   TRIG 105.0 06/04/2018   Lab Results  Component Value Date   CHOLHDL 4 06/04/2018   No results found for: HGBA1C    Assessment & Plan:   #1 osteoarthritis left knee greater than right. -Avoid regular use of nonsteroidals.  He has very high risk for complication given his age and other co-morbidities -We recommend try instead over-the-counter Tylenol and consider topical diclofenac -We discussed other options such as steroid injection but he declines at this time  #2 hyperlipidemia treated with simvastatin -Recheck lipid panel and hepatic panel  #3 hypertension-stable -Check basic metabolic panel  -Flu vaccine given  No orders of the defined types were placed in this encounter.   Follow-up: No follow-ups on file.    Carolann Littler, MD

## 2020-06-28 NOTE — Patient Instructions (Signed)

## 2020-06-29 LAB — HEPATIC FUNCTION PANEL
AG Ratio: 1.5 (calc) (ref 1.0–2.5)
ALT: 11 U/L (ref 9–46)
AST: 16 U/L (ref 10–35)
Albumin: 4.2 g/dL (ref 3.6–5.1)
Alkaline phosphatase (APISO): 87 U/L (ref 35–144)
Bilirubin, Direct: 0.2 mg/dL (ref 0.0–0.2)
Globulin: 2.8 g/dL (calc) (ref 1.9–3.7)
Indirect Bilirubin: 0.5 mg/dL (calc) (ref 0.2–1.2)
Total Bilirubin: 0.7 mg/dL (ref 0.2–1.2)
Total Protein: 7 g/dL (ref 6.1–8.1)

## 2020-06-29 LAB — LIPID PANEL
Cholesterol: 170 mg/dL (ref <200)
HDL: 39 mg/dL — ABNORMAL LOW (ref >=40)
LDL Cholesterol (Calc): 108 mg/dL (calc) — ABNORMAL HIGH
Non-HDL Cholesterol (Calc): 131 mg/dL (calc) — ABNORMAL HIGH (ref <130)
Total CHOL/HDL Ratio: 4.4 (calc) (ref <5.0)
Triglycerides: 120 mg/dL (ref <150)

## 2020-06-29 LAB — BASIC METABOLIC PANEL
BUN: 17 mg/dL (ref 7–25)
CO2: 28 mmol/L (ref 20–32)
Calcium: 9.2 mg/dL (ref 8.6–10.3)
Chloride: 104 mmol/L (ref 98–110)
Creat: 1.07 mg/dL (ref 0.70–1.18)
Glucose, Bld: 115 mg/dL — ABNORMAL HIGH (ref 65–99)
Potassium: 4.1 mmol/L (ref 3.5–5.3)
Sodium: 141 mmol/L (ref 135–146)

## 2020-06-30 NOTE — Progress Notes (Signed)
Liver panel normal.  LDL cholesterol is up slightly.  Increase simvastatin to 40 mg daily

## 2020-07-16 ENCOUNTER — Other Ambulatory Visit: Payer: Self-pay | Admitting: Family Medicine

## 2020-09-14 ENCOUNTER — Ambulatory Visit (INDEPENDENT_AMBULATORY_CARE_PROVIDER_SITE_OTHER): Payer: Medicare Other

## 2020-09-14 DIAGNOSIS — Z0000 Encounter for general adult medical examination without abnormal findings: Secondary | ICD-10-CM | POA: Diagnosis not present

## 2020-09-14 DIAGNOSIS — F17200 Nicotine dependence, unspecified, uncomplicated: Secondary | ICD-10-CM | POA: Diagnosis not present

## 2020-09-14 NOTE — Patient Instructions (Signed)
John Graham , Thank you for taking time to come for your Medicare Wellness Visit. I appreciate your ongoing commitment to your health goals. Please review the following plan we discussed and let me know if I can assist you in the future.   Screening recommendations/referrals: Colonoscopy: Currently due as of 09/13/2020. Please let us know when you are ready for [place] to get you set up. Recommended yearly ophthalmology/optometry visit for glaucoma screening and checkup Recommended yearly dental visit for hygiene and checkup  Vaccinations: Influenza vaccine: Up to date, next due fall 2022  Pneumococcal vaccine: Completed series Tdap vaccine: Up to date, next due 01/03/2022 Shingles vaccine: Currently due for Shingrix, if you wish to receive we recommend that you do so at your local pharmacy as it is less expensive.     Advanced directives: Please bring in copies of your Living Will and [place] so that we may scan it into your chart.   Conditions/risks identified: None   Next appointment: None   Preventive Care 65 Years and Older, Male Preventive care refers to lifestyle choices and visits with your health care provider that can promote health and wellness. What does preventive care include?  A yearly physical exam. This is also called an annual well check.  Dental exams once or twice a year.  Routine eye exams. Ask your health care provider how often you should have your eyes checked.  Personal lifestyle choices, including:  Daily care of your teeth and gums.  Regular physical activity.  Eating a healthy diet.  Avoiding tobacco and drug use.  Limiting alcohol use.  Practicing safe sex.  Taking low doses of aspirin every day.  Taking vitamin and mineral supplements as recommended by your health care provider. What happens during an annual well check? The services and screenings done by your health care provider during your annual well check will  depend on your age, overall health, lifestyle risk factors, and family history of disease. Counseling  Your health care provider may ask you questions about your:  Alcohol use.  Tobacco use.  Drug use.  Emotional well-being.  Home and relationship well-being.  Sexual activity.  Eating habits.  History of falls.  Memory and ability to understand (cognition).  Work and work Statistician. Screening  You may have the following tests or measurements:  Height, weight, and BMI.  Blood pressure.  Lipid and cholesterol levels. These may be checked every 5 years, or more frequently if you are over 1 years old.  Skin check.  Lung cancer screening. You may have this screening every year starting at age 4 if you have a 30-pack-year history of smoking and currently smoke or have quit within the past 15 years.  Fecal occult blood test (FOBT) of the stool. You may have this test every year starting at age 35.  Flexible sigmoidoscopy or colonoscopy. You may have a sigmoidoscopy every 5 years or a colonoscopy every 10 years starting at age 79.  Prostate cancer screening. Recommendations will vary depending on your family history and other risks.  Hepatitis C blood test.  Hepatitis B blood test.  Sexually transmitted disease (STD) testing.  Diabetes screening. This is done by checking your blood sugar (glucose) after you have not eaten for a while (fasting). You may have this done every 1-3 years.  Abdominal aortic aneurysm (AAA) screening. You may need this if you are a current or former smoker.  Osteoporosis. You may be screened starting at age 16  if you are at high risk. Talk with your health care provider about your test results, treatment options, and if necessary, the need for more tests. Vaccines  Your health care provider may recommend certain vaccines, such as:  Influenza vaccine. This is recommended every year.  Tetanus, diphtheria, and acellular pertussis (Tdap,  Td) vaccine. You may need a Td booster every 10 years.  Zoster vaccine. You may need this after age 58.  Pneumococcal 13-valent conjugate (PCV13) vaccine. One dose is recommended after age 58.  Pneumococcal polysaccharide (PPSV23) vaccine. One dose is recommended after age 79. Talk to your health care provider about which screenings and vaccines you need and how often you need them. This information is not intended to replace advice given to you by your health care provider. Make sure you discuss any questions you have with your health care provider. Document Released: 08/27/2015 Document Revised: 04/19/2016 Document Reviewed: 06/01/2015 Elsevier Interactive Patient Education  2017 [employer] Prevention in the Home Falls can cause injuries. They can happen to people of all ages. There are many things you can do to make your home safe and to help prevent falls. What can I do on the outside of my home?  Regularly fix the edges of walkways and driveways and fix any cracks.  Remove anything that might make you trip as you walk through a door, such as a raised step or threshold.  Trim any bushes or trees on the path to your home.  Use bright outdoor lighting.  Clear any walking paths of anything that might make someone trip, such as rocks or tools.  Regularly check to see if handrails are loose or broken. Make sure that both sides of any steps have handrails.  Any raised decks and porches should have guardrails on the edges.  Have any leaves, snow, or ice cleared regularly.  Use sand or salt on walking paths during winter.  Clean up any spills in your garage right away. This includes oil or grease spills. What can I do in the bathroom?  Use night lights.  Install grab bars by the toilet and in the tub and shower. Do not use towel bars as grab bars.  Use non-skid mats or decals in the tub or shower.  If you need to sit down in the shower, use a plastic, non-slip  stool.  Keep the floor dry. Clean up any water that spills on the floor as soon as it happens.  Remove soap buildup in the tub or shower regularly.  Attach bath mats securely with double-sided non-slip rug tape.  Do not have throw rugs and other things on the floor that can make you trip. What can I do in the bedroom?  Use night lights.  Make sure that you have a light by your bed that is easy to reach.  Do not use any sheets or blankets that are too big for your bed. They should not hang down onto the floor.  Have a firm chair that has side arms. You can use this for support while you get dressed.  Do not have throw rugs and other things on the floor that can make you trip. What can I do in the kitchen?  Clean up any spills right away.  Avoid walking on wet floors.  Keep items that you use a lot in easy-to-reach places.  If you need to reach something above you, use a strong step stool that has a grab bar.  Keep electrical  cords out of the way.  Do not use floor polish or wax that makes floors slippery. If you must use wax, use non-skid floor wax.  Do not have throw rugs and other things on the floor that can make you trip. What can I do with my stairs?  Do not leave any items on the stairs.  Make sure that there are handrails on both sides of the stairs and use them. Fix handrails that are broken or loose. Make sure that handrails are as long as the stairways.  Check any carpeting to make sure that it is firmly attached to the stairs. Fix any carpet that is loose or worn.  Avoid having throw rugs at the top or bottom of the stairs. If you do have throw rugs, attach them to the floor with carpet tape.  Make sure that you have a light switch at the top of the stairs and the bottom of the stairs. If you do not have them, ask someone to add them for you. What else can I do to help prevent falls?  Wear shoes that:  Do not have high heels.  Have rubber bottoms.  Are  comfortable and fit you well.  Are closed at the toe. Do not wear sandals.  If you use a stepladder:  Make sure that it is fully opened. Do not climb a closed stepladder.  Make sure that both sides of the stepladder are locked into place.  Ask someone to hold it for you, if possible.  Clearly mark and make sure that you can see:  Any grab bars or handrails.  First and last steps.  Where the edge of each step is.  Use tools that help you move around (mobility aids) if they are needed. These include:  Canes.  Walkers.  Scooters.  Crutches.  Turn on the lights when you go into a dark area. Replace any light bulbs as soon as they burn out.  Set up your furniture so you have a clear path. Avoid moving your furniture around.  If any of your floors are uneven, fix them.  If there are any pets around you, be aware of where they are.  Review your medicines with your doctor. Some medicines can make you feel dizzy. This can increase your chance of falling. Ask your doctor what other things that you can do to help prevent falls. This information is not intended to replace advice given to you by your health care provider. Make sure you discuss any questions you have with your health care provider. Document Released: 05/27/2009 Document Revised: 01/06/2016 Document Reviewed: 09/04/2014 Elsevier Interactive Patient Education  2017 [employer].

## 2020-09-14 NOTE — Progress Notes (Signed)
Subjective:   John Graham is a 75 y.o. male who presents for an Initial Medicare Annual Wellness Visit.  I connected with John Graham  today by telephone and verified that I am speaking with the correct person using two identifiers. Location patient: home Location provider: work Persons participating in the virtual visit: patient, provider.   I discussed the limitations, risks, security and privacy concerns of performing an evaluation and management service by telephone and the availability of in person appointments. I also discussed with the patient that there may be a patient responsible charge related to this service. The patient expressed understanding and verbally consented to this telephonic visit.    Interactive audio and video telecommunications were attempted between this provider and patient, however failed, due to patient having technical difficulties OR patient did not have access to video capability.  We continued and completed visit with audio only.      Review of Systems    N/A  Cardiac Risk Factors include: advanced age (>95men, >38 women);male gender;dyslipidemia;hypertension     Objective:    Today's Vitals   There is no height or weight on file to calculate BMI.  Advanced Directives 09/14/2020 09/13/2017  Does Patient Have a Medical Advance Directive? Yes Yes  Type of Paramedic of [place];Living will [place];Living will  Does patient want to make changes to medical advance directive? No - Patient declined -  Copy of [place] in Chart? No - copy requested No - copy requested    Current Medications (verified) Outpatient Encounter Medications as of 09/14/2020  Medication Sig  . CALCIUM-MAGNESIUM-VITAMIN D PO Take by mouth. With Zinc-Take one pill daily  . desoximetasone (TOPICORT) 0.25 % cream APPLY TOPICALLY TO AFFECTED AREAS 2 TIMES DAILY  . metoprolol tartrate (LOPRESSOR) 25 MG tablet TAKE 1  TABLET BY MOUTH TWICE A DAY  . Multiple Vitamins-Minerals (CENTRUM PO) Take 1 tablet by mouth every morning.  . sertraline (ZOLOFT) 50 MG tablet TAKE 1 TABLET BY MOUTH EVERY DAY  . simvastatin (ZOCOR) 20 MG tablet TAKE 1 TABLET BY MOUTH EVERY DAY IN THE EVENING   Facility-Administered Encounter Medications as of 09/14/2020  Medication  . 0.9 %  sodium chloride infusion    Allergies (verified) Patient has no known allergies.   History: Past Medical History:  Diagnosis Date  . Anxiety    PTSD  . Hypercholesteremia   . Hyperlipidemia   . Hypertension    Past Surgical History:  Procedure Laterality Date  . COLONOSCOPY    . DENTAL SURGERY     8 dental implants  . POLYPECTOMY     Family History  Problem Relation Age of Onset  . Pancreatic cancer Mother   . Cancer Father        Lymph Nodes  . Colon cancer Neg Hx    Social History   Socioeconomic History  . Marital status: Married    Spouse name: Not on file  . Number of children: Not on file  . Years of education: Not on file  . Highest education level: Not on file  Occupational History  . Not on file  Tobacco Use  . Smoking status: Current Every Day Smoker    Packs/day: 1.00    Years: 53.00    Pack years: 53.00    Types: Cigarettes  . Smokeless tobacco: Never Used  Vaping Use  . Vaping Use: Never used  Substance and Sexual Activity  . Alcohol use: No  .  Drug use: No  . Sexual activity: Not on file  Other Topics Concern  . Not on file  Social History Narrative   ** Merged History Encounter **       Social Determinants of Health   Financial Resource Strain: Low Risk   . Difficulty of Paying Living Expenses: Not hard at all  Food Insecurity: No Food Insecurity  . Worried About Charity fundraiser in the Last Year: Never true  . Ran Out of Food in the Last Year: Never true  Transportation Needs: No Transportation Needs  . Lack of Transportation (Medical): No  . Lack of Transportation (Non-Medical): No   Physical Activity: Inactive  . Days of Exercise per Week: 0 days  . Minutes of Exercise per Session: 0 min  Stress: No Stress Concern Present  . Feeling of Stress : Not at all  Social Connections: Socially Isolated  . Frequency of Communication with Friends and Family: Once a week  . Frequency of Social Gatherings with Friends and Family: Never  . Attends Religious Services: Never  . Active Member of Clubs or Organizations: No  . Attends Archivist Meetings: Never  . Marital Status: Married    Tobacco Counseling Ready to quit: Not Answered Counseling given: Not Answered   Clinical Intake:  Pre-visit preparation completed: Yes  Pain : No/denies pain     Nutritional Risks: None Diabetes: No  How often do you need to have someone help you when you read instructions, pamphlets, or other written materials from your doctor or pharmacy?: 1 - Never What is the last grade level you completed in school?: college  Diabetic? No   Interpreter Needed?: No  Information entered by :: [facility] Daily Living In your present state of health, do you have any difficulty performing the following activities: 09/14/2020  Hearing? Y  Comment has some hearing loss. Plans to get hearing test soon  Vision? N  Difficulty concentrating or making decisions? N  Walking or climbing stairs? N  Dressing or bathing? N  Doing errands, shopping? N  Preparing Food and eating ? N  Using the Toilet? N  In the past six months, have you accidently leaked urine? N  Do you have problems with loss of bowel control? N  Managing your Medications? N  Managing your Finances? N  Housekeeping or managing your Housekeeping? N  Some recent data might be hidden    Patient Care Team: [name], MD as PCP - General  Indicate any recent Medical Services you may have received from other than [facility] providers in the past year (date may be approximate).     Assessment:   This  is a routine wellness examination for [facility].  Hearing/Vision screen  Hearing Screening   125Hz  250Hz  500Hz  1000Hz  2000Hz  3000Hz  4000Hz  6000Hz  8000Hz   Right ear:           Left ear:           Vision Screening Comments: Patient states has not had eye examined in a few years.  Dietary issues and exercise activities discussed: Current Exercise Habits: Home exercise routine;The patient does not participate in regular exercise at present  Goals    . Exercise 150 min/wk Moderate Activity      Depression Screen PHQ 2/9 Scores 09/14/2020 06/04/2018 05/14/2017 05/08/2016 04/06/2015 01/26/2014  PHQ - 2 Score 0 0 0 0 0 0  PHQ- 9 Score 0 - - - - -    Fall  Risk Fall Risk  09/14/2020 06/04/2018 05/14/2017 05/08/2016 04/06/2015  Falls in the past year? 0 No No No No  Number falls in past yr: 0 - - - -  Injury with Fall? 0 - - - -  Risk for fall due to : No Fall Risks - - - -  Follow up Falls evaluation completed;Falls prevention discussed - - - -    FALL RISK PREVENTION PERTAINING TO THE HOME:  Any stairs in or around the home? No  If so, are there any without handrails? No  Home free of loose throw rugs in walkways, pet beds, electrical cords, etc? Yes  Adequate lighting in your home to reduce risk of falls? Yes   ASSISTIVE DEVICES UTILIZED TO PREVENT FALLS:  Life alert? No  Use of a cane, walker or w/c? No  Grab bars in the bathroom? Yes  Shower chair or bench in shower? No  Elevated toilet seat or a handicapped toilet? No   Cognitive Function:   Normal cognitive status assessed by direct observation by this Nurse Health Advisor. No abnormalities found.        Immunizations Immunization History  Administered Date(s) Administered  . Fluad Quad(high Dose 65+) 06/28/2020  . Influenza Split 05/22/2011, 05/24/2012  . Influenza, High Dose Seasonal PF 06/06/2013, 04/04/2017, 06/04/2018, 04/29/2019  . Influenza,inj,Quad PF,6+ Mos 04/24/2014  . Influenza-Unspecified 05/26/2019  .  PFIZER(Purple Top)SARS-COV-2 Vaccination 10/09/2019, 11/07/2019, 07/21/2020  . Pneumococcal Conjugate-13 01/04/2012, 04/06/2015  . Pneumococcal Polysaccharide-23 05/14/2017  . Tdap 01/04/2012  . Zoster 06/24/2012    TDAP status: Up to date  Flu Vaccine status: Up to date  Pneumococcal vaccine status: Up to date  Covid-19 vaccine status: Completed vaccines  Qualifies for Shingles Vaccine? Yes   Zostavax completed Yes   Shingrix Completed?: No.    Education has been provided regarding the importance of this vaccine. Patient has been advised to call insurance company to determine out of pocket expense if they have not yet received this vaccine. Advised may also receive vaccine at local pharmacy or Health Dept. Verbalized acceptance and understanding.  Screening Tests Health Maintenance  Topic Date Due  . COLONOSCOPY (Pts 45-63yrs Insurance coverage will need to be confirmed)  09/14/2021 (Originally 09/13/2020)  . TETANUS/TDAP  01/03/2022  . INFLUENZA VACCINE  Completed  . COVID-19 Vaccine  Completed  . Hepatitis C Screening  Completed  . PNA vac Low Risk Adult  Completed    Health Maintenance  There are no preventive care reminders to display for this patient.  Colorectal cancer screening: Referral to GI placed 09/14/2020. Pt aware the office will call re: appt.  Lung Cancer Screening: (Low Dose CT Chest recommended if Age 39-80 years, 30 pack-year currently smoking OR have quit w/in 15years.) does qualify.   Lung Cancer Screening Referral: Yes   Additional Screening:  Hepatitis C Screening: does qualify; Completed 05/14/2017  Vision Screening: Recommended annual ophthalmology exams for early detection of glaucoma and other disorders of the eye. Is the patient up to date with their annual eye exam?  No  Who is the provider or what is the name of the office in which the patient attends annual eye exams? Dr.Groat  If pt is not established with a provider, would they like to  be referred to a provider to establish care? No .   Dental Screening: Recommended annual dental exams for proper oral hygiene  Community Resource Referral / Chronic Care Management: CRR required this visit?  No   CCM required this  visit?  No      Plan:     I have personally reviewed and noted the following in the patient's chart:   . Medical and social history . Use of alcohol, tobacco or illicit drugs  . Current medications and supplements . Functional ability and status . Nutritional status . Physical activity . Advanced directives . List of other physicians . Hospitalizations, surgeries, and ER visits in previous 12 months . Vitals . Screenings to include cognitive, depression, and falls . Referrals and appointments  In addition, I have reviewed and discussed with patient certain preventive protocols, quality metrics, and best practice recommendations. A written personalized care plan for preventive services as well as general preventive health recommendations were provided to patient.     Ofilia Neas, LPN   03/17/7206   Nurse Notes: None

## 2020-10-04 ENCOUNTER — Other Ambulatory Visit: Payer: Self-pay | Admitting: *Deleted

## 2020-10-04 DIAGNOSIS — F17210 Nicotine dependence, cigarettes, uncomplicated: Secondary | ICD-10-CM

## 2020-10-04 DIAGNOSIS — Z87891 Personal history of nicotine dependence: Secondary | ICD-10-CM

## 2020-10-12 ENCOUNTER — Telehealth: Payer: Self-pay | Admitting: Family Medicine

## 2020-10-12 NOTE — Progress Notes (Signed)
  Chronic Care Management   Outreach Note  10/12/2020 Name: John Graham MRN: [id number] DOB: 01-Apr-1946  Referred by: [name], MD Reason for referral : No chief complaint on file.   An unsuccessful telephone outreach was attempted today. The patient was referred to the pharmacist for assistance with care management and care coordination.   Follow Up Plan:   Carley Perdue [employer] Scheduler

## 2020-10-12 NOTE — Progress Notes (Signed)
  Chronic Care Management   Note  10/12/2020 Name: John Graham MRN: [id number] DOB: Oct 03, 1945  John Graham is a 75 y.o. year old male who is a primary care patient of Burchette, Alinda Sierras, MD. I reached out to [name] by phone today in response to a referral sent by John Graham's PCP, Elease Hashimoto, Alinda Sierras, MD.   [name] was given information about Chronic Care Management services today including:  1. CCM service includes personalized support from designated clinical staff supervised by his physician, including individualized plan of care and coordination with other care providers 2. 24/7 contact phone numbers for assistance for urgent and routine care needs. 3. Service will only be billed when office clinical staff spend 20 minutes or more in a month to coordinate care. 4. Only one practitioner may furnish and bill the service in a calendar month. 5. The patient may stop CCM services at any time (effective at the end of the month) by phone call to the office staff.   Patient agreed to services and verbal consent obtained.   Follow up plan:   Carley Perdue [employer] Scheduler

## 2020-10-19 ENCOUNTER — Other Ambulatory Visit: Payer: Self-pay

## 2020-10-19 ENCOUNTER — Ambulatory Visit (INDEPENDENT_AMBULATORY_CARE_PROVIDER_SITE_OTHER)
Admission: RE | Admit: 2020-10-19 | Discharge: 2020-10-19 | Disposition: A | Discharge status: Home / Self Care | Payer: Medicare Other | Source: Ambulatory Visit | Attending: Cardiology | Admitting: Cardiology

## 2020-10-19 DIAGNOSIS — Z87891 Personal history of nicotine dependence: Secondary | ICD-10-CM

## 2020-10-19 DIAGNOSIS — F17210 Nicotine dependence, cigarettes, uncomplicated: Secondary | ICD-10-CM | POA: Diagnosis not present

## 2020-10-20 ENCOUNTER — Telehealth: Payer: Self-pay | Admitting: Acute Care

## 2020-10-20 DIAGNOSIS — Z87891 Personal history of nicotine dependence: Secondary | ICD-10-CM

## 2020-10-20 DIAGNOSIS — F17210 Nicotine dependence, cigarettes, uncomplicated: Secondary | ICD-10-CM

## 2020-10-20 NOTE — Telephone Encounter (Signed)
Call report from [facility] with [facility] radiology:  IMPRESSION: 1. Enlarging right lower lobe pulmonary nodules, as above, categorized as Lung-RADS 4AS, suspicious. Follow up low-dose chest CT without contrast in 3 months (please use the following order, "CT CHEST LCS NODULE FOLLOW-UP W/O CM") is recommended. Alternatively, PET may be considered when there is a solid component 2mm or larger. 2. The "S" modifier above refers to potentially clinically significant non lung cancer related findings. Specifically, there is aortic atherosclerosis, in addition to left main and 3 vessel coronary artery disease. Assessment for potential risk factor modification, dietary therapy or pharmacologic therapy may be warranted, if clinically indicated. 3. Mild diffuse bronchial wall thickening with very mild centrilobular and paraseptal emphysema; imaging findings suggestive of underlying COPD. 4. There are mild calcifications of the aortic valve. Echocardiographic correlation for evaluation of potential valvular dysfunction may be warranted if clinically indicated.  Noted will route message to SG.

## 2020-10-21 NOTE — Telephone Encounter (Signed)
We will call patient through the screening program. Thanks so much

## 2020-10-26 ENCOUNTER — Encounter: Payer: Self-pay | Admitting: Internal Medicine

## 2020-10-26 NOTE — Telephone Encounter (Signed)
I called the patient to give him the results of his scan.  There was no answer.  I have left a HIPAA compliant message asking the patient to call the office with the contact information so that we can give the patient the results of his CT scan. John Graham, he will need a 40-month follow-up.  I will continue to watch this week however if he calls the office and you have the opportunity to speak to him please let him know 47-month follow-up. Thank you so much.

## 2020-11-10 NOTE — Addendum Note (Signed)
Addended by: Doroteo Glassman D on: 11/10/2020 09:56 AM   Modules accepted: Orders

## 2020-11-10 NOTE — Telephone Encounter (Signed)
Spoke with pt and advised of CT results per Eric Form, NP. Order placed for 3 mth f/u low dose nodule CT. Results faxed to PCP. Pt verbalized understanding. Nothing further needed.

## 2020-11-11 NOTE — Progress Notes (Signed)
Results have been called to the patient by Doroteo Glassman RN.  See telephone note dated 3/9

## 2020-12-02 ENCOUNTER — Telehealth: Payer: Self-pay | Admitting: Pharmacist

## 2020-12-02 NOTE — Chronic Care Management (AMB) (Signed)
    Chronic Care Management Pharmacy Assistant   Name: TRAVIAN KERNER  MRN: [id number] DOB: [birth year]  Reason for Encounter: Chart Prep for CPP  Visit on 12/06/2020   Conditions to be addressed/monitored: HTN and HLD  Primary concerns for visit include: Hypertension  Recent office visits:  . 02.01.2022 Medicare Wellness Visit . 11.15.2021 [name], MD Family Medicine . Medication Changed -discontinued  Recent consult visits:  None  Hospital visits:  None in previous 6 months   Patient Questions:   1. Have you seen any other providers since your last visit? No 2. Any changes in your medications or health? No 3. Any side effects from any medications? No 4. Do you have an symptoms or problems not managed by your medications? No 5. Any concerns about your health right now? No 6. Has your provider asked that you check blood pressure, blood sugar, or follow special diet at home? No 7. Do you get any type of exercise on a regular basis? No 8. Can you think of a goal you would like to reach for your health? No 9. Do you have any problems getting your medications? No 10. Is there anything that you would like to discuss during the appointment? No  The patient was asked to please bring medications, blood pressure/ blood sugar log, and supplements to his appointment.  Medication Dispensed  Quantity  Metoprolol 25 mg 02.28.2022 180  Sertraline HCL 50 mg 03.19.2022 90  Simvastatin 20 mg 03.03.2022 90    Medications: Outpatient Encounter Medications as of 12/02/2020  Medication Sig  . CALCIUM-MAGNESIUM-VITAMIN D PO Take by mouth. With Zinc-Take one pill daily  . desoximetasone (TOPICORT) 0.25 % cream APPLY TOPICALLY TO AFFECTED AREAS 2 TIMES DAILY  . metoprolol tartrate (LOPRESSOR) 25 MG tablet TAKE 1 TABLET BY MOUTH TWICE A DAY  . Multiple Vitamins-Minerals (CENTRUM PO) Take 1 tablet by mouth every morning.  . sertraline (ZOLOFT) 50 MG tablet TAKE 1 TABLET BY MOUTH  EVERY DAY  . simvastatin (ZOCOR) 20 MG tablet TAKE 1 TABLET BY MOUTH EVERY DAY IN THE EVENING   Facility-Administered Encounter Medications as of 12/02/2020  Medication  . 0.9 %  sodium chloride infusion    Star Rating Drugs:  [place]  Simvastatin 03.03.2022 90 [facility]    Amilia Revonda Standard, [place] [phone]

## 2020-12-06 ENCOUNTER — Ambulatory Visit (INDEPENDENT_AMBULATORY_CARE_PROVIDER_SITE_OTHER): Payer: Medicare Other | Admitting: Pharmacist

## 2020-12-06 DIAGNOSIS — I10 Essential (primary) hypertension: Secondary | ICD-10-CM

## 2020-12-06 DIAGNOSIS — E785 Hyperlipidemia, unspecified: Secondary | ICD-10-CM

## 2020-12-06 DIAGNOSIS — F411 Generalized anxiety disorder: Secondary | ICD-10-CM

## 2020-12-06 NOTE — Progress Notes (Signed)
Chronic Care Management Pharmacy Note  12/29/2020 Name:  John Graham MRN:  [id number] DOB:  Mar 04, 1946  Subjective: John Graham is an 75 y.o. year old male who is a primary patient of Burchette, Alinda Sierras, MD.  The CCM team was consulted for assistance with disease management and care coordination needs.    Engaged with patient by telephone for initial visit in response to provider referral for pharmacy case management and/or care coordination services.   Consent to Services:  The patient was given the following information about Chronic Care Management services today, agreed to services, and gave verbal consent: 1. CCM service includes personalized support from designated clinical staff supervised by the primary care provider, including individualized plan of care and coordination with other care providers 2. 24/7 contact phone numbers for assistance for urgent and routine care needs. 3. Service will only be billed when office clinical staff spend 20 minutes or more in a month to coordinate care. 4. Only one practitioner may furnish and bill the service in a calendar month. 5.The patient may stop CCM services at any time (effective at the end of the month) by phone call to the office staff. 6. The patient will be responsible for cost sharing (co-pay) of up to 20% of the service fee (after annual deductible is met). Patient agreed to services and consent obtained.  Patient Care Team: [name], MD as PCP - General Viona Gilmore, [facility] as Pharmacist (Pharmacist)  Recent office visits:  02.01.2022 Medicare Wellness Visit  11.15.2021 [name], MD: Patient presented for knee pain. Discontinued ibuprofen and turmeric. Recommended Tylenol or topicals.   Recent consult visits: None  Hospital visits: None in previous 6 months  Objective:  Lab Results  Component Value Date   CREATININE 1.07 06/28/2020   BUN 17 06/28/2020   GFR 63.79 06/04/2018   GFRNONAA 84 (L)  12/09/2011   GFRAA >90 12/09/2011   NA 141 06/28/2020   K 4.1 06/28/2020   CALCIUM 9.2 06/28/2020   CO2 28 06/28/2020   GLUCOSE 115 (H) 06/28/2020    Lab Results  Component Value Date/Time   GFR 63.79 06/04/2018 05:19 PM   GFR 79.12 05/14/2017 02:45 PM    Last diabetic Eye exam: No results found for: HMDIABEYEEXA  Last diabetic Foot exam: No results found for: HMDIABFOOTEX   Lab Results  Component Value Date   CHOL 170 06/28/2020   HDL 39 (L) 06/28/2020   LDLCALC 108 (H) 06/28/2020   LDLDIRECT 89.0 04/06/2015   TRIG 120 06/28/2020   CHOLHDL 4.4 06/28/2020    Hepatic Function Latest Ref Rng & Units 06/28/2020 06/04/2018 05/14/2017  Total Protein 6.1 - 8.1 g/dL 7.0 7.4 7.1  Albumin 3.5 - 5.2 g/dL - 4.2 4.4  AST 10 - 35 U/L '16 16 14  ' ALT 9 - 46 U/L '11 13 12  ' Alk Phosphatase 39 - 117 U/L - 80 90  Total Bilirubin 0.2 - 1.2 mg/dL 0.7 0.6 0.6  Bilirubin, Direct 0.0 - 0.2 mg/dL 0.2 0.1 0.1    Lab Results  Component Value Date/Time   TSH 0.63 05/14/2017 02:45 PM   TSH 0.52 04/06/2015 12:02 PM    CBC Latest Ref Rng & Units 05/14/2017 04/06/2015 05/08/2012  WBC 4.0 - 10.5 K/uL 7.4 6.9 7.2  Hemoglobin 13.0 - 17.0 g/dL 14.9 14.3 13.5  Hematocrit 39.0 - 52.0 % 43.8 41.7 40.5  Platelets 150.0 - 400.0 K/uL 189.0 173.0 172.0    No results found for:  VD25OH  Clinical ASCVD: No  The ASCVD Risk score Mikey Bussing DC Jr., et al., 2013) failed to calculate for the following reasons:   The systolic blood pressure is missing    Depression screen [facility] 2/9 09/14/2020 06/04/2018 05/14/2017  Decreased Interest 0 0 0  Down, Depressed, Hopeless 0 0 0  PHQ - 2 Score 0 0 0  Altered sleeping 0 - -  Tired, decreased energy 0 - -  Change in appetite 0 - -  Feeling bad or failure about yourself  0 - -  Trouble concentrating 0 - -  Moving slowly or fidgety/restless 0 - -  Suicidal thoughts 0 - -  PHQ-9 Score 0 - -  Difficult doing work/chores Not difficult at all - -     Social History   Tobacco  Use  Smoking Status Current Every Day Smoker  . Packs/day: 1.00  . Years: 53.00  . Pack years: 53.00  . Types: Cigarettes  Smokeless Tobacco Never Used   BP Readings from Last 3 Encounters:  06/28/20 124/70  06/04/18 120/78  09/13/17 109/68   Pulse Readings from Last 3 Encounters:  06/28/20 73  06/04/18 61  09/13/17 63   Wt Readings from Last 3 Encounters:  06/28/20 222 lb (100.7 kg)  06/04/18 208 lb 12.8 oz (94.7 kg)  09/13/17 209 lb (94.8 kg)   BMI Readings from Last 3 Encounters:  06/28/20 30.96 kg/m  06/04/18 29.12 kg/m  09/13/17 29.15 kg/m    Assessment/Interventions: Review of patient past medical history, allergies, medications, health status, including review of consultants reports, laboratory and other test data, was performed as part of comprehensive evaluation and provision of chronic care management services.   SDOH:  (Social Determinants of Health) assessments and interventions performed: Yes SDOH Interventions   Flowsheet Row Most Recent Value  SDOH Interventions   Financial Strain Interventions Intervention Not Indicated  Transportation Interventions Intervention Not Indicated     SDOH Screenings   Alcohol Screen: Low Risk   . Last Alcohol Screening Score (AUDIT): 0  Depression (PHQ2-9): Low Risk   . PHQ-2 Score: 0  Financial Resource Strain: Low Risk   . Difficulty of Paying Living Expenses: Not hard at all  Food Insecurity: No Food Insecurity  . Worried About Charity fundraiser in the Last Year: Never true  . Ran Out of Food in the Last Year: Never true  Housing: Low Risk   . Last Housing Risk Score: 0  Physical Activity: Inactive  . Days of Exercise per Week: 0 days  . Minutes of Exercise per Session: 0 min  Social Connections: Socially Isolated  . Frequency of Communication with Friends and Family: Once a week  . Frequency of Social Gatherings with Friends and Family: Never  . Attends Religious Services: Never  . Active Member of Clubs  or Organizations: No  . Attends Archivist Meetings: Never  . Marital Status: Married  Stress: No Stress Concern Present  . Feeling of Stress : Not at all  Tobacco Use: High Risk  . Smoking Tobacco Use: Current Every Day Smoker  . Smokeless Tobacco Use: Never Used  Transportation Needs: No Transportation Needs  . Lack of Transportation (Medical): No  . Lack of Transportation (Non-Medical): No   Patient has been taking his medications for a long time and denies any current problems.  Patient is retired and he pretty much stays at home all the time. He only goes out of the house once every 2 weeks to  go to [employer]. He gets up around 9 am for breakfast and then goes back to bed until 2-2:30pm and then gets up and goes to bed around midnight.  Patient works out in the yard when its good weather and walks through the neighborhood some. He tries to walk every day for half an hour as he lives in a closed [place].  Patient does not go to restaurants or get takeout. Patient eats mashed potatoes, corn, salisbury steak, not much beef, and mostly chicken. He eats a lot of heat meats (frozen meals) and not a lot of sweets. He doesn't drink alcohol or coffee, and usually has water and regular coke all day long.   CCM Care Plan  No Known Allergies  Medications Reviewed Today    Reviewed by Viona Gilmore, [facility] (Pharmacist) on 12/06/20 at 1428  Med List Status: <None>  Medication Order Taking? Sig Documenting Provider Last Dose Status Informant  0.9 %  sodium chloride infusion [id number]   Irene Shipper, MD  Active   CALCIUM-MAGNESIUM-VITAMIN D PO [id number] Yes Take by mouth. With Zinc-Take one pill daily [provider] Taking Active   metoprolol tartrate (LOPRESSOR) 25 MG tablet [id number] Yes TAKE 1 TABLET BY MOUTH TWICE A DAY Burchette, Alinda Sierras, MD Taking Active   Multiple Vitamins-Minerals (CENTRUM PO) [id number] Yes Take 1 tablet by mouth every morning. [provider] Taking Active Multiple Informants  sertraline (ZOLOFT) 50 MG tablet [id number] Yes TAKE 1 TABLET BY MOUTH EVERY DAY Burchette, Alinda Sierras, MD Taking Active   simvastatin (ZOCOR) 20 MG tablet [id number] Yes TAKE 1 TABLET BY MOUTH EVERY DAY IN THE EVENING Burchette, Alinda Sierras, MD Taking Active           Patient Active Problem List   Diagnosis Date Noted  . Localized primary osteoarthritis of carpometacarpal joint of left thumb 01/25/2017  . Psoriasiform eruption 02/03/2014  . Anxiety state 07/29/2013  . SKIN LESION 12/30/2009  . Bilateral primary osteoarthritis of knee 08/23/2009  . Hyperlipidemia 03/22/2009  . Essential hypertension 03/22/2009  . COLONIC POLYPS, HX OF 03/22/2009    Immunization History  Administered Date(s) Administered  . Fluad Quad(high Dose 65+) 06/28/2020  . Influenza Split 05/22/2011, 05/24/2012  . Influenza, High Dose Seasonal PF 06/06/2013, 04/04/2017, 06/04/2018, 04/29/2019  . Influenza,inj,Quad PF,6+ Mos 04/24/2014  . Influenza-Unspecified 05/26/2019  . PFIZER(Purple Top)SARS-COV-2 Vaccination 10/09/2019, 11/07/2019, 07/21/2020  . Pneumococcal Conjugate-13 01/04/2012, 04/06/2015  . Pneumococcal Polysaccharide-23 05/14/2017  . Tdap 01/04/2012  . Zoster 06/24/2012    Conditions to be addressed/monitored:  Hypertension, Hyperlipidemia, Anxiety, Osteoarthritis and Tobacco use  Care Plan : [place]  Updates made by Viona Gilmore, [place] since 12/29/2020 12:00 AM    Problem: Problem: Hypertension, Hyperlipidemia, Anxiety, Osteoarthritis and Tobacco use     Long-Range Goal: Patient-Specific Goal   Start Date: 12/06/2020  Expected End Date: 12/06/2021  This Visit's Progress: On track  Priority: High  Note:   Current Barriers:  . Unable to independently monitor therapeutic efficacy  Pharmacist Clinical Goal(s):  Marland Kitchen Patient will achieve adherence to monitoring guidelines and medication adherence to achieve therapeutic efficacy  through collaboration with PharmD and provider.   Interventions: . 1:1 collaboration with [name], MD regarding development and update of comprehensive plan of care as evidenced by provider attestation and co-signature . Inter-disciplinary care team collaboration (see longitudinal plan of care) . Comprehensive medication review performed; medication list updated in electronic medical record  Hypertension (BP goal <140/90) -Controlled -  Current treatment: . Metoprolol tartrate 25 mg 1 tablet twice daily -Medications previously tried: n/a  -Current home readings: 122/83 (checking 2-3 times a week) -Current dietary habits: mostly frozen or easy to fix foods -Current exercise habits: tries to walk for half an hour each day -Denies hypotensive/hypertensive symptoms -Educated on Daily salt intake goal < 2300 mg; Exercise goal of 150 minutes per week; Importance of home blood pressure monitoring; -Counseled to monitor BP at home at least weekly, document, and provide log at future appointments -Counseled on diet and exercise extensively Recommended to continue current medication  Hyperlipidemia: (LDL goal < 100) -Uncontrolled -Current treatment: . Simvastatin 20 mg 1 tablet every day in the evening -Medications previously tried: none  -Current dietary patterns: doesn't eat fried foods, doesn't cook with oil -Current exercise habits: tries to walk for half an hour each day -Educated on Cholesterol goals;  Benefits of statin for ASCVD risk reduction; Exercise goal of 150 minutes per week; -Collaborated with PCP to increase dose of simvastatin per previous note.  Anxiety (Goal: minimize symptoms) -Controlled -Current treatment: . Sertraline 50 mg 1 tablet daily -Medications previously tried/failed: none -GAD7: 0 -Educated on Benefits of medication for symptom control -Recommended to continue current medication Counseled on may not be needed long term but patient prefers to  keep for now.  Osteoarthritis (Goal: minimize symptoms) -Controlled -Current treatment  . No medications -Medications previously tried: ibuprofen  -Recommended to continue as is  Health Maintenance -Vaccine gaps: shingrix -Current therapy:  . Calcium-mag-vitamin D-zinc daily . Multivitamin 1 tablet daily -Educated on Cost vs benefit of each product must be carefully weighed by individual consumer -Patient is satisfied with current therapy and denies issues -Recommended to continue current medication  Patient Goals/Self-Care Activities . Patient will:  - take medications as prescribed check blood pressure at least weekly, document, and provide at future appointments target a minimum of 150 minutes of moderate intensity exercise weekly  Follow Up Plan: Telephone follow up appointment with care management team member scheduled for: 6 months       Medication Assistance: None required.  Patient affirms current coverage meets needs.  Patient's preferred pharmacy is:  [id number] [place], [place] - [address] [place] [address]Phone: [phone]: [phone] Uses pill box? Yes - AM/PM (fills it on Saturdays) Pt endorses 100% compliance  We discussed: Current pharmacy is preferred with insurance plan and patient is satisfied with pharmacy services Patient decided to: Continue current medication management strategy  Care Plan and Follow Up Patient Decision:  Patient agrees to Care Plan and Follow-up.  Plan: Telephone follow up appointment with care management team member scheduled for:  6 months  MJeni Salles PharmD BWray Community District HospitalClinical Pharmacist [place]au[phone] Cholesterol medicine - anytime  Never around leave a message

## 2020-12-29 ENCOUNTER — Other Ambulatory Visit: Payer: Self-pay | Admitting: Family Medicine

## 2020-12-29 MED ORDER — SIMVASTATIN 40 MG PO TABS: 40.0000 mg | ORAL_TABLET | Freq: Every day | ORAL | 3 refills | Status: DC

## 2020-12-29 NOTE — Patient Instructions (Addendum)
Hi John Graham,   It was great to get to meet you over the telephone! Below is a summary of some of the topics we discussed.   Please reach out to me if you have any questions or need anything before our follow up!  Best, Maddie  Jeni Salles, PharmD, [place] [facility] [place] [phone]  Visit Information  Goals Addressed   None    Patient Care Plan: CCM Pharmacy Care Plan    Problem Identified: Problem: Hypertension, Hyperlipidemia, Anxiety, Osteoarthritis and Tobacco use     Long-Range Goal: Patient-Specific Goal   Start Date: 12/06/2020  Expected End Date: 12/06/2021  This Visit's Progress: On track  Priority: High  Note:   Current Barriers:  . Unable to independently monitor therapeutic efficacy  Pharmacist Clinical Goal(s):  Marland Kitchen Patient will achieve adherence to monitoring guidelines and medication adherence to achieve therapeutic efficacy through collaboration with PharmD and provider.   Interventions: . 1:1 collaboration with John Post, MD regarding development and update of comprehensive plan of care as evidenced by provider attestation and co-signature . Inter-disciplinary care team collaboration (see longitudinal plan of care) . Comprehensive medication review performed; medication list updated in electronic medical record  Hypertension (BP goal <140/90) -Controlled -Current treatment: . Metoprolol tartrate 25 mg 1 tablet twice daily -Medications previously tried: n/a  -Current home readings: 122/83 (checking 2-3 times a week) -Current dietary habits: mostly frozen or easy to fix foods -Current exercise habits: tries to walk for half an hour each day -Denies hypotensive/hypertensive symptoms -Educated on Daily salt intake goal < 2300 mg; Exercise goal of 150 minutes per week; Importance of home blood pressure monitoring; -Counseled to monitor BP at home at least weekly, document, and provide log at future  appointments -Counseled on diet and exercise extensively Recommended to continue current medication  Hyperlipidemia: (LDL goal < 100) -Uncontrolled -Current treatment: . Simvastatin 20 mg 1 tablet every day in the evening -Medications previously tried: none  -Current dietary patterns: doesn't eat fried foods, doesn't cook with oil -Current exercise habits: tries to walk for half an hour each day -Educated on Cholesterol goals;  Benefits of statin for ASCVD risk reduction; Exercise goal of 150 minutes per week; -Collaborated with PCP to increase dose of simvastatin per previous note.  Anxiety (Goal: minimize symptoms) -Controlled -Current treatment: . Sertraline 50 mg 1 tablet daily -Medications previously tried/failed: none -GAD7: 0 -Educated on Benefits of medication for symptom control -Recommended to continue current medication Counseled on may not be needed long term but patient prefers to keep for now.  Osteoarthritis (Goal: minimize symptoms) -Controlled -Current treatment  . No medications -Medications previously tried: ibuprofen  -Recommended to continue as is  Health Maintenance -Vaccine gaps: shingrix -Current therapy:  . Calcium-mag-vitamin D-zinc daily . Multivitamin 1 tablet daily -Educated on Cost vs benefit of each product must be carefully weighed by individual consumer -Patient is satisfied with current therapy and denies issues -Recommended to continue current medication  Patient Goals/Self-Care Activities . Patient will:  - take medications as prescribed check blood pressure at least weekly, document, and provide at future appointments target a minimum of 150 minutes of moderate intensity exercise weekly  Follow Up Plan: Telephone follow up appointment with care management team member scheduled for: 6 months      [name] was given information about Chronic Care Management services today including:  1. CCM service includes personalized support  from designated clinical staff supervised by his physician, including individualized plan of  care and coordination with other care providers 2. 24/7 contact phone numbers for assistance for urgent and routine care needs. 3. Standard insurance, coinsurance, copays and deductibles apply for chronic care management only during months in which we provide at least 20 minutes of these services. Most insurances cover these services at 100%, however patients may be responsible for any copay, coinsurance and/or deductible if applicable. This service may help you avoid the need for more expensive face-to-face services. 4. Only one practitioner may furnish and bill the service in a calendar month. 5. The patient may stop CCM services at any time (effective at the end of the month) by phone call to the office staff.  Patient agreed to services and verbal consent obtained.   The patient verbalized understanding of instructions, educational materials, and care plan provided today and agreed to receive a mailed copy of patient instructions, educational materials, and care plan.  Telephone follow up appointment with pharmacy team member scheduled for: 6 months  John Graham, [facility]  High Cholesterol  High cholesterol is a condition in which the blood has high levels of a white, waxy substance similar to fat (cholesterol). The liver makes all the cholesterol that the body needs. The human body needs small amounts of cholesterol to help build cells. A person gets extra or excess cholesterol from the food that he or she eats. The blood carries cholesterol from the liver to the rest of the body. If you have high cholesterol, deposits (plaques) may build up on the walls of your arteries. Arteries are the blood vessels that carry blood away from your heart. These plaques make the arteries narrow and stiff. Cholesterol plaques increase your risk for heart attack and stroke. Work with your health care provider to keep  your cholesterol levels in a healthy range. What increases the risk? The following factors may make you more likely to develop this condition:  Eating foods that are high in animal fat (saturated fat) or cholesterol.  Being overweight.  Not getting enough exercise.  A family history of high cholesterol (familial hypercholesterolemia).  Use of tobacco products.  Having diabetes. What are the signs or symptoms? There are no symptoms of this condition. How is this diagnosed? This condition may be diagnosed based on the results of a blood test.  If you are older than 75 years of age, your health care provider may check your cholesterol levels every 4-6 years.  You may be checked more often if you have high cholesterol or other risk factors for heart disease. The blood test for cholesterol measures:  "Bad" cholesterol, or LDL cholesterol. This is the main type of cholesterol that causes heart disease. The desired level is less than 100 mg/dL.  "Good" cholesterol, or HDL cholesterol. HDL helps protect against heart disease by cleaning the arteries and carrying the LDL to the liver for processing. The desired level for HDL is 60 mg/dL or higher.  Triglycerides. These are fats that your body can store or burn for energy. The desired level is less than 150 mg/dL.  Total cholesterol. This measures the total amount of cholesterol in your blood and includes LDL, HDL, and triglycerides. The desired level is less than 200 mg/dL. How is this treated? This condition may be treated with:  Diet changes. You may be asked to eat foods that have more fiber and less saturated fats or added sugar.  Lifestyle changes. These may include regular exercise, maintaining a healthy weight, and quitting use of tobacco products.  Medicines. These are given when diet and lifestyle changes have not worked. You may be prescribed a statin medicine to help lower your cholesterol levels. Follow these instructions  at home: Eating and drinking  Eat a healthy, balanced diet. This diet includes: ? Daily servings of a variety of fresh, frozen, or canned fruits and vegetables. ? Daily servings of whole grain foods that are rich in fiber. ? Foods that are low in saturated fats and trans fats. These include poultry and fish without skin, lean cuts of meat, and low-fat dairy products. ? A variety of fish, especially oily fish that contain omega-3 fatty acids. Aim to eat fish at least 2 times a week.  Avoid foods and drinks that have added sugar.  Use healthy cooking methods, such as roasting, grilling, broiling, baking, poaching, steaming, and stir-frying. Do not fry your food except for stir-frying.   Lifestyle  Get regular exercise. Aim to exercise for a total of 150 minutes a week. Increase your activity level by doing activities such as gardening, walking, and taking the stairs.  Do not use any products that contain nicotine or tobacco, such as cigarettes, e-cigarettes, and chewing tobacco. If you need help quitting, ask your health care provider.   General instructions  Take over-the-counter and prescription medicines only as told by your health care provider.  Keep all follow-up visits as told by your health care provider. This is important. Where to find more information  American Heart Association: www.heart.org  National Heart, Lung, and Blood Institute: https://wilson-eaton.com/ Contact a health care provider if:  You have trouble achieving or maintaining a healthy diet or weight.  You are starting an exercise program.  You are unable to stop smoking. Get help right away if:  You have chest pain.  You have trouble breathing.  You have any symptoms of a stroke. "BE FAST" is an easy way to remember the main warning signs of a stroke: ? B - Balance. Signs are dizziness, sudden trouble walking, or loss of balance. ? E - Eyes. Signs are trouble seeing or a sudden change in vision. ? F - Face.  Signs are sudden weakness or numbness of the face, or the face or eyelid drooping on one side. ? A - Arms. Signs are weakness or numbness in an arm. This happens suddenly and usually on one side of the body. ? S - Speech. Signs are sudden trouble speaking, slurred speech, or trouble understanding what people say. ? T - Time. Time to call emergency services. Write down what time symptoms started.  You have other signs of a stroke, such as: ? A sudden, severe headache with no known cause. ? Nausea or vomiting. ? Seizure. These symptoms may represent a serious problem that is an emergency. Do not wait to see if the symptoms will go away. Get medical help right away. Call your local emergency services (911 in the [place] Do not drive yourself to the hospital. Summary  Cholesterol plaques increase your risk for heart attack and stroke. Work with your health care provider to keep your cholesterol levels in a healthy range.  Eat a healthy, balanced diet, get regular exercise, and maintain a healthy weight.  Do not use any products that contain nicotine or tobacco, such as cigarettes, e-cigarettes, and chewing tobacco.  Get help right away if you have any symptoms of a stroke. This information is not intended to replace advice given to you by your health care provider. Make sure you discuss any questions  you have with your health care provider. Document Revised: 06/30/2019 Document Reviewed: 06/30/2019 Elsevier Patient Education  2021 [employer].

## 2020-12-29 NOTE — Progress Notes (Signed)
Increasing simvastatin to 40 mg daily to tighten up LDL control.  Recommend 90-month follow-up to repeat liver and lipid panel

## 2020-12-30 ENCOUNTER — Telehealth: Payer: Self-pay | Admitting: Pharmacist

## 2020-12-30 NOTE — Telephone Encounter (Signed)
Spoke with patient to inform him of the medication change with simvastatin increasing to 40 mg daily. Instructed him to take 2 of the 20 mg tablets until he runs out and then can switch over the 40 mg tablets.  Scheduled 2 month follow up for repeat lipid panel and LFTs. Patient is aware.

## 2021-01-18 NOTE — Telephone Encounter (Signed)
Medication no longer on pt's med current list

## 2021-01-20 ENCOUNTER — Other Ambulatory Visit: Payer: Self-pay

## 2021-01-20 ENCOUNTER — Inpatient Hospital Stay: Admission: RE | Admit: 2021-01-20 | Payer: Medicare Other | Source: Ambulatory Visit

## 2021-01-20 ENCOUNTER — Ambulatory Visit (INDEPENDENT_AMBULATORY_CARE_PROVIDER_SITE_OTHER)
Admission: RE | Admit: 2021-01-20 | Discharge: 2021-01-20 | Disposition: A | Discharge status: Home / Self Care | Payer: Medicare Other | Source: Ambulatory Visit | Attending: Acute Care | Admitting: Acute Care

## 2021-01-20 DIAGNOSIS — J432 Centrilobular emphysema: Secondary | ICD-10-CM | POA: Diagnosis not present

## 2021-01-20 DIAGNOSIS — Z87891 Personal history of nicotine dependence: Secondary | ICD-10-CM | POA: Diagnosis not present

## 2021-01-20 DIAGNOSIS — I2510 Atherosclerotic heart disease of native coronary artery without angina pectoris: Secondary | ICD-10-CM | POA: Diagnosis not present

## 2021-01-20 DIAGNOSIS — F17210 Nicotine dependence, cigarettes, uncomplicated: Secondary | ICD-10-CM | POA: Diagnosis not present

## 2021-01-20 DIAGNOSIS — S2241XA Multiple fractures of ribs, right side, initial encounter for closed fracture: Secondary | ICD-10-CM | POA: Diagnosis not present

## 2021-01-20 DIAGNOSIS — I712 Thoracic aortic aneurysm, without rupture: Secondary | ICD-10-CM | POA: Diagnosis not present

## 2021-01-31 ENCOUNTER — Other Ambulatory Visit: Payer: Self-pay | Admitting: *Deleted

## 2021-01-31 DIAGNOSIS — F17210 Nicotine dependence, cigarettes, uncomplicated: Secondary | ICD-10-CM

## 2021-01-31 DIAGNOSIS — Z87891 Personal history of nicotine dependence: Secondary | ICD-10-CM

## 2021-02-25 ENCOUNTER — Other Ambulatory Visit: Payer: Self-pay

## 2021-02-28 ENCOUNTER — Other Ambulatory Visit: Payer: Self-pay

## 2021-02-28 ENCOUNTER — Other Ambulatory Visit (INDEPENDENT_AMBULATORY_CARE_PROVIDER_SITE_OTHER): Payer: Medicare Other

## 2021-02-28 ENCOUNTER — Other Ambulatory Visit: Payer: Self-pay | Admitting: Family Medicine

## 2021-02-28 DIAGNOSIS — E785 Hyperlipidemia, unspecified: Secondary | ICD-10-CM | POA: Diagnosis not present

## 2021-02-28 LAB — HEPATIC FUNCTION PANEL
ALT: 11 U/L (ref 0–53)
AST: 16 U/L (ref 0–37)
Albumin: 4.2 g/dL (ref 3.5–5.2)
Alkaline Phosphatase: 82 U/L (ref 39–117)
Bilirubin, Direct: 0.1 mg/dL (ref 0.0–0.3)
Total Bilirubin: 0.6 mg/dL (ref 0.2–1.2)
Total Protein: 6.8 g/dL (ref 6.0–8.3)

## 2021-02-28 LAB — LIPID PANEL
Cholesterol: 153 mg/dL (ref 0–200)
HDL: 35.1 mg/dL — ABNORMAL LOW (ref >39.00)
LDL Cholesterol: 91 mg/dL (ref 0–99)
NonHDL: 118.06
Total CHOL/HDL Ratio: 4
Triglycerides: 137 mg/dL (ref 0.0–149.0)
VLDL: 27.4 mg/dL (ref 0.0–40.0)

## 2021-03-09 ENCOUNTER — Telehealth: Payer: Self-pay | Admitting: Pharmacist

## 2021-03-09 NOTE — Chronic Care Management (AMB) (Signed)
    Chronic Care Management Pharmacy Assistant   Name: John Graham  MRN: [id number] DOB: 01-May-1946   Reason for Encounter: General Assessment Call    Conditions to be addressed/monitored: HLD  Recent office visits:  None  Recent consult visits:  None  Hospital visits:  None in previous 6 months  Medications: Outpatient Encounter Medications as of 03/09/2021  Medication Sig   CALCIUM-MAGNESIUM-VITAMIN D PO Take by mouth. With Zinc-Take one pill daily   metoprolol tartrate (LOPRESSOR) 25 MG tablet TAKE 1 TABLET BY MOUTH TWICE A DAY   Multiple Vitamins-Minerals (CENTRUM PO) Take 1 tablet by mouth every morning.   sertraline (ZOLOFT) 50 MG tablet TAKE 1 TABLET BY MOUTH EVERY DAY   simvastatin (ZOCOR) 40 MG tablet Take 1 tablet (40 mg total) by mouth at bedtime.   Facility-Administered Encounter Medications as of 03/09/2021  Medication   0.9 %  sodium chloride infusion   03/09/2021 Name: John Graham MRN: [id number] DOB: [birth year] John Graham is a 75 y.o. year old male who is a primary care patient of Burchette, Alinda Sierras, MD.  Comprehensive medication review performed; Spoke to patient regarding cholesterol  Lipid Panel    Component Value Date/Time   CHOL 153 02/28/2021 1103   TRIG 137.0 02/28/2021 1103   HDL 35.10 (L) 02/28/2021 1103   LDLCALC 91 02/28/2021 1103   LDLCALC 108 (H) 06/28/2020 1542   LDLDIRECT 89.0 04/06/2015 1202    10-year ASCVD risk score: The ASCVD Risk score Mikey Bussing DC Jr., et al., 2013) failed to calculate for the following reasons:   The systolic blood pressure is missing  Current antihyperlipidemic regimen:  Simvastatin 20 mg 1 tablet every day in the evening Previous antihyperlipidemic medications tried: None ASCVD risk enhancing conditions: age >62  What recent interventions/DTPs have been made by any provider to improve Cholesterol control since last CPP Visit:  Patient reports none  Any recent hospitalizations or ED visits since last  visit with CPP? Patient reports none  What diet changes have been made to improve Cholesterol?  Patient reports he does not eat very much will usually have a healthy choice microwave dinner during the day. What exercise is being done to improve Cholesterol?  Patient reports that he gets out in the yard on cooler days and does the yard work. Gets around in the house pretty well otherwise.  Adherence Review: Does the patient have >5 day gap between last estimated fill dates? No  Care Gaps:  Zoster Vaccine - Overdue COVID Booster #4 Therapist, music) - Overdue CCM F/U Call - Scheduled 06-06-2021 3pm AWV 09/2020 done no future appointment - MSG sent to Ramond Craver CMA to schedule.  Star Rating Drugs:  Simvastatin 40mg  - Last filled 12-29-2020 [facility] at [place] Pharmacist Assistant [phone]

## 2021-04-27 ENCOUNTER — Other Ambulatory Visit: Payer: Self-pay | Admitting: Family Medicine

## 2021-06-03 ENCOUNTER — Telehealth: Payer: Self-pay | Admitting: Pharmacist

## 2021-06-03 NOTE — Chronic Care Management (AMB) (Signed)
    Chronic Care Management Pharmacy Assistant   Name: John Graham  MRN: [id number] DOB: 1945/10/27  06/03/21 APPOINTMENT REMINDER   [name] was reminded to have all medications, supplements and any blood glucose and blood pressure readings available for review with Jeni Salles, Pharm. D, for telephone visit on 10/24 at 3.  Care Gaps: AWV - 2/22 Zoster Vaccine - Overdue COVID Booster #4 - Overdue Flu Vaccine - Overdue BP- 124/70  Star Rating Drug: Simvastatin (Zocor) 40 mg - Last filled 03/27/21 [facility] at [facility]  Any gaps in medications fill history? None   Medications: Outpatient Encounter Medications as of 06/03/2021  Medication Sig   CALCIUM-MAGNESIUM-VITAMIN D PO Take by mouth. With Zinc-Take one pill daily   metoprolol tartrate (LOPRESSOR) 25 MG tablet TAKE 1 TABLET BY MOUTH TWICE A DAY   Multiple Vitamins-Minerals (CENTRUM PO) Take 1 tablet by mouth every morning.   sertraline (ZOLOFT) 50 MG tablet TAKE 1 TABLET BY MOUTH EVERY DAY   simvastatin (ZOCOR) 40 MG tablet Take 1 tablet (40 mg total) by mouth at bedtime.   Facility-Administered Encounter Medications as of 06/03/2021  Medication   0.9 %  sodium chloride infusion    Ned Clines [place] Clinical Pharmacist Assistant [phone]

## 2021-06-06 ENCOUNTER — Ambulatory Visit (INDEPENDENT_AMBULATORY_CARE_PROVIDER_SITE_OTHER): Payer: Medicare Other | Admitting: Pharmacist

## 2021-06-06 DIAGNOSIS — E785 Hyperlipidemia, unspecified: Secondary | ICD-10-CM

## 2021-06-06 DIAGNOSIS — I10 Essential (primary) hypertension: Secondary | ICD-10-CM

## 2021-06-06 NOTE — Patient Instructions (Signed)
Hi John Graham,  It was great to catch up with you again! Below is a summary of some of the topics we discussed.   Please reach out to me if you have any questions or need anything before our follow up!  Best, Maddie  John Graham, PharmD, [place] Pharmacist [place] [facility] [place]     Visit Information   Goals Addressed   None    Patient Care Plan: CCM Pharmacy Care Plan     Problem Identified: Problem: Hypertension, Hyperlipidemia, Anxiety, Osteoarthritis and Tobacco use      Long-Range Goal: Patient-Specific Goal   Start Date: 12/06/2020  Expected End Date: 12/06/2021  Recent Progress: On track  Priority: High  Note:   Current Barriers:  Unable to independently monitor therapeutic efficacy  Pharmacist Clinical Goal(s):  Patient will achieve adherence to monitoring guidelines and medication adherence to achieve therapeutic efficacy through collaboration with PharmD and provider.   Interventions: 1:1 collaboration with [name], MD regarding development and update of comprehensive plan of care as evidenced by provider attestation and co-signature Inter-disciplinary care team collaboration (see longitudinal plan of care) Comprehensive medication review performed; medication list updated in electronic medical record  Hypertension (BP goal <140/90) -Controlled -Current treatment: Metoprolol tartrate 25 mg 1 tablet twice daily -Medications previously tried: n/a  -Current home readings: 122/83 (checking 2-3 times a week) - 2 arm machines (compares the two machines) -Current dietary habits: mostly frozen or easy to fix foods -Current exercise habits: tries to walk for half an hour each day -Denies hypotensive/hypertensive symptoms -Educated on Daily salt intake goal < 2300 mg; Exercise goal of 150 minutes per week; Importance of home blood pressure monitoring; -Counseled to monitor BP at home at least weekly, document, and provide  log at future appointments -Counseled on diet and exercise extensively Recommended to continue current medication  Hyperlipidemia: (LDL goal < 100) -Controlled -Current treatment: Simvastatin 40 mg 1 tablet every day in the evening -Medications previously tried: none  -Current dietary patterns: doesn't eat fried foods, doesn't cook with oil -Current exercise habits: tries to walk for half an hour each day -Educated on Cholesterol goals;  Benefits of statin for ASCVD risk reduction; Exercise goal of 150 minutes per week; -Recommended to continue current medication  Anxiety (Goal: minimize symptoms) -Controlled -Current treatment: Sertraline 50 mg 1 tablet daily -Medications previously tried/failed: none -GAD7: 0 -Educated on Benefits of medication for symptom control -Recommended to continue current medication Counseled on may not be needed long term but patient prefers to keep for now.  Tobacco use (Goal quit smoking) -Uncontrolled -Previous quit attempts: cold [place] -Current treatment  No medications -Patient smokes Within 30 minutes of waking -Patient triggers include:  enjoyment -On a scale of 1-10, reports MOTIVATION to quit is 1 -On a scale of 1-10, reports CONFIDENCE in quitting is 1 -Provided contact information for [place] Quit Line (1-800-QUIT-NOW) and encouraged patient to reach out to this group for support. -Counseled on benefits of quitting smoking or cutting back on cigarettes/day.   Osteoarthritis (Goal: minimize symptoms) -Controlled -Current treatment  No medications -Medications previously tried: ibuprofen  -Recommended to continue as is  Health Maintenance -Vaccine gaps: shingrix, COVID booster, influenza -Current therapy:  Calcium-mag-vitamin D-zinc daily Multivitamin 1 tablet daily -Educated on Cost vs benefit of each product must be carefully weighed by individual consumer -Patient is satisfied with current therapy and denies issues -Recommended  to continue current medication  Patient Goals/Self-Care Activities Patient will:  - take medications as  prescribed check blood pressure at least weekly, document, and provide at future appointments target a minimum of 150 minutes of moderate intensity exercise weekly  Follow Up Plan: Telephone follow up appointment with care management team member scheduled for: 1 year       Patient verbalizes understanding of instructions provided today and agrees to view in [place].  Telephone follow up appointment with pharmacy team member scheduled for: 1 year  Viona Gilmore, [facility]

## 2021-06-06 NOTE — Progress Notes (Signed)
Chronic Care Management Pharmacy Note  06/06/2021 Name:  John Graham MRN:  [id number] DOB:  1946-01-12  Summary: LDL at goal < 100   Recommendations/Changes made from today's visit: -Recommended checking blood pressures once a week at home -Recommended scheduling annual exam with PCP   Plan: BP assessment in 2-3 months  Subjective: John Graham is an 75 y.o. year old male who is a primary patient of Burchette, Alinda Sierras, MD.  The CCM team was consulted for assistance with disease management and care coordination needs.    Engaged with patient by telephone for follow up visit in response to provider referral for pharmacy case management and/or care coordination services.   Consent to Services:  The patient was given information about Chronic Care Management services, agreed to services, and gave verbal consent prior to initiation of services.  Please see initial visit note for detailed documentation.   Patient Care Team: [name], MD as PCP - General Viona Gilmore, [facility] as Pharmacist (Pharmacist)  Recent office visits: 09/14/20 Medicare Wellness Visit  06/28/20 [name], MD: Patient presented for knee pain. Discontinued ibuprofen and turmeric. Recommended Tylenol or topicals.  Recent consult visits: None  Hospital visits: None in previous 6 months  Objective:  Lab Results  Component Value Date   CREATININE 1.07 06/28/2020   BUN 17 06/28/2020   GFR 63.79 06/04/2018   GFRNONAA 84 (L) 12/09/2011   GFRAA >90 12/09/2011   NA 141 06/28/2020   K 4.1 06/28/2020   CALCIUM 9.2 06/28/2020   CO2 28 06/28/2020   GLUCOSE 115 (H) 06/28/2020    Lab Results  Component Value Date/Time   GFR 63.79 06/04/2018 05:19 PM   GFR 79.12 05/14/2017 02:45 PM    Last diabetic Eye exam: No results found for: HMDIABEYEEXA  Last diabetic Foot exam: No results found for: HMDIABFOOTEX   Lab Results  Component Value Date   CHOL 153 02/28/2021   HDL 35.10 (L)  02/28/2021   LDLCALC 91 02/28/2021   LDLDIRECT 89.0 04/06/2015   TRIG 137.0 02/28/2021   CHOLHDL 4 02/28/2021    Hepatic Function Latest Ref Rng & Units 02/28/2021 06/28/2020 06/04/2018  Total Protein 6.0 - 8.3 g/dL 6.8 7.0 7.4  Albumin 3.5 - 5.2 g/dL 4.2 - 4.2  AST 0 - 37 U/L '16 16 16  ' ALT 0 - 53 U/L '11 11 13  ' Alk Phosphatase 39 - 117 U/L 82 - 80  Total Bilirubin 0.2 - 1.2 mg/dL 0.6 0.7 0.6  Bilirubin, Direct 0.0 - 0.3 mg/dL 0.1 0.2 0.1    Lab Results  Component Value Date/Time   TSH 0.63 05/14/2017 02:45 PM   TSH 0.52 04/06/2015 12:02 PM    CBC Latest Ref Rng & Units 05/14/2017 04/06/2015 05/08/2012  WBC 4.0 - 10.5 K/uL 7.4 6.9 7.2  Hemoglobin 13.0 - 17.0 g/dL 14.9 14.3 13.5  Hematocrit 39.0 - 52.0 % 43.8 41.7 40.5  Platelets 150.0 - 400.0 K/uL 189.0 173.0 172.0    No results found for: VD25OH  Clinical ASCVD: No  The ASCVD Risk score (Arnett DK, et al., 2019) failed to calculate for the following reasons:   The systolic blood pressure is missing    Depression screen [facility] 2/9 09/14/2020 06/04/2018 05/14/2017  Decreased Interest 0 0 0  Down, Depressed, Hopeless 0 0 0  PHQ - 2 Score 0 0 0  Altered sleeping 0 - -  Tired, decreased energy 0 - -  Change in appetite 0 - -  Feeling bad or failure about yourself  0 - -  Trouble concentrating 0 - -  Moving slowly or fidgety/restless 0 - -  Suicidal thoughts 0 - -  PHQ-9 Score 0 - -  Difficult doing work/chores Not difficult at all - -     Social History   Tobacco Use  Smoking Status Every Day   Packs/day: 1.00   Years: 53.00   Pack years: 53.00   Types: Cigarettes  Smokeless Tobacco Never   BP Readings from Last 3 Encounters:  06/28/20 124/70  06/04/18 120/78  09/13/17 109/68   Pulse Readings from Last 3 Encounters:  06/28/20 73  06/04/18 61  09/13/17 63   Wt Readings from Last 3 Encounters:  06/28/20 222 lb (100.7 kg)  06/04/18 208 lb 12.8 oz (94.7 kg)  09/13/17 209 lb (94.8 kg)   BMI Readings from Last 3  Encounters:  06/28/20 30.96 kg/m  06/04/18 29.12 kg/m  09/13/17 29.15 kg/m    Assessment/Interventions: Review of patient past medical history, allergies, medications, health status, including review of consultants reports, laboratory and other test data, was performed as part of comprehensive evaluation and provision of chronic care management services.   SDOH:  (Social Determinants of Health) assessments and interventions performed: No   SDOH Screenings   Alcohol Screen: Low Risk    Last Alcohol Screening Score (AUDIT): 0  Depression (PHQ2-9): Low Risk    PHQ-2 Score: 0  Financial Resource Strain: Low Risk    Difficulty of Paying Living Expenses: Not hard at all  Food Insecurity: No Food Insecurity   Worried About Charity fundraiser in the Last Year: Never true   Ran Out of Food in the Last Year: Never true  Housing: Low Risk    Last Housing Risk Score: 0  Physical Activity: Inactive   Days of Exercise per Week: 0 days   Minutes of Exercise per Session: 0 min  Social Connections: Socially Isolated   Frequency of Communication with Friends and Family: Once a week   Frequency of Social Gatherings with Friends and Family: Never   Attends Religious Services: Never   Marine scientist or Organizations: No   Attends Music therapist: Never   Marital Status: Married  Stress: No Stress Concern Present   Feeling of Stress : Not at all  Tobacco Use: High Risk   Smoking Tobacco Use: Every Day   Smokeless Tobacco Use: Never   Passive Exposure: Not on file  Transportation Needs: No Transportation Needs   Lack of Transportation (Medical): No   Lack of Transportation (Non-Medical): No    CCM Care Plan  No Known Allergies  Medications Reviewed Today     Reviewed by Viona Gilmore, [facility] (Pharmacist) on 12/06/20 at 1428  Med List Status: <None>   Medication Order Taking? Sig Documenting Provider Last Dose Status Informant  0.9 %  sodium chloride infusion  [id number]   Irene Shipper, MD  Active   CALCIUM-MAGNESIUM-VITAMIN D PO [id number] Yes Take by mouth. With Zinc-Take one pill daily [provider] Taking Active   metoprolol tartrate (LOPRESSOR) 25 MG tablet [id number] Yes TAKE 1 TABLET BY MOUTH TWICE A DAY Burchette, Alinda Sierras, MD Taking Active   Multiple Vitamins-Minerals (CENTRUM PO) [id number] Yes Take 1 tablet by mouth every morning. [provider] Taking Active Multiple Informants  sertraline (ZOLOFT) 50 MG tablet [id number] Yes TAKE 1 TABLET BY MOUTH EVERY DAY Burchette, Alinda Sierras, MD Taking Active   simvastatin (ZOCOR)  20 MG tablet [id number] Yes TAKE 1 TABLET BY MOUTH EVERY DAY IN THE Rutha Bouchard, Alinda Sierras, MD Taking Active             Patient Active Problem List   Diagnosis Date Noted   Localized primary osteoarthritis of carpometacarpal joint of left thumb 01/25/2017   Psoriasiform eruption 02/03/2014   Anxiety state 07/29/2013   SKIN LESION 12/30/2009   Bilateral primary osteoarthritis of knee 08/23/2009   Hyperlipidemia 03/22/2009   Essential hypertension 03/22/2009   COLONIC POLYPS, HX OF 03/22/2009    Immunization History  Administered Date(s) Administered   Fluad Quad(high Dose 65+) 06/28/2020   Influenza Split 05/22/2011, 05/24/2012   Influenza, High Dose Seasonal PF 06/06/2013, 04/04/2017, 06/04/2018, 04/29/2019   Influenza,inj,Quad PF,6+ Mos 04/24/2014   Influenza-Unspecified 05/26/2019   PFIZER(Purple Top)SARS-COV-2 Vaccination 10/09/2019, 11/07/2019, 07/21/2020   Pneumococcal Conjugate-13 01/04/2012, 04/06/2015   Pneumococcal Polysaccharide-23 05/14/2017   Tdap 01/04/2012   Zoster, Live 06/24/2012   Patient did not make any changes with diet in the past few months.  Conditions to be addressed/monitored:  Hypertension, Hyperlipidemia, Anxiety, Osteoarthritis and Tobacco use  Conditions addressed this visit: Hyperlipidemia, hypertension, tobacco use  Care Plan : CCM Pharmacy Care Plan   Updates made by Viona Gilmore, [place] since 06/06/2021 12:00 AM     Problem: Problem: Hypertension, Hyperlipidemia, Anxiety, Osteoarthritis and Tobacco use      Long-Range Goal: Patient-Specific Goal   Start Date: 12/06/2020  Expected End Date: 12/06/2021  Recent Progress: On track  Priority: High  Note:   Current Barriers:  Unable to independently monitor therapeutic efficacy  Pharmacist Clinical Goal(s):  Patient will achieve adherence to monitoring guidelines and medication adherence to achieve therapeutic efficacy through collaboration with PharmD and provider.   Interventions: 1:1 collaboration with [name], MD regarding development and update of comprehensive plan of care as evidenced by provider attestation and co-signature Inter-disciplinary care team collaboration (see longitudinal plan of care) Comprehensive medication review performed; medication list updated in electronic medical record  Hypertension (BP goal <140/90) -Controlled -Current treatment: Metoprolol tartrate 25 mg 1 tablet twice daily -Medications previously tried: n/a  -Current home readings: 122/83 (checking 2-3 times a week) - 2 arm machines (compares the two machines) -Current dietary habits: mostly frozen or easy to fix foods -Current exercise habits: tries to walk for half an hour each day -Denies hypotensive/hypertensive symptoms -Educated on Daily salt intake goal < 2300 mg; Exercise goal of 150 minutes per week; Importance of home blood pressure monitoring; -Counseled to monitor BP at home at least weekly, document, and provide log at future appointments -Counseled on diet and exercise extensively Recommended to continue current medication  Hyperlipidemia: (LDL goal < 100) -Controlled -Current treatment: Simvastatin 40 mg 1 tablet every day in the evening -Medications previously tried: none  -Current dietary patterns: doesn't eat fried foods, doesn't cook with oil -Current  exercise habits: tries to walk for half an hour each day -Educated on Cholesterol goals;  Benefits of statin for ASCVD risk reduction; Exercise goal of 150 minutes per week; -Recommended to continue current medication  Anxiety (Goal: minimize symptoms) -Controlled -Current treatment: Sertraline 50 mg 1 tablet daily -Medications previously tried/failed: none -GAD7: 0 -Educated on Benefits of medication for symptom control -Recommended to continue current medication Counseled on may not be needed long term but patient prefers to keep for now.  Tobacco use (Goal quit smoking) -Uncontrolled -Previous quit attempts: cold [place] -Current treatment  No medications -Patient smokes Within 30 minutes  of waking -Patient triggers include:  enjoyment -On a scale of 1-10, reports MOTIVATION to quit is 1 -On a scale of 1-10, reports CONFIDENCE in quitting is 1 -Provided contact information for [place] Quit Line (1-800-QUIT-NOW) and encouraged patient to reach out to this group for support. -Counseled on benefits of quitting smoking or cutting back on cigarettes/day.   Osteoarthritis (Goal: minimize symptoms) -Controlled -Current treatment  No medications -Medications previously tried: ibuprofen  -Recommended to continue as is  Health Maintenance -Vaccine gaps: shingrix, COVID booster, influenza -Current therapy:  Calcium-mag-vitamin D-zinc daily Multivitamin 1 tablet daily -Educated on Cost vs benefit of each product must be carefully weighed by individual consumer -Patient is satisfied with current therapy and denies issues -Recommended to continue current medication  Patient Goals/Self-Care Activities Patient will:  - take medications as prescribed check blood pressure at least weekly, document, and provide at future appointments target a minimum of 150 minutes of moderate intensity exercise weekly  Follow Up Plan: Telephone follow up appointment with care management team member  scheduled for: 1 year        Medication Assistance: None required.  Patient affirms current coverage meets needs.  Compliance/Adherence/Medication fill history: Care Gaps: Shingrix, COVID booster, influenza Last BP- 124/70   Star-Rating Drugs: Simvastatin (Zocor) 40 mg - [facility]  Patient's preferred pharmacy is:  [id number] [place], [place] - [address] [place] [address]Phone: [phone]: [phone] Uses pill box? Yes - AM/PM (fills it on Saturdays) Pt endorses 100% compliance  We discussed: Current pharmacy is preferred with insurance plan and patient is satisfied with pharmacy services Patient decided to: Continue current medication management strategy  Care Plan and Follow Up Patient Decision:  Patient agrees to Care Plan and Follow-up.  Plan: Telephone follow up appointment with care management team member scheduled for:  1 year  MJeni Salles PharmD BAlmaPharmacist LOccidental Petroleumat [phone]

## 2021-06-13 DIAGNOSIS — E785 Hyperlipidemia, unspecified: Secondary | ICD-10-CM | POA: Diagnosis not present

## 2021-06-13 DIAGNOSIS — I10 Essential (primary) hypertension: Secondary | ICD-10-CM

## 2021-07-19 ENCOUNTER — Other Ambulatory Visit: Payer: Self-pay | Admitting: Family Medicine

## 2021-08-24 ENCOUNTER — Telehealth: Payer: Self-pay | Admitting: Pharmacist

## 2021-08-24 NOTE — Progress Notes (Signed)
° ° °  Chronic Care Management Pharmacy Assistant   Name: John Graham  MRN: [id number] DOB: May 28, 1946  Reason for Encounter: Disease State / Hypertension Assessment   Conditions to be addressed/monitored: HTN  Recent office visits:  None  Recent consult visits:  None  Hospital visits:  None in previous 6 months  Medications: Outpatient Encounter Medications as of 08/24/2021  Medication Sig   CALCIUM-MAGNESIUM-VITAMIN D PO Take by mouth. With Zinc-Take one pill daily   metoprolol tartrate (LOPRESSOR) 25 MG tablet TAKE 1 TABLET BY MOUTH TWICE A DAY   Multiple Vitamins-Minerals (CENTRUM PO) Take 1 tablet by mouth every morning.   sertraline (ZOLOFT) 50 MG tablet TAKE 1 TABLET BY MOUTH EVERY DAY   simvastatin (ZOCOR) 40 MG tablet Take 1 tablet (40 mg total) by mouth at bedtime.   Facility-Administered Encounter Medications as of 08/24/2021  Medication   0.9 %  sodium chloride infusion  Reviewed chart prior to disease state call. Spoke with patient regarding BP  Recent Office Vitals: BP Readings from Last 3 Encounters:  06/28/20 124/70  06/04/18 120/78  09/13/17 109/68   Pulse Readings from Last 3 Encounters:  06/28/20 73  06/04/18 61  09/13/17 63    Wt Readings from Last 3 Encounters:  06/28/20 222 lb (100.7 kg)  06/04/18 208 lb 12.8 oz (94.7 kg)  09/13/17 209 lb (94.8 kg)     Kidney Function Lab Results  Component Value Date/Time   CREATININE 1.07 06/28/2020 03:42 PM   CREATININE 1.19 06/04/2018 05:19 PM   CREATININE 0.99 05/14/2017 02:45 PM   GFR 63.79 06/04/2018 05:19 PM   GFRNONAA 84 (L) 12/09/2011 06:01 PM   GFRAA >90 12/09/2011 06:01 PM    BMP Latest Ref Rng & Units 06/28/2020 06/04/2018 05/14/2017  Glucose 65 - 99 mg/dL 115(H) 94 103(H)  BUN 7 - 25 mg/dL 17 18 17   Creatinine 0.70 - 1.18 mg/dL 1.07 1.19 0.99  BUN/Creat Ratio 6 - 22 (calc) NOT APPLICABLE - -  Sodium 888 - 146 mmol/L 141 138 141  Potassium 3.5 - 5.3 mmol/L 4.1 4.5 4.3  Chloride 98 -  110 mmol/L 104 103 103  CO2 20 - 32 mmol/L 28 30 30   Calcium 8.6 - 10.3 mg/dL 9.2 9.5 9.8    Current antihypertensive regimen:  Metoprolol tartrate 25 mg 1 tablet twice daily Unable to reach for assessment     Care Gaps: AWV - 2/22 Zoster Vaccine - Overdue COVID Booster #4 - Overdue Flu Vaccine - Overdue BP- 124/70 CCM- 06/06/22 Star Rating Drugs: Simvastatin (Zocor) 40 mg - Last filled 06/25/21 90 DS at [place] Pharmacist Assistant [phone]

## 2021-10-20 ENCOUNTER — Other Ambulatory Visit: Payer: Self-pay | Admitting: Family Medicine

## 2021-10-23 ENCOUNTER — Other Ambulatory Visit: Payer: Self-pay | Admitting: Family Medicine

## 2021-12-14 ENCOUNTER — Telehealth: Payer: Self-pay | Admitting: Pharmacist

## 2021-12-14 NOTE — Chronic Care Management (AMB) (Signed)
Chronic Care Management Pharmacy Assistant   Name: John Graham  MRN: [id number] DOB: November 26, 1945  Reason for Encounter: Disease State   Conditions to be addressed/monitored: HTN  Recent office visits:  None  Recent consult visits:  None  Hospital visits:  None in previous 6 months  Medications: Outpatient Encounter Medications as of 12/14/2021  Medication Sig   CALCIUM-MAGNESIUM-VITAMIN D PO Take by mouth. With Zinc-Take one pill daily   metoprolol tartrate (LOPRESSOR) 25 MG tablet Take 1 tablet (25 mg total) by mouth 2 (two) times daily. *appointment required for future refills*   Multiple Vitamins-Minerals (CENTRUM PO) Take 1 tablet by mouth every morning.   sertraline (ZOLOFT) 50 MG tablet TAKE 1 TABLET BY MOUTH EVERY DAY   simvastatin (ZOCOR) 40 MG tablet Take 1 tablet (40 mg total) by mouth at bedtime.   Facility-Administered Encounter Medications as of 12/14/2021  Medication   0.9 %  sodium chloride infusion  Reviewed chart prior to disease state call. Spoke with patient regarding BP  Recent Office Vitals: BP Readings from Last 3 Encounters:  06/28/20 124/70  06/04/18 120/78  09/13/17 109/68   Pulse Readings from Last 3 Encounters:  06/28/20 73  06/04/18 61  09/13/17 63    Wt Readings from Last 3 Encounters:  06/28/20 222 lb (100.7 kg)  06/04/18 208 lb 12.8 oz (94.7 kg)  09/13/17 209 lb (94.8 kg)     Kidney Function Lab Results  Component Value Date/Time   CREATININE 1.07 06/28/2020 03:42 PM   CREATININE 1.19 06/04/2018 05:19 PM   CREATININE 0.99 05/14/2017 02:45 PM   GFR 63.79 06/04/2018 05:19 PM   GFRNONAA 84 (L) 12/09/2011 06:01 PM   GFRAA >90 12/09/2011 06:01 PM       Latest Ref Rng & Units 06/28/2020    3:42 PM 06/04/2018    5:19 PM 05/14/2017    2:45 PM  BMP  Glucose 65 - 99 mg/dL 115   94   103    BUN 7 - 25 mg/dL 17   18   17     Creatinine 0.70 - 1.18 mg/dL 1.07   1.19   0.99    BUN/Creat Ratio 6 - 22 (calc) NOT APPLICABLE       Sodium 135 - 146 mmol/L 141   138   141    Potassium 3.5 - 5.3 mmol/L 4.1   4.5   4.3    Chloride 98 - 110 mmol/L 104   103   103    CO2 20 - 32 mmol/L 28   30   30     Calcium 8.6 - 10.3 mg/dL 9.2   9.5   9.8      Current antihypertensive regimen:  Metoprolol tartrate 25 mg 1 tablet twice daily How often are you checking your Blood Pressure? weekly Current home BP readings: 139/76 p 78 Patient denies any hyper/hypotensive symptoms. He reports his blood pressures when he has been checking have remained around the above reading. What recent interventions/DTPs have been made by any provider to improve Blood Pressure control since last CPP Visit: Patient reports none Any recent hospitalizations or ED visits since last visit with CPP? No   Adherence Review: Is the patient currently on ACE/ARB medication? No Does the patient have >5 day gap between last estimated fill dates? No  Care Gaps: Zoster Vaccine - Overdue Colonoscopy - Overdue AWV- 2/22 MSG sent to Ramond Craver CMA to schedule. CCM- 10/23 BP- 139/76 ( 12/21/21)  Star Rating  Drugs: Simvastatin 40 mg - Last filled 10/03/21 90 DS at [place] Pharmacist Assistant [phone]

## 2021-12-27 ENCOUNTER — Ambulatory Visit (INDEPENDENT_AMBULATORY_CARE_PROVIDER_SITE_OTHER): Payer: Medicare Other | Admitting: Family Medicine

## 2021-12-27 ENCOUNTER — Encounter: Payer: Self-pay | Admitting: Family Medicine

## 2021-12-27 DIAGNOSIS — D126 Benign neoplasm of colon, unspecified: Secondary | ICD-10-CM | POA: Diagnosis not present

## 2021-12-27 DIAGNOSIS — H9193 Unspecified hearing loss, bilateral: Secondary | ICD-10-CM | POA: Diagnosis not present

## 2021-12-27 DIAGNOSIS — Z0000 Encounter for general adult medical examination without abnormal findings: Secondary | ICD-10-CM | POA: Diagnosis not present

## 2021-12-27 DIAGNOSIS — R251 Tremor, unspecified: Secondary | ICD-10-CM | POA: Diagnosis not present

## 2021-12-27 LAB — BASIC METABOLIC PANEL
BUN: 13 mg/dL (ref 6–23)
CO2: 30 mEq/L (ref 19–32)
Calcium: 9.7 mg/dL (ref 8.4–10.5)
Chloride: 104 mEq/L (ref 96–112)
Creatinine, Ser: 1.18 mg/dL (ref 0.40–1.50)
GFR: 60.16 mL/min (ref >60.00)
Glucose, Bld: 111 mg/dL — ABNORMAL HIGH (ref 70–99)
Potassium: 4.6 mEq/L (ref 3.5–5.1)
Sodium: 141 mEq/L (ref 135–145)

## 2021-12-27 LAB — CBC WITH DIFFERENTIAL/PLATELET
Basophils Absolute: 0 10*3/uL (ref 0.0–0.1)
Basophils Relative: 0.4 % (ref 0.0–3.0)
Eosinophils Absolute: 0.2 10*3/uL (ref 0.0–0.7)
Eosinophils Relative: 2.1 % (ref 0.0–5.0)
HCT: 44.8 % (ref 39.0–52.0)
Hemoglobin: 15.4 g/dL (ref 13.0–17.0)
Lymphocytes Relative: 18.3 % (ref 12.0–46.0)
Lymphs Abs: 1.6 10*3/uL (ref 0.7–4.0)
MCHC: 34.5 g/dL (ref 30.0–36.0)
MCV: 98.4 fl (ref 78.0–100.0)
Monocytes Absolute: 0.7 10*3/uL (ref 0.1–1.0)
Monocytes Relative: 7.7 % (ref 3.0–12.0)
Neutro Abs: 6.3 10*3/uL (ref 1.4–7.7)
Neutrophils Relative %: 71.5 % (ref 43.0–77.0)
Platelets: 166 10*3/uL (ref 150.0–400.0)
RBC: 4.55 Mil/uL (ref 4.22–5.81)
RDW: 12.9 % (ref 11.5–15.5)
WBC: 8.9 10*3/uL (ref 4.0–10.5)

## 2021-12-27 LAB — LIPID PANEL
Cholesterol: 154 mg/dL (ref 0–200)
HDL: 39.3 mg/dL (ref >39.00)
LDL Cholesterol: 88 mg/dL (ref 0–99)
NonHDL: 114.89
Total CHOL/HDL Ratio: 4
Triglycerides: 132 mg/dL (ref 0.0–149.0)
VLDL: 26.4 mg/dL (ref 0.0–40.0)

## 2021-12-27 LAB — HEPATIC FUNCTION PANEL
ALT: 14 U/L (ref 0–53)
AST: 18 U/L (ref 0–37)
Albumin: 4.3 g/dL (ref 3.5–5.2)
Alkaline Phosphatase: 81 U/L (ref 39–117)
Bilirubin, Direct: 0.1 mg/dL (ref 0.0–0.3)
Total Bilirubin: 0.7 mg/dL (ref 0.2–1.2)
Total Protein: 7.4 g/dL (ref 6.0–8.3)

## 2021-12-27 LAB — TSH: TSH: 0.84 u[IU]/mL (ref 0.35–5.50)

## 2021-12-27 NOTE — Patient Instructions (Signed)
Try to quit smoking! ? ?Consider trial of wax softener for ears- Cerumenex or Debrox.   ? ? ?

## 2021-12-27 NOTE — Progress Notes (Signed)
? ?Established Patient Office Visit ? ?Subjective   ?Patient ID: John Graham, male    DOB: [birth year]  Age: 76 y.o. MRN: [id number] ? ?Chief Complaint  ?Patient presents with  ? Annual Exam  ? ? ?HPI ? ? ?Patient seen today for physical exam.  He is very hard of hearing which makes communication difficult.  He is concerned about possible cerumen impaction.  He apparently has had some hearing difficulties for some time.  He also has upper extremity tremor right greater than left.  He states this started a couple years ago.  Is not aware of any family history of tremor. ? ?Longstanding history of nicotine use.  Low motivation to quit.   He also has history of hypertension, hyperlipidemia, osteoarthritis.  He retired from Investment banker, corporate about 5 years ago.  He states he spends much of his time now sitting on the porch.  Denies any recent falls. ? ?Health maintenance reviewed ? ?-Has had Zostavax but no Shingrix vaccine. ?-Was due for repeat colonoscopy 1/22 secondary to history of adenomatous polyps. ?-Tetanus due this year ?-Pneumonia vaccines complete. ? ?Social history-he is married.  He has 1 child and 2 grandchildren.  Ongoing nicotine use about 1 pack cigarette per day.  No alcohol in several years.  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 ? ?Family history reviewed and unchanged ? ?Past Medical History:  ?Diagnosis Date  ? Anxiety   ? PTSD  ? Hypercholesteremia   ? Hyperlipidemia   ? Hypertension   ? ?Past Surgical History:  ?Procedure Laterality Date  ? COLONOSCOPY    ? DENTAL SURGERY    ? 8 dental implants  ? POLYPECTOMY    ? ? reports that he has been smoking cigarettes. He has a 53.00 pack-year smoking history. He has never used smokeless tobacco. He reports that he does not drink alcohol and does not use drugs. ?family history includes Cancer in his father; Pancreatic cancer in his mother. ?No Known Allergies ? ?Review of Systems  ?Constitutional:  Negative for chills, fever, malaise/fatigue  and weight loss.  ?HENT:  Positive for hearing loss. Negative for congestion, ear discharge and ear pain.   ?Eyes:  Negative for blurred vision and double vision.  ?Respiratory:  Negative for cough and shortness of breath.   ?Cardiovascular:  Negative for chest pain, palpitations and leg swelling.  ?Gastrointestinal:  Negative for abdominal pain, blood in stool, constipation and diarrhea.  ?Genitourinary:  Negative for dysuria.  ?Skin:  Negative for rash.  ?Neurological:  Positive for tremors. Negative for dizziness, speech change, focal weakness, seizures, loss of consciousness and headaches.  ?Psychiatric/Behavioral:  Negative for depression.   ? ?  ?Objective:  ?  ? ?There were no vitals taken for this visit. ?BP Readings from Last 3 Encounters:  ?06/28/20 124/70  ?06/04/18 120/78  ?09/13/17 109/68  ? ?Wt Readings from Last 3 Encounters:  ?06/28/20 222 lb (100.7 kg)  ?06/04/18 208 lb 12.8 oz (94.7 kg)  ?09/13/17 209 lb (94.8 kg)  ? ?  ? ?Physical Exam ?Vitals reviewed.  ?HENT:  ?   Ears:  ?   Comments: Cerumen impactions bilaterally ?Cardiovascular:  ?   Rate and Rhythm: Normal rate and regular rhythm.  ?Pulmonary:  ?   Effort: Pulmonary effort is normal.  ?   Breath sounds: Normal breath sounds.  ?Abdominal:  ?   Palpations: Abdomen is soft.  ?Musculoskeletal:  ?   Cervical back: Neck supple.  ?   Right lower leg: No  edema.  ?   Left lower leg: No edema.  ?Neurological:  ?   General: No focal deficit present.  ?   Mental Status: He is alert.  ?   Cranial Nerves: No cranial nerve deficit.  ?   Comments: He does have "pill-rolling" type tremor upper extremities right greater than left.  No cogwheel rigidity.  ? ? ? ?No results found for any visits on 12/27/21. ? ? ? ?The ASCVD Risk score (Arnett DK, et al., 2019) failed to calculate for the following reasons: ?  The systolic blood pressure is missing ? ?  ?Assessment & Plan:  ? ?Problem List Items Addressed This Visit   ?None ?Visit Diagnoses   ? ? Physical exam     -  Primary  ? Relevant Orders  ? Basic metabolic panel  ? Lipid panel  ? CBC with Differential/Platelet  ? TSH  ? Hepatic function panel  ? Adenoma of colon      ? Relevant Orders  ? Ambulatory referral to Gastroenterology  ? ?  ?-Patient has cerumen impactions bilaterally.  Discussed risk and benefits of irrigation including low risk of perforation, pain, bleeding.  Patient consented.  Both ears irrigated per nurse with removal of some cerumen- but not completely cleared out.   He will try some Cerumenex and return in a few days to repeat irrigation.  ? ?-Set up referral for repeat colonoscopy.  History of adenomatous colon polyps. ? ?-Obtain screening labs as above. ? ?-We discussed possible neurology referral regarding his tremor upper extremities. ? ?-discussed smoking cessation but his motivation is low.   ? ?No follow-ups on file.  ? ? ?Carolann Littler, MD ? ?

## 2021-12-28 ENCOUNTER — Encounter: Payer: Self-pay | Admitting: Neurology

## 2021-12-29 ENCOUNTER — Encounter: Payer: Self-pay | Admitting: Neurology

## 2021-12-30 ENCOUNTER — Ambulatory Visit (INDEPENDENT_AMBULATORY_CARE_PROVIDER_SITE_OTHER): Payer: Medicare Other | Admitting: Family Medicine

## 2021-12-30 ENCOUNTER — Encounter: Payer: Self-pay | Admitting: Family Medicine

## 2021-12-30 VITALS — BP 132/80 | HR 67 | Temp 97.9°F | Ht 71.0 in | Wt 211.5 lb

## 2021-12-30 DIAGNOSIS — H6123 Impacted cerumen, bilateral: Secondary | ICD-10-CM | POA: Diagnosis not present

## 2021-12-30 DIAGNOSIS — R7303 Prediabetes: Secondary | ICD-10-CM

## 2021-12-30 NOTE — Progress Notes (Signed)
   Established Patient Office Visit  Subjective   Patient ID: John Graham, male    DOB: Oct 10, 1945  Age: 76 y.o. MRN: [id number]  Chief Complaint  Patient presents with   Follow-up    F/u from last Friday.  Ear irrigation on both ears.    HPI   Here for recent physical.  He has cerumen impaction both canals.  Difficulty clearing these and he did get some over-the-counter wax softener Debrox and is here for repeat irrigation today.  Labs were reviewed.  Significant for glucose 111.  No polyuria or polydipsia.  He has had prediabetes range glucose in the past.  He states he eats lots of sweets daily.  Very sedentary.  Past Medical History:  Diagnosis Date   Anxiety    PTSD   Hypercholesteremia    Hyperlipidemia    Hypertension    Past Surgical History:  Procedure Laterality Date   COLONOSCOPY     DENTAL SURGERY     8 dental implants   POLYPECTOMY      reports that he has been smoking cigarettes. He has a 53.00 pack-year smoking history. He has never used smokeless tobacco. He reports that he does not drink alcohol and does not use drugs. family history includes Cancer in his father; Pancreatic cancer in his mother. No Known Allergies  Review of Systems  Constitutional:  Negative for chills.  HENT:  Positive for hearing loss. Negative for ear discharge and ear pain.   Cardiovascular:  Negative for chest pain.  Endo/Heme/Allergies:  Negative for polydipsia.     Objective:     BP 132/80 (BP Location: Left Arm, Patient Position: Sitting, Cuff Size: Normal)   Pulse 67   Temp 97.9 F (36.6 C) (Oral)   Ht 5\' 11"  (1.803 m)   Wt 211 lb 8 oz (95.9 kg)   SpO2 95%   BMI 29.50 kg/m    Physical Exam Vitals reviewed.  HENT:     Ears:     Comments: Right canal is clear of cerumen.  He still has some mild residual cerumen left canal Cardiovascular:     Rate and Rhythm: Normal rate and regular rhythm.  Musculoskeletal:     Cervical back: Neck supple.  Neurological:      Mental Status: He is alert.     No results found for any visits on 12/30/21.    The 10-year ASCVD risk score (Arnett DK, et al., 2019) is: 31.8%    Assessment & Plan:   #1 cerumen impaction bilaterally.  Discussed risk of irrigation including risk of pain, low risk of eardrum perforation, and bleeding.  Patient consented.  Both ear canals were irrigated with removal of cerumen bilaterally.  Minimal residual cerumen left canal.  Patient states he can hear better afterwards  #2 prediabetes range blood sugars.  We discussed prevention.  Try to scale back sugars and starches.  No follow-ups on file.    Carolann Littler, MD

## 2022-01-08 ENCOUNTER — Other Ambulatory Visit: Payer: Self-pay | Admitting: Family Medicine

## 2022-01-16 NOTE — Progress Notes (Deleted)
Assessment/Plan:   ***  Subjective:   John Graham was seen today in the movement disorders clinic for neurologic consultation at the request of [name], MD.  The consultation is for the evaluation of "pill rolling tremor", R>L.  Outside records that were made available to me were reviewed.  Tremor: {yes no:314532}   How long has it been going on? ***  At rest or with activation?  ***  When is it noted the most?  ***  Fam hx of tremor?  {yes ZT:245809}  Located where?  ***  Affected by caffeine:  {yes no:314532}  Affected by alcohol:  {yes no:314532}  Affected by stress:  {yes no:314532}  Affected by fatigue:  {yes no:314532}  Spills soup if on spoon:  {yes no:314532}  Spills glass of liquid if full:  {yes no:314532}  Affects ADL's (tying shoes, brushing teeth, etc):  {yes no:314532}  Tremor inducing meds:  {yes no:314532}  Other Specific Symptoms:  Voice: *** Sleep: ***  Vivid Dreams:  {yes no:314532}  Acting out dreams:  {yes no:314532} Wet Pillows: {yes no:314532} Postural symptoms:  {yes no:314532}  Falls?  {yes no:314532} Bradykinesia symptoms: {parkinson brady:18041} Loss of smell:  {yes no:314532} Loss of taste:  {yes no:314532} Urinary Incontinence:  {yes no:314532} Difficulty Swallowing:  {yes no:314532} Handwriting, micrographia: {yes no:314532} Trouble with ADL's:  {yes no:314532}  Trouble buttoning clothing: {yes no:314532} Depression:  {yes no:314532} Memory changes:  {yes no:314532} Hallucinations:  {yes no:314532}  visual distortions: {yes no:314532} N/V:  {yes no:314532} Lightheaded:  {yes no:314532}  Syncope: {yes no:314532} Diplopia:  {yes no:314532} Dyskinesia:  {yes no:314532}  Neuroimaging of the brain has not previously been performed.   PREVIOUS MEDICATIONS: none to date  ALLERGIES:  No Known Allergies  CURRENT MEDICATIONS:  Current Outpatient Medications  Medication Instructions   CALCIUM-MAGNESIUM-VITAMIN D PO Oral,  With Zinc-Take one pill daily    metoprolol tartrate (LOPRESSOR) 25 mg, Oral, 2 times daily, *appointment required for future refills*   Multiple Vitamins-Minerals (CENTRUM PO) 1 tablet, Every morning   sertraline (ZOLOFT) 50 MG tablet TAKE 1 TABLET BY MOUTH EVERY DAY   simvastatin (ZOCOR) 40 MG tablet TAKE 1 TABLET BY MOUTH EVERYDAY AT BEDTIME    Objective:   PHYSICAL EXAMINATION:    VITALS:  There were no vitals filed for this visit.  GEN:  The patient appears stated age and is in NAD. HEENT:  Normocephalic, atraumatic.  The mucous membranes are moist. The superficial temporal arteries are without ropiness or tenderness. CV:  RRR Lungs:  CTAB Neck/HEME:  There are no carotid bruits bilaterally.  Neurological examination:  Orientation: The patient is alert and oriented x3.  Cranial nerves: There is good facial symmetry.  Extraocular muscles are intact. The visual fields are full to confrontational testing. The speech is fluent and clear. Soft palate rises symmetrically and there is no tongue deviation. Hearing is intact to conversational tone. Sensation: Sensation is intact to light touch throughout (facial, trunk, extremities). Vibration is intact at the bilateral big toe. There is no extinction with double simultaneous stimulation.  Motor: Strength is 5/5 in the bilateral upper and lower extremities.   Shoulder shrug is equal and symmetric.  There is no pronator drift. Deep tendon reflexes: Deep tendon reflexes are 2/4 at the bilateral biceps, triceps, brachioradialis, patella and achilles. Plantar responses are downgoing bilaterally.  Movement examination: Tone: There is ***tone in the bilateral upper extremities.  The tone in the lower extremities is ***.  Abnormal movements: ***  Coordination:  There is *** decremation with RAM's, *** Gait and Station: The patient has *** difficulty arising out of a deep-seated chair without the use of the hands. The patient's stride length is  ***.  The patient has a *** pull test.     I have reviewed and interpreted the following labs independently   Chemistry      Component Value Date/Time   NA 141 12/27/2021 1444   K 4.6 12/27/2021 1444   CL 104 12/27/2021 1444   CO2 30 12/27/2021 1444   BUN 13 12/27/2021 1444   CREATININE 1.18 12/27/2021 1444   CREATININE 1.07 06/28/2020 1542      Component Value Date/Time   CALCIUM 9.7 12/27/2021 1444   ALKPHOS 81 12/27/2021 1444   AST 18 12/27/2021 1444   ALT 14 12/27/2021 1444   BILITOT 0.7 12/27/2021 1444      Lab Results  Component Value Date   TSH 0.84 12/27/2021   Lab Results  Component Value Date   WBC 8.9 12/27/2021   HGB 15.4 12/27/2021   HCT 44.8 12/27/2021   MCV 98.4 12/27/2021   PLT 166.0 12/27/2021      Total time spent on today's visit was ***greater than 60 minutes, including both face-to-face time and nonface-to-face time.  Time included that spent on review of records (prior notes available to me/labs/imaging if pertinent), discussing treatment and goals, answering patient's questions and coordinating care.  Cc:  [name], MD

## 2022-01-17 ENCOUNTER — Ambulatory Visit: Payer: Medicare Other | Admitting: Neurology

## 2022-01-17 ENCOUNTER — Telehealth: Payer: Self-pay | Admitting: Family Medicine

## 2022-01-17 ENCOUNTER — Encounter: Payer: Self-pay | Admitting: Neurology

## 2022-01-17 DIAGNOSIS — Z029 Encounter for administrative examinations, unspecified: Secondary | ICD-10-CM

## 2022-01-17 NOTE — Telephone Encounter (Signed)
Left message for patient to call back and schedule Medicare Annual Wellness Visit (AWV) either virtually or in office. Left  my John Graham number [phone]   Last AWV ;09/14/20  please schedule at anytime with [facility] Nurse Health Advisor 1 or 2

## 2022-01-17 NOTE — Telephone Encounter (Signed)
Spouse (donna) returned my called  She stated she would have patient to call me when he returns

## 2022-01-19 ENCOUNTER — Other Ambulatory Visit: Payer: Self-pay | Admitting: Family Medicine

## 2022-01-20 ENCOUNTER — Ambulatory Visit (INDEPENDENT_AMBULATORY_CARE_PROVIDER_SITE_OTHER)
Admission: RE | Admit: 2022-01-20 | Discharge: 2022-01-20 | Disposition: A | Discharge status: Home / Self Care | Payer: Medicare Other | Source: Ambulatory Visit | Attending: Acute Care | Admitting: Acute Care

## 2022-01-20 DIAGNOSIS — F17210 Nicotine dependence, cigarettes, uncomplicated: Secondary | ICD-10-CM | POA: Diagnosis not present

## 2022-01-20 DIAGNOSIS — Z87891 Personal history of nicotine dependence: Secondary | ICD-10-CM

## 2022-01-23 ENCOUNTER — Telehealth: Payer: Self-pay | Admitting: Acute Care

## 2022-01-23 NOTE — Telephone Encounter (Signed)
Received below call report from [place] with [place] [facility].  Sarah please advise. Thanks  IMPRESSION: 1. Lung-RADS 4B, suspicious. Additional imaging evaluation or consultation with Pulmonology or Thoracic Surgery recommended. New right upper lobe solid and progressive right lower lobe mixed attenuation pulmonary nodules. Consider further evaluation with PET to direct biopsy. 2. No thoracic adenopathy. 3. Similar borderline ascending aortic dilatation/aneurysm. This can be re-evaluated on follow-up lung cancer screening CT. 4. Aortic atherosclerosis (ICD10-I70.0), coronary artery atherosclerosis and emphysema (ICD10-J43.9).   These results will be called to the ordering clinician or representative by the Radiologist Assistant, and communication documented in the PACS or [employer].

## 2022-01-25 ENCOUNTER — Other Ambulatory Visit: Payer: Self-pay | Admitting: Acute Care

## 2022-01-25 DIAGNOSIS — R911 Solitary pulmonary nodule: Secondary | ICD-10-CM

## 2022-01-25 NOTE — Telephone Encounter (Signed)
John Graham, please schedule for follow up after the PET scan with either Dr. Valeta Harms or Lamonte Sakai once we know the date it is scheduled. If neither of them have an opening within a week after the PET please schedule them with me. Thanks so much

## 2022-01-25 NOTE — Telephone Encounter (Signed)
I have called the patient with the results of the low dose CT Chest. I explained that his scan was read as a Lung RADS 4 B , which indicates suspicious findings for which additional diagnostic testing and or tissue sampling is recommended. I explained there is a new nodule in his right upper lobe, and an enlarging nodule in the RLL. I told him we need to further evaluate these findings with a PET scan. He verbalized understanding and   is in agreement with this plan. I have ordered the PET scan, and I told John Graham he will get a call top schedule this tomorrow. Langley Gauss, I will add Dr. Elease Hashimoto to this note so he is aware of the abnormal CT Chest and the plan for PET scan. Dr. Elease Hashimoto we will let you know what the scan shows and further plan of care based on results.

## 2022-01-25 NOTE — Progress Notes (Signed)
I have called the patient with the results of the low dose CT Chest. I explained that his scan was read as a Lung RADS 4 B indicates suspicious findings for which additional diagnostic testing and or tissue sampling is recommended. I explained there is a new nodule in his right upper lobe, and an enlarging nodule in the RLL. I told him we need to further evaluate these findings with a PET scan. He verbalized understanding and   is in agreement with this plan. Langley Gauss, I will add Dr. Elease Hashimoto to this note so he is aware of the abnormal CT Chest and the plan for PET scan.

## 2022-01-25 NOTE — Progress Notes (Signed)
Assessment/Plan:   Parkinsonian tremor  -Patient certainly has parkinsonian tremor.  He does not fully meet other criteria for Parkinson's disease.  Discussed options for medications.  Patient really does not want any currently.  Value of exercise.  Discussed importance of following up and  letting me know should any new symptoms arise before next visit.  Subjective:   WEBER MONNIER was seen today in the movement disorders clinic for neurologic consultation at the request of [name], MD.  The consultation is for the evaluation of "pill rolling tremor", R>L.  Outside records that were made available to me were reviewed.  Tremor: Yes.     How long has it been going on? Few years  At rest or with activation?  Activation, he believes.  He really does not notice it.  When is it noted the most?  writing  Fam hx of tremor?  No.  Located where?  Both hands per patient  Affected by caffeine:  doesn't drink any  Affected by alcohol:  doesn't drink any  Affected by stress:  No.  Affected by fatigue:  No.  Spills soup if on spoon:  No.  Spills glass of liquid if full:  No.   Other Specific Symptoms:  Voice: no change Sleep: sleeps well  Vivid Dreams:  No.  Acting out dreams:  No. Wet Pillows: No. Postural symptoms:  No.  Falls?  No. Bradykinesia symptoms: difficulty getting out of a chair Loss of smell:  Yes.   Loss of taste:  Yes.   Urinary Incontinence:  No. Difficulty Swallowing:  No. Handwriting, micrographia: No. Trouble with ADL's:  No.  Trouble buttoning clothing: No. Memory changes:  Yes.  , only short term.  lives with wife.  Able to do finances and he is able to prepare excel spreadsheet.  Takes pills without trouble N/V:  No. Lightheaded:  No.  Syncope: No. Diplopia:  No.   Neuroimaging of the brain has not previously been performed.   PREVIOUS MEDICATIONS: none to date  ALLERGIES:  No Known Allergies  CURRENT MEDICATIONS:  Current Outpatient  Medications  Medication Instructions   CALCIUM-MAGNESIUM-VITAMIN D PO Oral, With Zinc-Take one pill daily    metoprolol tartrate (LOPRESSOR) 25 mg, Oral, 2 times daily, *appointment required for future refills*   Multiple Vitamins-Minerals (CENTRUM PO) 1 tablet, Every morning   sertraline (ZOLOFT) 50 MG tablet TAKE 1 TABLET BY MOUTH EVERY DAY   simvastatin (ZOCOR) 40 MG tablet TAKE 1 TABLET BY MOUTH EVERYDAY AT BEDTIME    Objective:   PHYSICAL EXAMINATION:    VITALS:   Vitals:   01/30/22 0947  BP: 110/60  Pulse: 72  SpO2: 95%  Weight: 209 lb 9.6 oz (95.1 kg)  Height: 5\' 11"  (1.803 m)    GEN:  The patient appears stated age and is in NAD. HEENT:  Normocephalic, atraumatic.  The mucous membranes are moist. The superficial temporal arteries are without ropiness or tenderness. CV:  RRR Lungs:  CTAB Neck/HEME:  There are no carotid bruits bilaterally.  Neurological examination:  Orientation: The patient is alert and oriented x3.  Cranial nerves: mild right facial droop when muscles of facial expression are not activated.  When activated, he is able to activate them symmetrically.  Extraocular muscles are intact. The visual fields are full to confrontational testing. The speech is fluent and clear. Soft palate rises symmetrically and there is no tongue deviation. Hearing is significantly decreased to conversational tone. Sensation: Sensation is intact to light  touch throughout (facial, trunk, extremities). Vibration is decreased distally. There is no extinction with double simultaneous stimulation.  Motor: Strength is 5/5 in the bilateral upper and lower extremities.   Shoulder shrug is equal and symmetric.  There is no pronator drift. Deep tendon reflexes: Deep tendon reflexes are 1/4 at the bilateral biceps, triceps, brachioradialis, patella and achilles. Plantar responses are downgoing bilaterally.  Movement examination: Tone: There is nl tone in the bilateral upper extremities.   The tone in the lower extremities is nl.  Abnormal movements: there is RUE rest tremor that increases with ambulation Coordination:  There is no decremation with RAM's, with any form of RAMS, including alternating supination and pronation of the forearm, hand opening and closing, finger taps, heel taps and toe taps. Gait and Station: The patient pushes off to arise.  While he is wide-based, he does not particularly shuffle. I have reviewed and interpreted the following labs independently   Chemistry      Component Value Date/Time   NA 141 12/27/2021 1444   K 4.6 12/27/2021 1444   CL 104 12/27/2021 1444   CO2 30 12/27/2021 1444   BUN 13 12/27/2021 1444   CREATININE 1.18 12/27/2021 1444   CREATININE 1.07 06/28/2020 1542      Component Value Date/Time   CALCIUM 9.7 12/27/2021 1444   ALKPHOS 81 12/27/2021 1444   AST 18 12/27/2021 1444   ALT 14 12/27/2021 1444   BILITOT 0.7 12/27/2021 1444      Lab Results  Component Value Date   TSH 0.84 12/27/2021   Lab Results  Component Value Date   WBC 8.9 12/27/2021   HGB 15.4 12/27/2021   HCT 44.8 12/27/2021   MCV 98.4 12/27/2021   PLT 166.0 12/27/2021      Total time spent on today's visit was 45 minutes, including both face-to-face time and nonface-to-face time.  Time included that spent on review of records (prior notes available to me/labs/imaging if pertinent), discussing treatment and goals, answering patient's questions and coordinating care.  Cc:  [name], MD

## 2022-01-26 ENCOUNTER — Telehealth: Payer: Self-pay | Admitting: Acute Care

## 2022-01-26 NOTE — Telephone Encounter (Signed)
Left message for patient to call us regarding scheduling follow up appt for review of PET scan results.

## 2022-01-26 NOTE — Telephone Encounter (Signed)
Attempt to call to schedule f/up OV after PET scan.  Someone answered and could hear people talking but no one would speak on phone.

## 2022-01-27 ENCOUNTER — Other Ambulatory Visit: Payer: Self-pay

## 2022-01-27 NOTE — Telephone Encounter (Signed)
Left another VM on patient's phone and called wife's phone (per dpr) and left VM message to call to schedule follow up appt.

## 2022-01-30 ENCOUNTER — Ambulatory Visit: Payer: Medicare Other | Admitting: Neurology

## 2022-01-30 ENCOUNTER — Encounter: Payer: Self-pay | Admitting: Neurology

## 2022-01-30 VITALS — BP 110/60 | HR 72 | Ht 71.0 in | Wt 209.6 lb

## 2022-01-30 DIAGNOSIS — R251 Tremor, unspecified: Secondary | ICD-10-CM | POA: Diagnosis not present

## 2022-01-30 NOTE — Patient Instructions (Signed)
We discussed that you have some features of Parkinsons Disease but don't meet all the criteria yet.  You have decided to hold off on medication right now.  You can let me know if things get worse or if you experience falls.  The physicians and staff at [facility] are committed to providing excellent care. You may receive a survey requesting feedback about your experience at our office. We strive to receive "very good" responses to the survey questions. If you feel that your experience would prevent you from giving the office a "very good " response, please contact our office to try to remedy the situation. We may be reached at [phone]. Thank you for taking the time out of your busy day to complete the survey.

## 2022-02-01 ENCOUNTER — Ambulatory Visit (HOSPITAL_COMMUNITY)
Admission: RE | Admit: 2022-02-01 | Discharge: 2022-02-01 | Disposition: A | Discharge status: Home / Self Care | Payer: Medicare Other | Source: Ambulatory Visit | Attending: Acute Care | Admitting: Acute Care

## 2022-02-01 DIAGNOSIS — R911 Solitary pulmonary nodule: Secondary | ICD-10-CM | POA: Insufficient documentation

## 2022-02-01 DIAGNOSIS — R918 Other nonspecific abnormal finding of lung field: Secondary | ICD-10-CM | POA: Diagnosis not present

## 2022-02-01 LAB — GLUCOSE, CAPILLARY: Glucose-Capillary: 126 mg/dL — ABNORMAL HIGH (ref 70–99)

## 2022-02-01 MED ORDER — FLUDEOXYGLUCOSE F - 18 (FDG) INJECTION
10.4000 | Freq: Once | INTRAVENOUS | Status: AC
  Administered 2022-02-01: 10.4 via INTRAVENOUS

## 2022-02-03 ENCOUNTER — Ambulatory Visit: Payer: Medicare Other | Admitting: Acute Care

## 2022-02-03 ENCOUNTER — Other Ambulatory Visit: Payer: Self-pay | Admitting: Acute Care

## 2022-02-03 ENCOUNTER — Encounter: Payer: Self-pay | Admitting: Acute Care

## 2022-02-03 VITALS — BP 130/68 | HR 64 | Temp 98.3°F | Ht 71.0 in | Wt 209.8 lb

## 2022-02-03 DIAGNOSIS — R911 Solitary pulmonary nodule: Secondary | ICD-10-CM

## 2022-02-03 DIAGNOSIS — F17210 Nicotine dependence, cigarettes, uncomplicated: Secondary | ICD-10-CM

## 2022-02-03 NOTE — H&P (View-Only) (Signed)
History of Present Illness John Graham is a 76 y.o. male current every day smoker  followed by the lung cancer screening program. He is here to review an abnormal Screening CT Chest and PET scan .   02/03/2022 Pt.presents for follow up after PET CT. His last CT Chest 01/2022 was read as a Lung RADS 4 B indicates suspicious findings for which additional diagnostic testing and or tissue sampling is recommended. He had a PET scan 02/01/2022 and is here today to discuss the results. We discussed that his scan did show the nodule we are concerned about is + for new cavitary right upper lobe pulmonary nodule posterior to the right hilum shows intense hypermetabolic activity, consistent with bronchogenic carcinoma.Additionally, there is an increasingly solid and enlarging right lower lobe nodule is indeterminate, with low level hypermetabolic activity, but remains suspicious based on changes from previous studies. We discussed that it is best to biopsy this area. There is no evidence of metastatic disease, so this is early stage. He is not on inhalers or oxygen, so may be a surgical candidate if he quits smoking. I will contact Dr. Valeta Harms and Lamonte Sakai to see who is available to biopsy earliest. Pt. Understands we will call him to get this scheduled. He understands he will need a driver present with him the day of procedure. We discussed risks of bleeding, infection and pneumothorax and treatment of each.   Test Results: PET CT Chest  There are no hypermetabolic mediastinal, hilar or axillary lymph nodes. Cavitary right upper lobe nodule posterior to the right hilum measuring 8 x 8 mm on image 27/7 is intensely hypermetabolic with an SUV max 8.9. Enlarging cavitary right lower lobe nodule measuring up to 12 mm on image 50/7 demonstrates mild hypermetabolic activity (SUV max 2.2). No other hypermetabolic pulmonary activity or suspicious nodularity. Additional scattered nodules are stable, including a solid 7  mm nodule in the right lower lobe on image 41/7.   Incidental CT findings: Mild centrilobular and paraseptal emphysema. Sequela of prior granulomatous disease with multiple calcified mediastinal and hilar lymph nodes and calcified right upper lobe granuloma. Diffuse atherosclerosis of the aorta, great vessels and coronary arteries.  The new cavitary right upper lobe pulmonary nodule posterior to the right hilum shows intense hypermetabolic activity, consistent with bronchogenic carcinoma. 2. The increasingly solid and enlarging right lower lobe nodule is indeterminate, with low level hypermetabolic activity, but remains suspicious based on changes from previous studies. 3. No evidence of metastatic disease. 4. Coronary and aortic atherosclerosis (ICD10-I70.0). Emphysema (ICD10-J43.9).   ADDENDUM: An incidental finding not reported in the initial report is low level hypermetabolic activity at the gastroesophageal junction (SUV max 5.3). No corresponding anatomic abnormality on the CT images, and this finding is likely inflammatory or related to gastroesophageal reflux.   On: 02/03/2022 11:36     Low Dose CT Chest 01/20/2022 Lungs/Pleura: No pleural fluid. Mild centrilobular emphysema. A new posterior right upper lobe pulmonary nodule of volume derived equivalent diameter 14.9 mm on image 125.   Right lower lobe mixed attenuation nodule demonstrates increased solid component, including on image 227  Lung-RADS 4B, suspicious. Additional imaging evaluation or consultation with Pulmonology or Thoracic Surgery recommended. New right upper lobe solid and progressive right lower lobe mixed attenuation pulmonary nodules. Consider further evaluation with PET to direct biopsy. 2. No thoracic adenopathy. 3. Similar borderline ascending aortic dilatation/aneurysm. This can be re-evaluated on follow-up lung cancer screening CT. 4. Aortic atherosclerosis (ICD10-I70.0), coronary  artery atherosclerosis  and emphysema (ICD10-J43.9).     Latest Ref Rng & Units 12/27/2021    2:44 PM 05/14/2017    2:45 PM 04/06/2015   12:02 PM  CBC  WBC 4.0 - 10.5 K/uL 8.9  7.4  6.9   Hemoglobin 13.0 - 17.0 g/dL 15.4  14.9  14.3   Hematocrit 39.0 - 52.0 % 44.8  43.8  41.7   Platelets 150.0 - 400.0 K/uL 166.0  189.0  173.0        Latest Ref Rng & Units 12/27/2021    2:44 PM 06/28/2020    3:42 PM 06/04/2018    5:19 PM  BMP  Glucose 70 - 99 mg/dL 111  115  94   BUN 6 - 23 mg/dL 13  17  18    Creatinine 0.40 - 1.50 mg/dL 1.18  1.07  1.19   BUN/Creat Ratio 6 - 22 (calc)  NOT APPLICABLE    Sodium 378 - 145 mEq/L 141  141  138   Potassium 3.5 - 5.1 mEq/L 4.6  4.1  4.5   Chloride 96 - 112 mEq/L 104  104  103   CO2 19 - 32 mEq/L 30  28  30    Calcium 8.4 - 10.5 mg/dL 9.7  9.2  9.5     BNP No results found for: "BNP"  ProBNP No results found for: "PROBNP"  PFT No results found for: "FEV1PRE", "FEV1POST", "FVCPRE", "FVCPOST", "TLC", "DLCOUNC", "PREFEV1FVCRT", "PSTFEV1FVCRT"  NM PET Image Initial (PI) Skull Base To Thigh  Result Date: 02/03/2022 CLINICAL DATA:  Initial treatment strategy for new/enlarging right lung nodules on lung cancer screening chest CT. EXAM: NUCLEAR MEDICINE PET SKULL BASE TO THIGH TECHNIQUE: 10.4 mCi F-18 FDG was injected intravenously. Full-ring PET imaging was performed from the skull base to thigh after the radiotracer. CT data was obtained and used for attenuation correction and anatomic localization. Fasting blood glucose: 126 mg/dl COMPARISON:  Chest CT 01/20/2022 and 01/20/2021 FINDINGS: Mediastinal blood pool activity: SUV max 2.4 NECK: No hypermetabolic cervical lymph nodes are identified.There are no lesions of the pharyngeal mucosal space. Incidental CT findings: Bilateral carotid atherosclerosis. CHEST: There are no hypermetabolic mediastinal, hilar or axillary lymph nodes. Cavitary right upper lobe nodule posterior to the right hilum measuring 8 x 8 mm  on image 27/7 is intensely hypermetabolic with an SUV max 8.9. Enlarging cavitary right lower lobe nodule measuring up to 12 mm on image 50/7 demonstrates mild hypermetabolic activity (SUV max 2.2). No other hypermetabolic pulmonary activity or suspicious nodularity. Additional scattered nodules are stable, including a solid 7 mm nodule in the right lower lobe on image 41/7. Incidental CT findings: Mild centrilobular and paraseptal emphysema. Sequela of prior granulomatous disease with multiple calcified mediastinal and hilar lymph nodes and calcified right upper lobe granuloma. Diffuse atherosclerosis of the aorta, great vessels and coronary arteries. ABDOMEN/PELVIS: There is no hypermetabolic activity within the liver, adrenal glands, spleen or pancreas. There is no hypermetabolic nodal activity. Scattered bowel activity within physiologic limits. Incidental CT findings: Diffuse aortic and branch vessel atherosclerosis. Diverticular changes within the distal colon. SKELETON: There is no hypermetabolic activity to suggest osseous metastatic disease. Incidental CT findings: Mild spondylosis. IMPRESSION: 1. The new cavitary right upper lobe pulmonary nodule posterior to the right hilum shows intense hypermetabolic activity, consistent with bronchogenic carcinoma. 2. The increasingly solid and enlarging right lower lobe nodule is indeterminate, with low level hypermetabolic activity, but remains suspicious based on changes from previous studies. 3. No evidence of metastatic disease. 4. Coronary  and aortic atherosclerosis (ICD10-I70.0). Emphysema (ICD10-J43.9). Electronically Signed   By: Richardean Sale M.D.   On: 02/03/2022 08:49   CT CHEST LUNG CA SCREEN LOW DOSE W/O CM  Result Date: 01/23/2022 CLINICAL DATA:  Sixty pack-year smoking history/current smoker EXAM: CT CHEST WITHOUT CONTRAST LOW-DOSE FOR LUNG CANCER SCREENING TECHNIQUE: Multidetector CT imaging of the chest was performed following the standard  protocol without IV contrast. RADIATION DOSE REDUCTION: This exam was performed according to the departmental dose-optimization program which includes automated exposure control, adjustment of the mA and/or kV according to patient size and/or use of iterative reconstruction technique. COMPARISON:  01/20/2021 FINDINGS: Cardiovascular: Similar borderline ascending aortic dilatation/aneurysm at 4.0 cm on 27/2. Aortic atherosclerosis. Normal heart size, without pericardial effusion. Left main and 3 vessel coronary artery calcification. Mediastinum/Nodes: Calcified mediastinal and right hilar nodes are consistent with old granulomatous disease. Lungs/Pleura: No pleural fluid. Mild centrilobular emphysema. A new posterior right upper lobe pulmonary nodule of volume derived equivalent diameter 14.9 mm on image 125. Right lower lobe mixed attenuation nodule demonstrates increased solid component, including on image 227. Other calcified and noncalcified pulmonary nodules are unchanged. Upper Abdomen: Normal imaged portions of the liver, spleen, stomach, pancreas, gallbladder, adrenal glands, kidneys. Musculoskeletal: Mid and lower thoracic spondylosis. IMPRESSION: 1. Lung-RADS 4B, suspicious. Additional imaging evaluation or consultation with Pulmonology or Thoracic Surgery recommended. New right upper lobe solid and progressive right lower lobe mixed attenuation pulmonary nodules. Consider further evaluation with PET to direct biopsy. 2. No thoracic adenopathy. 3. Similar borderline ascending aortic dilatation/aneurysm. This can be re-evaluated on follow-up lung cancer screening CT. 4. Aortic atherosclerosis (ICD10-I70.0), coronary artery atherosclerosis and emphysema (ICD10-J43.9). These results will be called to the ordering clinician or representative by the Radiologist Assistant, and communication documented in the PACS or [employer]. Electronically Signed   By: Abigail Miyamoto M.D.   On: 01/23/2022 09:17     Past  medical hx Past Medical History:  Diagnosis Date   Anxiety    PTSD   Hypercholesteremia    Hyperlipidemia    Hypertension      Social History   Tobacco Use   Smoking status: Every Day    Packs/day: 1.00    Years: 53.00    Total pack years: 53.00    Types: Cigarettes   Smokeless tobacco: Never   Tobacco comments:    20 cigarettes smoked daily 02/03/22 [facility]   Vaping Use   Vaping Use: Never used  Substance Use Topics   Alcohol use: No   Drug use: No    John Graham reports that he has been smoking cigarettes. He has a 53.00 pack-year smoking history. He has never used smokeless tobacco. He reports that he does not drink alcohol and does not use drugs.  Tobacco Cessation: Current every day smoker with a 53 pack year smoking history  Past surgical hx, Family hx, Social hx all reviewed.  Current Outpatient Medications on File Prior to Visit  Medication Sig   CALCIUM-MAGNESIUM-VITAMIN D PO Take by mouth. With Zinc-Take one pill daily   metoprolol tartrate (LOPRESSOR) 25 MG tablet TAKE 1 TABLET (25 MG TOTAL) BY MOUTH 2 (TWO) TIMES DAILY. *APPOINTMENT REQUIRED FOR FUTURE REFILLS*   Multiple Vitamins-Minerals (CENTRUM PO) Take 1 tablet by mouth every morning.   sertraline (ZOLOFT) 50 MG tablet TAKE 1 TABLET BY MOUTH EVERY DAY   simvastatin (ZOCOR) 40 MG tablet TAKE 1 TABLET BY MOUTH EVERYDAY AT BEDTIME   Current Facility-Administered Medications on File Prior to Visit  Medication  0.9 %  sodium chloride infusion     No Known Allergies  Review Of Systems:  Constitutional:   No  weight loss, night sweats,  Fevers, chills, fatigue, or  lassitude.  HEENT:   No headaches,  Difficulty swallowing,  Tooth/dental problems, or  Sore throat,                No sneezing, itching, ear ache, nasal congestion, post nasal drip,   CV:  No chest pain,  Orthopnea, PND, swelling in lower extremities, anasarca, dizziness, palpitations, syncope.   GI  No heartburn, indigestion, abdominal pain,  nausea, vomiting, diarrhea, change in bowel habits, loss of appetite, bloody stools.   Resp: No shortness of breath with exertion or at rest.  No excess mucus, no productive cough,  No non-productive cough,  No coughing up of blood.  No change in color of mucus.  No wheezing.  No chest wall deformity  Skin: no rash or lesions.  GU: no dysuria, change in color of urine, no urgency or frequency.  No flank pain, no hematuria   MS:  No joint pain or swelling.  No decreased range of motion.  No back pain.  Psych:  No change in mood or affect. No depression or anxiety.  No memory loss.   Vital Signs BP 130/68 (BP Location: Right Arm, Patient Position: Sitting, Cuff Size: Normal)   Pulse 64   Temp 98.3 F (36.8 C) (Oral)   Ht 5\' 11"  (1.803 m)   Wt 209 lb 12.8 oz (95.2 kg)   SpO2 96%   BMI 29.26 kg/m    Physical Exam:  General- No distress,  A&Ox3, pleasant, slightly hard of hearing ENT: No sinus tenderness, TM clear, pale nasal mucosa, no oral exudate,no post nasal drip, no LAN Cardiac: S1, S2, regular rate and rhythm, no murmur Chest: No wheeze/ rales/ dullness; no accessory muscle use, no nasal flaring, no sternal retractions, diminished per bases Abd.: Soft Non-tender, ND, BS +, Body mass index is 29.26 kg/m. Ext: No clubbing cyanosis, edema Neuro:  normal strength, MAE x 4, A&O x 3  Skin: No rashes, warm and dry, no lesions  Psych: normal mood and behavior   Assessment/Plan Lung Nodule  posterior right upper lobe to right hilum PET avid  Enlarging cavitary right lower lobe nodule measuring up to 12 mm on image 50/7 demonstrates mild hypermetabolic activity No distant metastasis Plan It is good to see you today. You PET scan shows that the nodule we are concerned about is hypermetabolic and concerning that it is growing. We will plan to biopsy this area.  You will get a call from this office once we have you scheduled. You will get general anesthesia and will need a  driver to take you home after the procedure. ] Work on quitting smoking if you can. Please call the office if you have not heard from [place] by next Wednesday. [phone] We will be in touch to schedule Super D CT for mapping and biopsy  You can receive free nicotine replacement therapy ( patches, gum or mints) by calling 1-800-QUIT NOW. Please call so we can get you on the path to becoming  a non-smoker. I know it is hard, but you can do this!   Hypnosis for smoking cessation  [place]  Acupuncture for smoking cessation  [facility] [phone]    I spent 45 minutes dedicated to the care of this patient on the date of this encounter to  include pre-visit review of records, face-to-face time with the patient discussing conditions above, post visit ordering of testing, clinical documentation with the electronic health record, making appropriate referrals as documented, and communicating necessary information to the patient's healthcare team.   Magdalen Spatz, NP 02/03/2022  11:27 AM

## 2022-02-06 ENCOUNTER — Ambulatory Visit (INDEPENDENT_AMBULATORY_CARE_PROVIDER_SITE_OTHER)
Admission: RE | Admit: 2022-02-06 | Discharge: 2022-02-06 | Disposition: A | Discharge status: Home / Self Care | Payer: Medicare Other | Source: Ambulatory Visit | Attending: Acute Care | Admitting: Acute Care

## 2022-02-06 DIAGNOSIS — R911 Solitary pulmonary nodule: Secondary | ICD-10-CM | POA: Diagnosis not present

## 2022-02-06 DIAGNOSIS — I700 Atherosclerosis of aorta: Secondary | ICD-10-CM | POA: Diagnosis not present

## 2022-02-07 ENCOUNTER — Other Ambulatory Visit: Payer: Self-pay | Admitting: Pulmonary Disease

## 2022-02-07 ENCOUNTER — Telehealth: Payer: Self-pay | Admitting: Pulmonary Disease

## 2022-02-07 DIAGNOSIS — R911 Solitary pulmonary nodule: Secondary | ICD-10-CM | POA: Insufficient documentation

## 2022-02-07 LAB — SARS CORONAVIRUS 2 (TAT 6-24 HRS): SARS Coronavirus 2: NEGATIVE

## 2022-02-08 ENCOUNTER — Encounter (HOSPITAL_COMMUNITY): Payer: Self-pay | Admitting: Pulmonary Disease

## 2022-02-08 NOTE — Progress Notes (Signed)
PCP - Dr Carolann Littler Cardiologist - n/a Pulmonology - Eric Form, NP  CT Super D Chest x-ray - 02/06/22 EKG - DOS Stress Test - 09/13/16 ECHO - n/a Cardiac Cath - n/a  ICD Pacemaker/Loop - n/a  Sleep Study -  n/a CPAP - none  STOP now taking any Aspirin (unless otherwise instructed by your surgeon), Aleve, Naproxen, Ibuprofen, Motrin, Advil, Goody's, BC's, all herbal medications, fish oil, and all vitamins.   Coronavirus Screening Do you have any of the following symptoms:  Cough yes/no: No Fever (>100.70F)  yes/no: No Runny nose yes/no: No Sore throat yes/no: No Difficulty breathing/shortness of breath  yes/no: No  Have you traveled in the last 14 days and where? yes/no: No  Patient verbalized understanding of instructions that were given via phone.

## 2022-02-09 ENCOUNTER — Ambulatory Visit (HOSPITAL_COMMUNITY): Payer: Medicare Other | Admitting: Anesthesiology

## 2022-02-09 ENCOUNTER — Ambulatory Visit (HOSPITAL_COMMUNITY): Payer: Medicare Other

## 2022-02-09 ENCOUNTER — Encounter (HOSPITAL_COMMUNITY): Payer: Self-pay | Admitting: Pulmonary Disease

## 2022-02-09 ENCOUNTER — Ambulatory Visit (HOSPITAL_COMMUNITY)
Admission: RE | Admit: 2022-02-09 | Discharge: 2022-02-09 | Disposition: A | Discharge status: Home / Self Care | Payer: Medicare Other | Attending: Pulmonary Disease | Admitting: Pulmonary Disease

## 2022-02-09 ENCOUNTER — Encounter (HOSPITAL_COMMUNITY)
Admission: RE | Disposition: A | Discharge status: Home / Self Care | Payer: Self-pay | Source: Home / Self Care | Attending: Pulmonary Disease

## 2022-02-09 ENCOUNTER — Ambulatory Visit (HOSPITAL_BASED_OUTPATIENT_CLINIC_OR_DEPARTMENT_OTHER): Payer: Medicare Other | Admitting: Anesthesiology

## 2022-02-09 ENCOUNTER — Other Ambulatory Visit: Payer: Self-pay

## 2022-02-09 DIAGNOSIS — R911 Solitary pulmonary nodule: Secondary | ICD-10-CM

## 2022-02-09 DIAGNOSIS — F17210 Nicotine dependence, cigarettes, uncomplicated: Secondary | ICD-10-CM | POA: Diagnosis not present

## 2022-02-09 DIAGNOSIS — R918 Other nonspecific abnormal finding of lung field: Secondary | ICD-10-CM | POA: Diagnosis not present

## 2022-02-09 DIAGNOSIS — F419 Anxiety disorder, unspecified: Secondary | ICD-10-CM | POA: Diagnosis not present

## 2022-02-09 DIAGNOSIS — I10 Essential (primary) hypertension: Secondary | ICD-10-CM | POA: Insufficient documentation

## 2022-02-09 DIAGNOSIS — R846 Abnormal cytological findings in specimens from respiratory organs and thorax: Secondary | ICD-10-CM | POA: Diagnosis not present

## 2022-02-09 DIAGNOSIS — C3431 Malignant neoplasm of lower lobe, right bronchus or lung: Secondary | ICD-10-CM | POA: Diagnosis not present

## 2022-02-09 HISTORY — PX: VIDEO BRONCHOSCOPY WITH ENDOBRONCHIAL ULTRASOUND: SHX6177

## 2022-02-09 HISTORY — PX: BRONCHIAL NEEDLE ASPIRATION BIOPSY: SHX5106

## 2022-02-09 HISTORY — PX: BRONCHIAL BIOPSY: SHX5109

## 2022-02-09 HISTORY — DX: Unspecified hearing loss, unspecified ear: H91.90

## 2022-02-09 HISTORY — PX: VIDEO BRONCHOSCOPY WITH RADIAL ENDOBRONCHIAL ULTRASOUND: SHX6849

## 2022-02-09 HISTORY — PX: BRONCHIAL BRUSHINGS: SHX5108

## 2022-02-09 LAB — CBC
HCT: 41.7 % (ref 39.0–52.0)
Hemoglobin: 14 g/dL (ref 13.0–17.0)
MCH: 33.5 pg (ref 26.0–34.0)
MCHC: 33.6 g/dL (ref 30.0–36.0)
MCV: 99.8 fL (ref 80.0–100.0)
Platelets: 141 10*3/uL — ABNORMAL LOW (ref 150–400)
RBC: 4.18 MIL/uL — ABNORMAL LOW (ref 4.22–5.81)
RDW: 13 % (ref 11.5–15.5)
WBC: 7.5 10*3/uL (ref 4.0–10.5)
nRBC: 0 % (ref 0.0–0.2)

## 2022-02-09 LAB — BASIC METABOLIC PANEL
Anion gap: 12 (ref 5–15)
BUN: 14 mg/dL (ref 8–23)
CO2: 27 mmol/L (ref 22–32)
Calcium: 9.1 mg/dL (ref 8.9–10.3)
Chloride: 105 mmol/L (ref 98–111)
Creatinine, Ser: 1.16 mg/dL (ref 0.61–1.24)
GFR, Estimated: >60 mL/min (ref >60)
Glucose, Bld: 113 mg/dL — ABNORMAL HIGH (ref 70–99)
Potassium: 4.2 mmol/L (ref 3.5–5.1)
Sodium: 144 mmol/L (ref 135–145)

## 2022-02-09 SURGERY — BRONCHOSCOPY, WITH BIOPSY USING ELECTROMAGNETIC NAVIGATION
Anesthesia: General | Laterality: Bilateral

## 2022-02-09 MED ORDER — ROCURONIUM BROMIDE 10 MG/ML (PF) SYRINGE
PREFILLED_SYRINGE | INTRAVENOUS | Status: DC | PRN
  Administered 2022-02-09: 40 mg via INTRAVENOUS
  Administered 2022-02-09: 60 mg via INTRAVENOUS

## 2022-02-09 MED ORDER — OXYCODONE HCL 5 MG PO TABS: 5.0000 mg | ORAL_TABLET | Freq: Once | ORAL | Status: DC | PRN

## 2022-02-09 MED ORDER — LACTATED RINGERS IV SOLN: INTRAVENOUS | Status: DC

## 2022-02-09 MED ORDER — FENTANYL CITRATE (PF) 100 MCG/2ML IJ SOLN: 25.0000 ug | INTRAMUSCULAR | Status: DC | PRN

## 2022-02-09 MED ORDER — CHLORHEXIDINE GLUCONATE 0.12 % MT SOLN
OROMUCOSAL | Status: AC
  Administered 2022-02-09: 15 mL via OROMUCOSAL
  Filled 2022-02-09: qty 15

## 2022-02-09 MED ORDER — METOPROLOL TARTRATE 12.5 MG HALF TABLET
ORAL_TABLET | ORAL | Status: AC
  Administered 2022-02-09: 25 mg via ORAL
  Filled 2022-02-09: qty 2

## 2022-02-09 MED ORDER — LIDOCAINE 2% (20 MG/ML) 5 ML SYRINGE
INTRAMUSCULAR | Status: DC | PRN
  Administered 2022-02-09: 60 mg via INTRAVENOUS

## 2022-02-09 MED ORDER — DEXAMETHASONE SODIUM PHOSPHATE 10 MG/ML IJ SOLN
INTRAMUSCULAR | Status: DC | PRN
  Administered 2022-02-09: 10 mg via INTRAVENOUS

## 2022-02-09 MED ORDER — SUGAMMADEX SODIUM 200 MG/2ML IV SOLN
INTRAVENOUS | Status: DC | PRN
  Administered 2022-02-09: 200 mg via INTRAVENOUS

## 2022-02-09 MED ORDER — OXYCODONE HCL 5 MG/5ML PO SOLN: 5.0000 mg | Freq: Once | ORAL | Status: DC | PRN

## 2022-02-09 MED ORDER — FENTANYL CITRATE (PF) 250 MCG/5ML IJ SOLN
INTRAMUSCULAR | Status: DC | PRN
  Administered 2022-02-09: 100 ug via INTRAVENOUS

## 2022-02-09 MED ORDER — PROPOFOL 10 MG/ML IV BOLUS
INTRAVENOUS | Status: DC | PRN
  Administered 2022-02-09: 150 mg via INTRAVENOUS
  Administered 2022-02-09: 50 mg via INTRAVENOUS

## 2022-02-09 MED ORDER — ONDANSETRON HCL 4 MG/2ML IJ SOLN: 4.0000 mg | Freq: Four times a day (QID) | INTRAMUSCULAR | Status: DC | PRN

## 2022-02-09 MED ORDER — MIDAZOLAM HCL 2 MG/2ML IJ SOLN
INTRAMUSCULAR | Status: DC | PRN
  Administered 2022-02-09: 2 mg via INTRAVENOUS

## 2022-02-09 MED ORDER — ONDANSETRON HCL 4 MG/2ML IJ SOLN
INTRAMUSCULAR | Status: DC | PRN
  Administered 2022-02-09: 4 mg via INTRAVENOUS

## 2022-02-09 MED ORDER — CHLORHEXIDINE GLUCONATE 0.12 % MT SOLN
15.0000 mL | Freq: Once | OROMUCOSAL | Status: AC
Start: 2022-02-09 — End: 2022-02-09

## 2022-02-09 MED ORDER — METOPROLOL TARTRATE 12.5 MG HALF TABLET
25.0000 mg | ORAL_TABLET | Freq: Once | ORAL | Status: AC
Start: 2022-02-09 — End: 2022-02-09

## 2022-02-09 SURGICAL SUPPLY — 30 items

## 2022-02-09 NOTE — Interval H&P Note (Signed)
History and Physical Interval Note:  02/09/2022 7:17 AM  John Graham  has presented today for surgery, with the diagnosis of lung nodule.  The various methods of treatment have been discussed with the patient and family. After consideration of risks, benefits and other options for treatment, the patient has consented to  Procedure(s) with comments: ROBOTIC ASSISTED NAVIGATIONAL BRONCHOSCOPY (Bilateral) - ION w/ cios VIDEO BRONCHOSCOPY WITH ENDOBRONCHIAL ULTRASOUND (Bilateral) as a surgical intervention.  The patient's history has been reviewed, patient examined, no change in status, stable for surgery.  I have reviewed the patient's chart and labs.  Questions were answered to the patient's satisfaction.     [place]

## 2022-02-09 NOTE — Anesthesia Preprocedure Evaluation (Signed)
Anesthesia Evaluation  Patient identified by MRN, date of birth, ID band Patient awake    Reviewed: Allergy & Precautions, H&P , NPO status , Patient's Chart, lab work & pertinent test results  Airway Mallampati: II   Neck ROM: full    Dental   Pulmonary Current Smoker and Patient abstained from smoking.,    breath sounds clear to auscultation       Cardiovascular hypertension,  Rhythm:regular Rate:Normal     Neuro/Psych Anxiety    GI/Hepatic   Endo/Other    Renal/GU      Musculoskeletal  (+) Arthritis ,   Abdominal   Peds  Hematology   Anesthesia Other Findings   Reproductive/Obstetrics                             Anesthesia Physical Anesthesia Plan  ASA: 2  Anesthesia Plan: General   Post-op Pain Management:    Induction: Intravenous  PONV Risk Score and Plan: 1 and Ondansetron, Dexamethasone, Midazolam and Treatment may vary due to age or medical condition  Airway Management Planned: Oral ETT  Additional Equipment:   Intra-op Plan:   Post-operative Plan: Extubation in OR  Informed Consent: I have reviewed the patients History and Physical, chart, labs and discussed the procedure including the risks, benefits and alternatives for the proposed anesthesia with the patient or authorized representative who has indicated his/her understanding and acceptance.     Dental advisory given  Plan Discussed with: CRNA, Anesthesiologist and Surgeon  Anesthesia Plan Comments:         Anesthesia Quick Evaluation

## 2022-02-09 NOTE — Op Note (Signed)
Video Bronchoscopy with Robotic Assisted Bronchoscopic Navigation   Date of Operation: 02/09/2022   Pre-op Diagnosis: Lung nodules   Post-op Diagnosis: Lung nodules   Surgeon: [name], DO   Assistants: None   Anesthesia: General endotracheal anesthesia  Operation: Flexible video fiberoptic bronchoscopy with robotic assistance and biopsies.  Estimated Blood Loss: Minimal  Complications: None  Indications and History: John Graham is a 76 y.o. male with history of tobacco use, lung nodules. The risks, benefits, complications, treatment options and expected outcomes were discussed with the patient.  The possibilities of pneumothorax, pneumonia, reaction to medication, pulmonary aspiration, perforation of a viscus, bleeding, failure to diagnose a condition and creating a complication requiring transfusion or operation were discussed with the patient who freely signed the consent.    Description of Procedure: The patient was seen in the Preoperative Area, was examined and was deemed appropriate to proceed.  The patient was taken to [facility] endoscopy room 1, identified as John Graham and the procedure verified as Flexible Video Fiberoptic Bronchoscopy.  A Time Out was held and the above information confirmed.   Prior to the date of the procedure a high-resolution CT scan of the chest was performed. Utilizing ION software program a virtual tracheobronchial tree was generated to allow the creation of distinct navigation pathways to the patient's parenchymal abnormalities. After being taken to the operating room general anesthesia was initiated and the patient  was orally intubated. The video fiberoptic bronchoscope was introduced via the endotracheal tube and a general inspection was performed which showed normal right and left lung anatomy, aspiration of the bilateral mainstems was completed to remove any remaining secretions. Robotic catheter inserted into patient's endotracheal tube.    Target #1 Right Lower Lobe: The distinct navigation pathways prepared prior to this procedure were then utilized to navigate to patient's lesion identified on CT scan. The robotic catheter was secured into place and the vision probe was withdrawn.  Lesion location was approximated using fluoroscopy, 3D CBCT imaging and radial endobronchial ultrasound for peripheral targeting. Under fluoroscopic guidance transbronchial needle brushings and transbronchial forceps biopsies were performed to be sent for cytology and pathology. Transbronchial biopsies sent for tissue culture as well.   Target #2 Right upper lobe: The distinct navigation pathways prepared prior to this procedure were then utilized to navigate to patient's lesion identified on CT scan. The robotic catheter was secured into place and the vision probe was withdrawn.  Lesion location was approximated using fluoroscopy and radial endobronchial ultrasound for peripheral targeting. Under fluoroscopic guidance transbronchial needle biopsies, and transbronchial forceps biopsies were performed to be sent for cytology and pathology.   At the end of the procedure a general airway inspection was performed and there was no evidence of active bleeding. The bronchoscope was removed.  The patient tolerated the procedure well. There was no significant blood loss and there were no obvious complications. A post-procedural chest x-ray is pending.  Samples Target #1: 1. Transbronchial needle brushings from RLL 2. Transbronchial forceps biopsies from RLL  Samples Target #2: 1. Transbronchial needle brushings from RUL 2. Transbronchial Wang needle biopsies from RUL 3. Transbronchial forceps biopsies from RUL  Plans:  The patient will be discharged from the PACU to home when recovered from anesthesia and after chest x-ray is reviewed. We will review the cytology, pathology and microbiology results with the patient when they become available. Outpatient  followup will be with [name], DO.   [name], DO [facility] 02/09/2022 11:12 AM

## 2022-02-09 NOTE — Anesthesia Procedure Notes (Signed)
Procedure Name: Intubation Date/Time: 02/09/2022 7:42 AM  Performed by: Lance Coon, CRNAPre-anesthesia Checklist: Patient identified, Emergency Drugs available, Suction available, Patient being monitored and Timeout performed Patient Re-evaluated:Patient Re-evaluated prior to induction Oxygen Delivery Method: Circle system utilized Preoxygenation: Pre-oxygenation with 100% oxygen Induction Type: IV induction Ventilation: Mask ventilation without difficulty Laryngoscope Size: Miller and 3 Grade View: Grade II Tube type: Oral Tube size: 8.5 mm Number of attempts: 1 Airway Equipment and Method: Stylet Placement Confirmation: ETT inserted through vocal cords under direct vision, positive ETCO2 and breath sounds checked- equal and bilateral Secured at: 22 cm Tube secured with: Tape Dental Injury: Teeth and Oropharynx as per pre-operative assessment

## 2022-02-09 NOTE — Transfer of Care (Signed)
Immediate Anesthesia Transfer of Care Note  Patient: John Graham  Procedure(s) Performed: ROBOTIC ASSISTED NAVIGATIONAL BRONCHOSCOPY (Bilateral) VIDEO BRONCHOSCOPY WITH ENDOBRONCHIAL ULTRASOUND (Bilateral) BRONCHIAL BIOPSIES BRONCHIAL BRUSHINGS BRONCHIAL NEEDLE ASPIRATION BIOPSIES VIDEO BRONCHOSCOPY WITH RADIAL ENDOBRONCHIAL ULTRASOUND  Patient Location: PACU  Anesthesia Type:General  Level of Consciousness: drowsy and patient cooperative  Airway & Oxygen Therapy: Patient Spontanous Breathing and Patient connected to nasal cannula oxygen  Post-op Assessment: Report given to RN and Post -op Vital signs reviewed and stable  Post vital signs: Reviewed and stable  Last Vitals:  Vitals Value Taken Time  BP 126/99 02/09/22 0927  Temp    Pulse 70 02/09/22 0929  Resp 18 02/09/22 0929  SpO2 90 % 02/09/22 0929  Vitals shown include unvalidated device data.  Last Pain:  Vitals:   02/09/22 0640  TempSrc:   PainSc: 0-No pain      Patients Stated Pain Goal: 3 (71/69/67 [id number])  Complications: No notable events documented.

## 2022-02-09 NOTE — Discharge Instructions (Signed)
Flexible Bronchoscopy, Care After This sheet gives you information about how to care for yourself after your test. Your doctor may also give you more specific instructions. If you have problems or questions, contact your doctor. Follow these instructions at home: Eating and drinking Do not eat or drink anything (not even water) for 2 hours after your test, or until your numbing medicine (local anesthetic) wears off. When your numbness is gone and your cough and gag reflexes have come back, you may: Eat only soft foods. Slowly drink liquids. The day after the test, go back to your normal diet. Driving Do not drive for 24 hours if you were given a medicine to help you relax (sedative). Do not drive or use heavy machinery while taking prescription pain medicine. General instructions  Take over-the-counter and prescription medicines only as told by your doctor. Return to your normal activities as told. Ask what activities are safe for you. Do not use any products that have nicotine or tobacco in them. This includes cigarettes and e-cigarettes. If you need help quitting, ask your doctor. Keep all follow-up visits as told by your doctor. This is important. It is very important if you had a tissue sample (biopsy) taken. Get help right away if: You have shortness of breath that gets worse. You get light-headed. You feel like you are going to pass out (faint). You have chest pain. You cough up: More than a little blood. More blood than before. Summary Do not eat or drink anything (not even water) for 2 hours after your test, or until your numbing medicine wears off. Do not use cigarettes. Do not use e-cigarettes. Get help right away if you have chest pain.  This information is not intended to replace advice given to you by your health care provider. Make sure you discuss any questions you have with your health care provider. Document Released: 05/28/2009 Document Revised: 07/13/2017 Document  Reviewed: 08/18/2016 Elsevier Patient Education  2020 [employer].

## 2022-02-10 ENCOUNTER — Encounter (HOSPITAL_COMMUNITY): Payer: Self-pay | Admitting: Pulmonary Disease

## 2022-02-10 LAB — ACID FAST SMEAR (AFB, MYCOBACTERIA): Acid Fast Smear: NEGATIVE

## 2022-02-10 LAB — CYTOLOGY - NON PAP

## 2022-02-10 NOTE — Anesthesia Postprocedure Evaluation (Signed)
Anesthesia Post Note  Patient: JAVAN GONZAGA  Procedure(s) Performed: ROBOTIC ASSISTED NAVIGATIONAL BRONCHOSCOPY (Bilateral) VIDEO BRONCHOSCOPY WITH ENDOBRONCHIAL ULTRASOUND (Bilateral) BRONCHIAL BIOPSIES BRONCHIAL BRUSHINGS BRONCHIAL NEEDLE ASPIRATION BIOPSIES VIDEO BRONCHOSCOPY WITH RADIAL ENDOBRONCHIAL ULTRASOUND     Patient location during evaluation: PACU Anesthesia Type: General Level of consciousness: awake and alert Pain management: pain level controlled Vital Signs Assessment: post-procedure vital signs reviewed and stable Respiratory status: spontaneous breathing, nonlabored ventilation, respiratory function stable and patient connected to nasal cannula oxygen Cardiovascular status: blood pressure returned to baseline and stable Postop Assessment: no apparent nausea or vomiting Anesthetic complications: no   No notable events documented.  Last Vitals:  Vitals:   02/09/22 1042 02/09/22 1043  BP: 127/77   Pulse: 64 66  Resp: 12 18  Temp:  36.6 C  SpO2: 99% 95%    Last Pain:  Vitals:   02/09/22 1043  TempSrc:   PainSc: 0-No pain                 Precious Segall S

## 2022-02-13 ENCOUNTER — Telehealth: Payer: Self-pay | Admitting: Radiation Oncology

## 2022-02-13 ENCOUNTER — Telehealth: Payer: Self-pay | Admitting: Pulmonary Disease

## 2022-02-13 NOTE — Telephone Encounter (Signed)
ATC patient. Someone answered and then hung up the phone shortly after answering. Will try to call again.

## 2022-02-13 NOTE — Progress Notes (Signed)
  I have attempted contacting the patient at the telephone numbers provided in the chart.  Both locations biopsied right upper lobe and right lower lobe nodules were consistent with non-small cell carcinoma.  Please place referral to radiation oncology.  I called and left a VM to return our call. He has an appt coming up to see SG in clinic.   Thanks,  BLI  [name], DO [facility] 02/13/2022 10:48 AM

## 2022-02-13 NOTE — Telephone Encounter (Signed)
7/3 @ 11:34 am Left voicemail for patient to call our office.

## 2022-02-14 LAB — AEROBIC/ANAEROBIC CULTURE W GRAM STAIN (SURGICAL/DEEP WOUND)
Culture: NO GROWTH
Gram Stain: NONE SEEN

## 2022-02-15 ENCOUNTER — Telehealth: Payer: Self-pay | Admitting: Radiation Oncology

## 2022-02-15 NOTE — Progress Notes (Signed)
I spoke with the pt and let him know provider needs to go over results. Referral not placed yet since he is not yet aware of dx. John Graham- he says he will be available all day today and can be reached at [phone]. Let me know when you speak with him so that referral can be placed. Thanks.

## 2022-02-15 NOTE — Telephone Encounter (Signed)
7/5 @ 8:31 am Left voicemail for patient to call our office to be schedule for consult with Dr. Tammi Klippel.

## 2022-02-15 NOTE — Telephone Encounter (Signed)
Called and spoke with patient, provided results per Dr. Valeta Harms: I have attempted contacting the patient at the telephone numbers provided in the chart.  Both locations biopsied right upper lobe and right lower lobe nodules were consistent with non-small cell carcinoma.   Please place referral to radiation oncology.   I called and left a VM to return our call. He has an appt coming up to see SG in clinic.    Thanks,   BLI   [name], DO [facility] 02/13/2022 10:48 AM   I offered to see if we had any openings for f/u prior to 7/18 and patient declined, he said he would come in on 7/18.  Nothing further needed.

## 2022-02-17 ENCOUNTER — Telehealth: Payer: Self-pay | Admitting: *Deleted

## 2022-02-17 ENCOUNTER — Encounter: Payer: Self-pay | Admitting: Acute Care

## 2022-02-17 ENCOUNTER — Other Ambulatory Visit: Payer: Self-pay | Admitting: *Deleted

## 2022-02-17 ENCOUNTER — Ambulatory Visit (INDEPENDENT_AMBULATORY_CARE_PROVIDER_SITE_OTHER): Payer: Medicare Other | Admitting: Acute Care

## 2022-02-17 VITALS — BP 112/60 | HR 81 | Temp 97.9°F | Ht 71.0 in | Wt 207.8 lb

## 2022-02-17 DIAGNOSIS — C3411 Malignant neoplasm of upper lobe, right bronchus or lung: Secondary | ICD-10-CM

## 2022-02-17 DIAGNOSIS — F17210 Nicotine dependence, cigarettes, uncomplicated: Secondary | ICD-10-CM | POA: Diagnosis not present

## 2022-02-17 DIAGNOSIS — C3491 Malignant neoplasm of unspecified part of right bronchus or lung: Secondary | ICD-10-CM | POA: Insufficient documentation

## 2022-02-17 DIAGNOSIS — F17200 Nicotine dependence, unspecified, uncomplicated: Secondary | ICD-10-CM

## 2022-02-17 NOTE — Telephone Encounter (Signed)
ATC patient's daughter Vicente Males), DPR x1 to schedule a PFT per SG.  Left detailed message to return call.   When daughter returns call please schedule patient for PFT.

## 2022-02-17 NOTE — Progress Notes (Signed)
The proposed treatment discussed in conference is for discussion purpose only and is not a binding recommendation.  The patients have not been physically examined, or presented with their treatment options.  Therefore, final treatment plans cannot be decided.  

## 2022-02-17 NOTE — Progress Notes (Signed)
History of Present Illness John Graham is a 76 y.o. male current every day smoker  followed by the lung cancer screening program. He has a new diagnosis of adenocarcinoma of the right lung. Dr. Valeta Harms did Flexible video fiberoptic bronchoscopy with robotic assistance and biopsies on 02/09/2022.    02/17/2022 Pt.presents for follow up. He states he has done well after his bronch. No bleeding after his bronch. No fever. No sore throat.  He is still smoking a pack a day. We reviewed the results of his bronchoscopy, which was + for adenocarcinoma in the right lower lobe and atypical cells with features suggestive of non-small cell  Carcinoma in the right upper lobe. We discussed surgery as an option . He is not interested in surgery as an option as he does not want to quit smoking and he does not think he has the PFT's to qualify for surgery. He may change his mind. We will do PFT's  in the event he does opt to see surgery. I will also refer to Medical oncology to be complete. He is scheduled to see Dr. Tammi Klippel on 02/22/2022. He is very hard of hearing. He has asked that we have his daughter added to his DPR, and would prefer all appointments are scheduled through her. Nursing is making sure paperwork is completed to make this change.   Test Results: 02/09/2022  Bronch Biopsies A. LUNG, RLL NODULE, NEEDLE:  - Malignant cells are present with features consistent with  adenocarcinoma.   B. LUNG, RLL, BRUSHING:  - No malignant cells are identified   D. LUNG, RUL, NEEDLE:  - Atypical cells are present with features suggestive of non-small cell  carcinoma.     Latest Ref Rng & Units 02/09/2022    6:37 AM 12/27/2021    2:44 PM 05/14/2017    2:45 PM  CBC  WBC 4.0 - 10.5 K/uL 7.5  8.9  7.4   Hemoglobin 13.0 - 17.0 g/dL 14.0  15.4  14.9   Hematocrit 39.0 - 52.0 % 41.7  44.8  43.8   Platelets 150 - 400 K/uL 141  166.0  189.0        Latest Ref Rng & Units 02/09/2022    6:37 AM 12/27/2021    2:44 PM  06/28/2020    3:42 PM  BMP  Glucose 70 - 99 mg/dL 113  111  115   BUN 8 - 23 mg/dL _0 Creatinine 0.61 - 1.24 mg/dL 1.16  1.18  1.07   BUN/Creat Ratio 6 - 22 (calc)   NOT APPLICABLE   Sodium 301 - 145 mmol/L 144  141  141   Potassium 3.5 - 5.1 mmol/L 4.2  4.6  4.1   Chloride 98 - 111 mmol/L 105  104  104   CO2 22 - 32 mmol/L _1 Calcium 8.9 - 10.3 mg/dL 9.1  9.7  9.2     BNP No results found for: "BNP"  ProBNP No results found for: "PROBNP"  PFT No results found for: "FEV1PRE", "FEV1POST", "FVCPRE", "FVCPOST", "TLC", "DLCOUNC", "PREFEV1FVCRT", "PSTFEV1FVCRT"  DG CHEST PORT 1 VIEW  Result Date: 02/09/2022 CLINICAL DATA:  76 year old male status post bronchoscopic biopsies. EXAM: PORTABLE CHEST 1 VIEW COMPARISON:  Chest CT 02/06/2022. FINDINGS: Portable AP upright view at 0946 hours. Lung volumes and mediastinal contours are stable. Coarsely calcified right paratracheal lymph node again noted. Small right upper lobe calcified granuloma. Subtle patchy opacity at the  medial right lung base. No pneumothorax, pulmonary edema, pleural effusion, or other confluent pulmonary opacity. Tracheal air column appears stable. No acute osseous abnormality identified. IMPRESSION: Status post bronchoscopy. Subtle patchy opacity at the medial right lung base, such as due to BAL. No pneumothorax or other acute cardiopulmonary abnormality following bronchoscopic biopsies. Electronically Signed   By: Genevie Ann M.D.   On: 02/09/2022 09:58   DG C-ARM BRONCHOSCOPY  Result Date: 02/09/2022 C-ARM BRONCHOSCOPY: Fluoroscopy was utilized by the requesting physician.  No radiographic interpretation.   DG C-Arm 1-60 Min-No Report  Result Date: 02/09/2022 Fluoroscopy was utilized by the requesting physician.  No radiographic interpretation.   CT Super D Chest Wo Contrast  Result Date: 02/06/2022 CLINICAL DATA:  Follow-up of right upper lobe pulmonary nodule. * Tracking Code: BO * EXAM: CT CHEST  WITHOUT CONTRAST TECHNIQUE: Multidetector CT imaging of the chest was performed using thin slice collimation for electromagnetic bronchoscopy planning purposes, without intravenous contrast. RADIATION DOSE REDUCTION: This exam was performed according to the departmental dose-optimization program which includes automated exposure control, adjustment of the mA and/or kV according to patient size and/or use of iterative reconstruction technique. COMPARISON:  PET of 02/01/2022. Lung cancer screening CT 01/20/2022. FINDINGS: Cardiovascular: Aortic atherosclerosis. Tortuous thoracic aorta. Normal heart size, without pericardial effusion. Left main and 3 vessel coronary artery calcification. Mediastinum/Nodes: No supraclavicular adenopathy. Calcified mediastinal nodes are likely related to old granulomatous disease. Hilar regions poorly evaluated without intravenous contrast. Lungs/Pleura: No pleural fluid.  Mild centrilobular emphysema. Right apical calcified granuloma laterally. The partially cavitary, partially solid right lower lobe pulmonary nodule demonstrates a solid component of 11 mm on 111/3, similar to on the prior exam. More anterior and superior right lower lobe pulmonary nodule measures 7 mm on 83/3 and is similar back to 01/20/2021. The posterior right upper lobe nodule measures on the order of 1.1 x 1.1 cm on 59/3 and is similar to on the prior. Upper Abdomen: Tiny low-density liver lesions are likely cysts. Old granulomatous disease in the spleen. Normal imaged portions of the stomach, pancreas, adrenal glands, kidneys. Musculoskeletal: No acute osseous abnormality. Lower thoracic spondylosis. IMPRESSION: 1. No change in a suspicious right upper and right lower lobe pulmonary nodules since the lung cancer screening CT of 01/20/2022. 2. No thoracic adenopathy. 3. Aortic atherosclerosis (ICD10-I70.0), coronary artery atherosclerosis and emphysema (ICD10-J43.9). Electronically Signed   By: Abigail Miyamoto M.D.    On: 02/06/2022 15:23   NM PET Image Initial (PI) Skull Base To Thigh  Addendum Date: 02/03/2022   ADDENDUM REPORT: 02/03/2022 11:36 ADDENDUM: An incidental finding not reported in the initial report is low level hypermetabolic activity at the gastroesophageal junction (SUV max 5.3). No corresponding anatomic abnormality on the CT images, and this finding is likely inflammatory or related to gastroesophageal reflux. Electronically Signed   By: Richardean Sale M.D.   On: 02/03/2022 11:36   Result Date: 02/03/2022 CLINICAL DATA:  Initial treatment strategy for new/enlarging right lung nodules on lung cancer screening chest CT. EXAM: NUCLEAR MEDICINE PET SKULL BASE TO THIGH TECHNIQUE: 10.4 mCi F-18 FDG was injected intravenously. Full-ring PET imaging was performed from the skull base to thigh after the radiotracer. CT data was obtained and used for attenuation correction and anatomic localization. Fasting blood glucose: 126 mg/dl COMPARISON:  Chest CT 01/20/2022 and 01/20/2021 FINDINGS: Mediastinal blood pool activity: SUV max 2.4 NECK: No hypermetabolic cervical lymph nodes are identified.There are no lesions of the pharyngeal mucosal space. Incidental CT findings: Bilateral carotid atherosclerosis.  CHEST: There are no hypermetabolic mediastinal, hilar or axillary lymph nodes. Cavitary right upper lobe nodule posterior to the right hilum measuring 8 x 8 mm on image 27/7 is intensely hypermetabolic with an SUV max 8.9. Enlarging cavitary right lower lobe nodule measuring up to 12 mm on image 50/7 demonstrates mild hypermetabolic activity (SUV max 2.2). No other hypermetabolic pulmonary activity or suspicious nodularity. Additional scattered nodules are stable, including a solid 7 mm nodule in the right lower lobe on image 41/7. Incidental CT findings: Mild centrilobular and paraseptal emphysema. Sequela of prior granulomatous disease with multiple calcified mediastinal and hilar lymph nodes and calcified right  upper lobe granuloma. Diffuse atherosclerosis of the aorta, great vessels and coronary arteries. ABDOMEN/PELVIS: There is no hypermetabolic activity within the liver, adrenal glands, spleen or pancreas. There is no hypermetabolic nodal activity. Scattered bowel activity within physiologic limits. Incidental CT findings: Diffuse aortic and branch vessel atherosclerosis. Diverticular changes within the distal colon. SKELETON: There is no hypermetabolic activity to suggest osseous metastatic disease. Incidental CT findings: Mild spondylosis. IMPRESSION: 1. The new cavitary right upper lobe pulmonary nodule posterior to the right hilum shows intense hypermetabolic activity, consistent with bronchogenic carcinoma. 2. The increasingly solid and enlarging right lower lobe nodule is indeterminate, with low level hypermetabolic activity, but remains suspicious based on changes from previous studies. 3. No evidence of metastatic disease. 4. Coronary and aortic atherosclerosis (ICD10-I70.0). Emphysema (ICD10-J43.9). Electronically Signed: By: Richardean Sale M.D. On: 02/03/2022 08:49   CT CHEST LUNG CA SCREEN LOW DOSE W/O CM  Result Date: 01/23/2022 CLINICAL DATA:  Sixty pack-year smoking history/current smoker EXAM: CT CHEST WITHOUT CONTRAST LOW-DOSE FOR LUNG CANCER SCREENING TECHNIQUE: Multidetector CT imaging of the chest was performed following the standard protocol without IV contrast. RADIATION DOSE REDUCTION: This exam was performed according to the departmental dose-optimization program which includes automated exposure control, adjustment of the mA and/or kV according to patient size and/or use of iterative reconstruction technique. COMPARISON:  01/20/2021 FINDINGS: Cardiovascular: Similar borderline ascending aortic dilatation/aneurysm at 4.0 cm on 27/2. Aortic atherosclerosis. Normal heart size, without pericardial effusion. Left main and 3 vessel coronary artery calcification. Mediastinum/Nodes: Calcified  mediastinal and right hilar nodes are consistent with old granulomatous disease. Lungs/Pleura: No pleural fluid. Mild centrilobular emphysema. A new posterior right upper lobe pulmonary nodule of volume derived equivalent diameter 14.9 mm on image 125. Right lower lobe mixed attenuation nodule demonstrates increased solid component, including on image 227. Other calcified and noncalcified pulmonary nodules are unchanged. Upper Abdomen: Normal imaged portions of the liver, spleen, stomach, pancreas, gallbladder, adrenal glands, kidneys. Musculoskeletal: Mid and lower thoracic spondylosis. IMPRESSION: 1. Lung-RADS 4B, suspicious. Additional imaging evaluation or consultation with Pulmonology or Thoracic Surgery recommended. New right upper lobe solid and progressive right lower lobe mixed attenuation pulmonary nodules. Consider further evaluation with PET to direct biopsy. 2. No thoracic adenopathy. 3. Similar borderline ascending aortic dilatation/aneurysm. This can be re-evaluated on follow-up lung cancer screening CT. 4. Aortic atherosclerosis (ICD10-I70.0), coronary artery atherosclerosis and emphysema (ICD10-J43.9). These results will be called to the ordering clinician or representative by the Radiologist Assistant, and communication documented in the PACS or [employer]. Electronically Signed   By: Abigail Miyamoto M.D.   On: 01/23/2022 09:17     Past medical hx Past Medical History:  Diagnosis Date   Anxiety    PTSD   HOH (hard of hearing)    no hearing aids   Hypercholesteremia    Hyperlipidemia    Hypertension  Social History   Tobacco Use   Smoking status: Every Day    Packs/day: 1.00    Years: 53.00    Total pack years: 53.00    Types: Cigarettes   Smokeless tobacco: Never   Tobacco comments:    20 cigarettes smoked daily 02/17/2022 hfb  Vaping Use   Vaping Use: Never used  Substance Use Topics   Alcohol use: No   Drug use: No    Mr.Magouirk reports that he has been smoking  cigarettes. He has a 53.00 pack-year smoking history. He has never used smokeless tobacco. He reports that he does not drink alcohol and does not use drugs.  Tobacco Cessation: Current every day smoker , Does not want to quit. Understands the risks of continuing to smoke. 53 pack year smoking history   Past surgical hx, Family hx, Social hx all reviewed.  Current Outpatient Medications on File Prior to Visit  Medication Sig   metoprolol tartrate (LOPRESSOR) 25 MG tablet TAKE 1 TABLET (25 MG TOTAL) BY MOUTH 2 (TWO) TIMES DAILY. *APPOINTMENT REQUIRED FOR FUTURE REFILLS*   Multiple Minerals-Vitamins (CAL MAG ZINC +D3 PO) Take 1 tablet by mouth every evening.   Multiple Vitamin (MULTIVITAMIN WITH MINERALS) TABS tablet Take 1 tablet by mouth in the morning. Centrum   sertraline (ZOLOFT) 50 MG tablet TAKE 1 TABLET BY MOUTH EVERY DAY   simvastatin (ZOCOR) 40 MG tablet TAKE 1 TABLET BY MOUTH EVERYDAY AT BEDTIME   Current Facility-Administered Medications on File Prior to Visit  Medication   0.9 %  sodium chloride infusion     No Known Allergies  Review Of Systems:  Constitutional:   No  weight loss, night sweats,  Fevers, chills, +fatigue, or  lassitude.  HEENT:   No headaches,  Difficulty swallowing,  Tooth/dental problems, or  Sore throat,                No sneezing, itching, ear ache, nasal congestion, post nasal drip,   CV:  No chest pain,  Orthopnea, PND, swelling in lower extremities, anasarca, dizziness, palpitations, syncope.   GI  No heartburn, indigestion, abdominal pain, nausea, vomiting, diarrhea, change in bowel habits, loss of appetite, bloody stools.   Resp: + shortness of breath with exertion less at rest.  No excess mucus, no productive cough,  No non-productive cough,  No coughing up of blood.  No change in color of mucus.  No wheezing.  No chest wall deformity  Skin: no rash or lesions.  GU: no dysuria, change in color of urine, no urgency or frequency.  No flank  pain, no hematuria   MS:  No joint pain or swelling.  No decreased range of motion.  No back pain.  Psych:  No change in mood or affect. No depression or anxiety.  No memory loss.   Vital Signs BP 112/60 (BP Location: Right Arm, Patient Position: Sitting, Cuff Size: Large)   Pulse 81   Temp 97.9 F (36.6 C) (Oral)   Ht _0  (1.803 m)   Wt 207 lb 12.8 oz (94.3 kg)   SpO2 94%   BMI 28.98 kg/m    Physical Exam:  General- No distress,  A&Ox3, pleasant  ENT: No sinus tenderness, TM clear, pale nasal mucosa, no oral exudate,no post nasal drip, no LAN, + HOH Cardiac: S1, S2, regular rate and rhythm, no murmur Chest: No wheeze/ rales/ dullness; no accessory muscle use, no nasal flaring, no sternal retractions, diminished per bases Abd.: Soft Non-tender, ND, BS +,  Body mass index is 28.98 kg/m.  Ext: No clubbing cyanosis, trace edema Neuro:  normal strength, MAE x 4, A&O x 3 Skin: No rashes, warm and dry, no lesions  Psych: normal mood and behavior   Assessment/Plan New diagnosis Adenocarcinoma of the right upper and lower lung Current Every Day smoker  Plan Pt. Has an appointment with Dr. Tammi Klippel Radiation oncology 02/22/2022 Plan is for SBRT. Dr. Tammi Klippel  can give you information about radiation treatments.  Please let us know if you change your mind about referral to surgery. We will do PFT's in the event you change your mind. We will place a referral to Medical oncology to be thorough. You will get a call to schedule this appointment. Call if you have any bleeding from your lungs.  Call to see [place] sooner as needed. Work on quitting smoking Please contact office for sooner follow up if symptoms do not improve or worsen or seek emergency care  I spent 40 minutes dedicated to the care of this patient on the date of this encounter to include pre-visit review of records, face-to-face time with the patient discussing conditions above, post visit ordering of testing, clinical  documentation with the electronic health record, making appropriate referrals as documented, and communicating necessary information to the patient's healthcare team.       Magdalen Spatz, NP 02/17/2022  12:35 PM

## 2022-02-17 NOTE — Progress Notes (Signed)
Radiation Oncology         [phone] ________________________________  Initial outpatient Consultation  Name: John Graham MRN: [id number]  Date of Service: 02/22/2022 DOB: 1946/06/26  VO:[id number], Alinda Sierras, MD  [name], DO   REFERRING PHYSICIAN: [name], DO  DIAGNOSIS: 76 year old male with newly diagnosed Stage IA, NSCLC, adenocarcinoma of the RUL and RLL lung.    ICD-10-CM   1. Non-small cell cancer of right lung (HCC)  C34.91       HISTORY OF PRESENT ILLNESS: John Graham is a 76 y.o. male seen at the request of Dr. Valeta Harms.  He is a current smoker and has been followed with annual low-dose screening CT chest scan since at least 2016.  His most recent scan on 01/20/2022 showed a new 1.5 cm nodule in the posterior right upper lobe and a progressive, mixed attenuation nodule in the right lower lobe lung.  A PET scan was performed on 02/01/2022 and showed intense hypermetabolic activity in the new cavitary right upper lobe nodule posterior to the right hilum.  The increasingly solid and enlarging right lower lobe nodule was indeterminate with low-level hypermetabolic activity but remains suspicious.  There was no evidence of metastatic disease.  He underwent robotic assisted bronchoscopy under the care of Dr. Valeta Harms on 02/09/2022 with sampling of the right upper lobe and right lower lobe nodules.  Final surgical pathology confirmed NSCLC, adenocarcinoma in both lesions.  The patient is adamantly not interested in surgery, as he is not interested in quitting smoking.  Therefore, he has kindly been referred today for discussion of his radiation treatment options.  PREVIOUS RADIATION THERAPY: No  PAST MEDICAL HISTORY:  Past Medical History:  Diagnosis Date   Anxiety    PTSD   HOH (hard of hearing)    no hearing aids   Hypercholesteremia    Hyperlipidemia    Hypertension       PAST SURGICAL HISTORY: Past Surgical History:  Procedure Laterality Date   BRONCHIAL  BIOPSY  02/09/2022   Procedure: BRONCHIAL BIOPSIES;  Surgeon: [name], DO;  Location: [facility];  Service: Pulmonary;;   BRONCHIAL BRUSHINGS  02/09/2022   Procedure: BRONCHIAL BRUSHINGS;  Surgeon: [name], DO;  Location: [facility];  Service: Pulmonary;;   BRONCHIAL NEEDLE ASPIRATION BIOPSY  02/09/2022   Procedure: BRONCHIAL NEEDLE ASPIRATION BIOPSIES;  Surgeon: [name], DO;  Location: [facility];  Service: Pulmonary;;   COLONOSCOPY     DENTAL SURGERY     8 dental implants   POLYPECTOMY     VIDEO BRONCHOSCOPY WITH ENDOBRONCHIAL ULTRASOUND Bilateral 02/09/2022   Procedure: VIDEO BRONCHOSCOPY WITH ENDOBRONCHIAL ULTRASOUND;  Surgeon: [name], DO;  Location: [facility];  Service: Pulmonary;  Laterality: Bilateral;   VIDEO BRONCHOSCOPY WITH RADIAL ENDOBRONCHIAL ULTRASOUND  02/09/2022   Procedure: VIDEO BRONCHOSCOPY WITH RADIAL ENDOBRONCHIAL ULTRASOUND;  Surgeon: [name], DO;  Location: [facility];  Service: Pulmonary;;    FAMILY HISTORY:  Family History  Problem Relation Age of Onset   Pancreatic cancer Mother    Cancer Father        Lymph Nodes   Colon cancer Neg Hx     SOCIAL HISTORY:  Social History   Socioeconomic History   Marital status: Married    Spouse name: Not on file   Number of children: Not on file   Years of education: Not on file   Highest education level: Not on file  Occupational History   Not  on file  Tobacco Use   Smoking status: Every Day    Packs/day: 1.00    Years: 53.00    Total pack years: 53.00    Types: Cigarettes   Smokeless tobacco: Never   Tobacco comments:    20 cigarettes smoked daily 02/17/2022 hfb  Vaping Use   Vaping Use: Never used  Substance and Sexual Activity   Alcohol use: No   Drug use: No   Sexual activity: Not on file  Other Topics Concern   Not on file  Social History Narrative   ** Merged History Encounter **    Left handed    IT sales professional    Social  Determinants of Health   Financial Resource Strain: Low Risk  (12/23/2020)   Overall Financial Resource Strain (CARDIA)    Difficulty of Paying Living Expenses: Not hard at all  Food Insecurity: No Food Insecurity (09/14/2020)   Hunger Vital Sign    Worried About Running Out of Food in the Last Year: Never true    [place] in the Last Year: Never true  Transportation Needs: No Transportation Needs (12/23/2020)   PRAPARE - Hydrologist (Medical): No    Lack of Transportation (Non-Medical): No  Physical Activity: Inactive (09/14/2020)   Exercise Vital Sign    Days of Exercise per Week: 0 days    Minutes of Exercise per Session: 0 min  Stress: No Stress Concern Present (09/14/2020)   [place]    Feeling of Stress : Not at all  Social Connections: Socially Isolated (09/14/2020)   Social Connection and Isolation Panel [NHANES]    Frequency of Communication with Friends and Family: Once a week    Frequency of Social Gatherings with Friends and Family: Never    Attends Religious Services: Never    Marine scientist or Organizations: No    Attends Archivist Meetings: Never    Marital Status: Married  Human resources officer Violence: Not At Risk (09/14/2020)   Humiliation, Afraid, Rape, and Kick questionnaire    Fear of Current or Ex-Partner: No    Emotionally Abused: No    Physically Abused: No    Sexually Abused: No    ALLERGIES: Patient has no known allergies.  MEDICATIONS:  Current Outpatient Medications  Medication Sig Dispense Refill   metoprolol tartrate (LOPRESSOR) 25 MG tablet TAKE 1 TABLET (25 MG TOTAL) BY MOUTH 2 (TWO) TIMES DAILY. *APPOINTMENT REQUIRED FOR FUTURE REFILLS* 180 tablet 0   Multiple Minerals-Vitamins (CAL MAG ZINC +D3 PO) Take 1 tablet by mouth every evening.     Multiple Vitamin (MULTIVITAMIN WITH MINERALS) TABS tablet Take 1 tablet by mouth in the morning.  Centrum     sertraline (ZOLOFT) 50 MG tablet TAKE 1 TABLET BY MOUTH EVERY DAY 90 tablet 3   simvastatin (ZOCOR) 40 MG tablet TAKE 1 TABLET BY MOUTH EVERYDAY AT BEDTIME 90 tablet 3   Current Facility-Administered Medications  Medication Dose Route Frequency Provider Last Rate Last Admin   0.9 %  sodium chloride infusion  500 mL Intravenous Once Irene Shipper, MD        REVIEW OF SYSTEMS:  On review of systems, the patient reports that he is doing well overall. He denies any chest pain, increased shortness of breath, cough, fevers, chills, night sweats, unintended weight changes.  He denies any bowel or bladder disturbances, and denies abdominal pain, nausea or vomiting.  He denies any new musculoskeletal or joint aches or pains. A complete review of systems is obtained and is otherwise negative.    PHYSICAL EXAM:  Wt Readings from Last 3 Encounters:  02/17/22 207 lb 12.8 oz (94.3 kg)  02/09/22 211 lb 6.7 oz (95.9 kg)  02/03/22 209 lb 12.8 oz (95.2 kg)   Temp Readings from Last 3 Encounters:  02/17/22 97.9 F (36.6 C) (Oral)  02/09/22 97.9 F (36.6 C)  02/03/22 98.3 F (36.8 C) (Oral)   BP Readings from Last 3 Encounters:  02/17/22 112/60  02/09/22 127/77  02/03/22 130/68   Pulse Readings from Last 3 Encounters:  02/17/22 81  02/09/22 66  02/03/22 64   Pain Assessment Pain Score: 0-No pain/10  In general this is a well appearing Caucasian male in no acute distress.  He's extremely hard of hearing but is alert and oriented x4 and appropriate throughout the examination. Cardiopulmonary assessment is negative for acute distress and he exhibits normal effort.   KPS = 90  100 - Normal; no complaints; no evidence of disease. 90   - Able to carry on normal activity; minor signs or symptoms of disease. 80   - Normal activity with effort; some signs or symptoms of disease. 96   - Cares for self; unable to carry on normal activity or to do active work. 60   - Requires occasional  assistance, but is able to care for most of his personal needs. 50   - Requires considerable assistance and frequent medical care. 41   - Disabled; requires special care and assistance. 56   - Severely disabled; hospital admission is indicated although death not imminent. 8   - Very sick; hospital admission necessary; active supportive treatment necessary. 10   - Moribund; fatal processes progressing rapidly. 0     - Dead  Karnofsky DA, Abelmann [place], Craver LS and Burchenal JH [phone]) The use of the nitrogen mustards in the palliative treatment of carcinoma: with particular reference to bronchogenic carcinoma Cancer 1 [id number]  LABORATORY DATA:  Lab Results  Component Value Date   WBC 7.5 02/09/2022   HGB 14.0 02/09/2022   HCT 41.7 02/09/2022   MCV 99.8 02/09/2022   PLT 141 (L) 02/09/2022   Lab Results  Component Value Date   NA 144 02/09/2022   K 4.2 02/09/2022   CL 105 02/09/2022   CO2 27 02/09/2022   Lab Results  Component Value Date   ALT 14 12/27/2021   AST 18 12/27/2021   ALKPHOS 81 12/27/2021   BILITOT 0.7 12/27/2021     RADIOGRAPHY: DG CHEST PORT 1 VIEW  Result Date: 02/09/2022 CLINICAL DATA:  76 year old male status post bronchoscopic biopsies. EXAM: PORTABLE CHEST 1 VIEW COMPARISON:  Chest CT 02/06/2022. FINDINGS: Portable AP upright view at 0946 hours. Lung volumes and mediastinal contours are stable. Coarsely calcified right paratracheal lymph node again noted. Small right upper lobe calcified granuloma. Subtle patchy opacity at the medial right lung base. No pneumothorax, pulmonary edema, pleural effusion, or other confluent pulmonary opacity. Tracheal air column appears stable. No acute osseous abnormality identified. IMPRESSION: Status post bronchoscopy. Subtle patchy opacity at the medial right lung base, such as due to BAL. No pneumothorax or other acute cardiopulmonary abnormality following bronchoscopic biopsies. Electronically Signed   By: Genevie Ann M.D.   On:  02/09/2022 09:58   DG C-ARM BRONCHOSCOPY  Result Date: 02/09/2022 C-ARM BRONCHOSCOPY: Fluoroscopy was utilized by the requesting physician.  No radiographic interpretation.   DG  C-Arm 1-60 Min-No Report  Result Date: 02/09/2022 Fluoroscopy was utilized by the requesting physician.  No radiographic interpretation.   CT Super D Chest Wo Contrast  Result Date: 02/06/2022 CLINICAL DATA:  Follow-up of right upper lobe pulmonary nodule. * Tracking Code: BO * EXAM: CT CHEST WITHOUT CONTRAST TECHNIQUE: Multidetector CT imaging of the chest was performed using thin slice collimation for electromagnetic bronchoscopy planning purposes, without intravenous contrast. RADIATION DOSE REDUCTION: This exam was performed according to the departmental dose-optimization program which includes automated exposure control, adjustment of the mA and/or kV according to patient size and/or use of iterative reconstruction technique. COMPARISON:  PET of 02/01/2022. Lung cancer screening CT 01/20/2022. FINDINGS: Cardiovascular: Aortic atherosclerosis. Tortuous thoracic aorta. Normal heart size, without pericardial effusion. Left main and 3 vessel coronary artery calcification. Mediastinum/Nodes: No supraclavicular adenopathy. Calcified mediastinal nodes are likely related to old granulomatous disease. Hilar regions poorly evaluated without intravenous contrast. Lungs/Pleura: No pleural fluid.  Mild centrilobular emphysema. Right apical calcified granuloma laterally. The partially cavitary, partially solid right lower lobe pulmonary nodule demonstrates a solid component of 11 mm on 111/3, similar to on the prior exam. More anterior and superior right lower lobe pulmonary nodule measures 7 mm on 83/3 and is similar back to 01/20/2021. The posterior right upper lobe nodule measures on the order of 1.1 x 1.1 cm on 59/3 and is similar to on the prior. Upper Abdomen: Tiny low-density liver lesions are likely cysts. Old granulomatous  disease in the spleen. Normal imaged portions of the stomach, pancreas, adrenal glands, kidneys. Musculoskeletal: No acute osseous abnormality. Lower thoracic spondylosis. IMPRESSION: 1. No change in a suspicious right upper and right lower lobe pulmonary nodules since the lung cancer screening CT of 01/20/2022. 2. No thoracic adenopathy. 3. Aortic atherosclerosis (ICD10-I70.0), coronary artery atherosclerosis and emphysema (ICD10-J43.9). Electronically Signed   By: Abigail Miyamoto M.D.   On: 02/06/2022 15:23   NM PET Image Initial (PI) Skull Base To Thigh  Addendum Date: 02/03/2022   ADDENDUM REPORT: 02/03/2022 11:36 ADDENDUM: An incidental finding not reported in the initial report is low level hypermetabolic activity at the gastroesophageal junction (SUV max 5.3). No corresponding anatomic abnormality on the CT images, and this finding is likely inflammatory or related to gastroesophageal reflux. Electronically Signed   By: Richardean Sale M.D.   On: 02/03/2022 11:36   Result Date: 02/03/2022 CLINICAL DATA:  Initial treatment strategy for new/enlarging right lung nodules on lung cancer screening chest CT. EXAM: NUCLEAR MEDICINE PET SKULL BASE TO THIGH TECHNIQUE: 10.4 mCi F-18 FDG was injected intravenously. Full-ring PET imaging was performed from the skull base to thigh after the radiotracer. CT data was obtained and used for attenuation correction and anatomic localization. Fasting blood glucose: 126 mg/dl COMPARISON:  Chest CT 01/20/2022 and 01/20/2021 FINDINGS: Mediastinal blood pool activity: SUV max 2.4 NECK: No hypermetabolic cervical lymph nodes are identified.There are no lesions of the pharyngeal mucosal space. Incidental CT findings: Bilateral carotid atherosclerosis. CHEST: There are no hypermetabolic mediastinal, hilar or axillary lymph nodes. Cavitary right upper lobe nodule posterior to the right hilum measuring 8 x 8 mm on image 27/7 is intensely hypermetabolic with an SUV max 8.9. Enlarging  cavitary right lower lobe nodule measuring up to 12 mm on image 50/7 demonstrates mild hypermetabolic activity (SUV max 2.2). No other hypermetabolic pulmonary activity or suspicious nodularity. Additional scattered nodules are stable, including a solid 7 mm nodule in the right lower lobe on image 41/7. Incidental CT findings: Mild centrilobular and paraseptal  emphysema. Sequela of prior granulomatous disease with multiple calcified mediastinal and hilar lymph nodes and calcified right upper lobe granuloma. Diffuse atherosclerosis of the aorta, great vessels and coronary arteries. ABDOMEN/PELVIS: There is no hypermetabolic activity within the liver, adrenal glands, spleen or pancreas. There is no hypermetabolic nodal activity. Scattered bowel activity within physiologic limits. Incidental CT findings: Diffuse aortic and branch vessel atherosclerosis. Diverticular changes within the distal colon. SKELETON: There is no hypermetabolic activity to suggest osseous metastatic disease. Incidental CT findings: Mild spondylosis. IMPRESSION: 1. The new cavitary right upper lobe pulmonary nodule posterior to the right hilum shows intense hypermetabolic activity, consistent with bronchogenic carcinoma. 2. The increasingly solid and enlarging right lower lobe nodule is indeterminate, with low level hypermetabolic activity, but remains suspicious based on changes from previous studies. 3. No evidence of metastatic disease. 4. Coronary and aortic atherosclerosis (ICD10-I70.0). Emphysema (ICD10-J43.9). Electronically Signed: By: Richardean Sale M.D. On: 02/03/2022 08:49      IMPRESSION/PLAN: 1. 76 y.o. male with newly diagnosed Stage IA, NSCLC, adenocarcinoma of the RUL and RLL lung. Today, I talked to the patient and family about the findings and workup thus far. We discussed the natural history of non-small cell lung cancer and general treatment, highlighting the role of radiotherapy in the management. We discussed the  available radiation techniques, and focused on the details and logistics of delivery.  He is an excellent candidate for stereotactic body radiotherapy (SBRT) since he is adamantly not interested in surgical resection.  The recommendation is for a 3-5 fraction course of SBRT to the right upper lobe lung nodule and 3 fractions to the right lower lobe lung nodule.   We reviewed the anticipated acute and late sequelae associated with radiation in this setting. The patient was encouraged to ask questions that were answered to his stated satisfaction.  At the conclusion of our conversation, the patient is interested in proceeding with the recommended 3-5 fraction course of SBRT to the right upper lobe lung nodule and 3 fractions to the right lower lobe lung nodule.  He appears to have a good understanding of his disease and our treatment recommendations which are of curative intent.  I will share my discussion with Dr. Valeta Harms and proceed with coordinating CT simulation/treatment planning, in anticipation of beginning his treatments in the very near future.  I enjoyed meeting him today and look forward to continuing to participate in his care.   I personally spent 60 minutes in this encounter including chart review, reviewing radiological studies, meeting face-to-face with the patient, entering orders and completing documentation.   ------------------------------------------------   Tyler Pita, MD [place]: [phone]  Fax: [phone] [email or web address]  Skype  [email or web address]           n

## 2022-02-17 NOTE — Patient Instructions (Addendum)
It is good to see you today.  I am glad you have done well after your procedure. Please let us know if you change your mind about referral to surgery. We will do PFT's in the event you change your mind. You will see Dr. Tammi Klippel 02/22/2022.  He can give you information about radiation treatments.  We will place a referral to Medical oncology to be thorough. You will get a call to schedule this appointment. Call if you have any bleeding from your lungs.  Call to see [place] as needed. Please contact office for sooner follow up if symptoms do not improve or worsen or seek emergency care

## 2022-02-20 ENCOUNTER — Ambulatory Visit (INDEPENDENT_AMBULATORY_CARE_PROVIDER_SITE_OTHER): Payer: Medicare Other | Admitting: Pulmonary Disease

## 2022-02-20 ENCOUNTER — Encounter: Payer: Self-pay | Admitting: *Deleted

## 2022-02-20 DIAGNOSIS — C3411 Malignant neoplasm of upper lobe, right bronchus or lung: Secondary | ICD-10-CM | POA: Diagnosis not present

## 2022-02-20 DIAGNOSIS — C3491 Malignant neoplasm of unspecified part of right bronchus or lung: Secondary | ICD-10-CM

## 2022-02-20 LAB — PULMONARY FUNCTION TEST
DL/VA % pred: 101 %
DL/VA: 4 ml/min/mmHg/L
DLCO cor % pred: 70 %
DLCO cor: 18.22 ml/min/mmHg
DLCO unc % pred: 69 %
DLCO unc: 17.91 ml/min/mmHg
FEF 25-75 Post: 1.68 L/sec
FEF 25-75 Pre: 1.31 L/sec
FEF2575-%Change-Post: 28 %
FEF2575-%Pred-Post: 74 %
FEF2575-%Pred-Pre: 57 %
FEV1-%Change-Post: 13 %
FEV1-%Pred-Post: 72 %
FEV1-%Pred-Pre: 63 %
FEV1-Post: 2.29 L
FEV1-Pre: 2.01 L
FEV1FVC-%Change-Post: 8 %
FEV1FVC-%Pred-Pre: 88 %
FEV6-%Change-Post: 5 %
FEV6-%Pred-Post: 80 %
FEV6-%Pred-Pre: 76 %
FEV6-Post: 3.3 L
FEV6-Pre: 3.13 L
FEV6FVC-%Change-Post: 0 %
FEV6FVC-%Pred-Post: 106 %
FEV6FVC-%Pred-Pre: 106 %
FVC-%Change-Post: 4 %
FVC-%Pred-Post: 75 %
FVC-%Pred-Pre: 72 %
FVC-Post: 3.31 L
FVC-Pre: 3.16 L
Post FEV1/FVC ratio: 69 %
Post FEV6/FVC ratio: 100 %
Pre FEV1/FVC ratio: 64 %
Pre FEV6/FVC Ratio: 100 %

## 2022-02-20 NOTE — Patient Instructions (Signed)
Spirometry and diffusion capacity performed today. Patient was unable to keep frequency to perform Plethysmography lung volumes.

## 2022-02-20 NOTE — Progress Notes (Signed)
Spirometry and diffusion capacity performed today. Patient was unable to keep frequency to perform Plethysmography lung volumes.

## 2022-02-20 NOTE — Progress Notes (Signed)
I received referral on John Graham today. I updated new patient coordinator to call and schedule him to be seen on 7/20 with Dr. Julien Nordmann and labs.

## 2022-02-20 NOTE — Progress Notes (Signed)
Thoracic Location of Tumor / Histology: Malignant neoplasm of upper lobe of right lung  02/09/2022 Bronch Biopsies  A. LUNG, RLL NODULE, NEEDLE:  - Malignant cells are present with features consistent with  adenocarcinoma.   B. LUNG, RLL, BRUSHING:  - No malignant cells are identified   D. LUNG, RUL, NEEDLE:  - Atypical cells are present with features suggestive of non-small cell  carcinoma.  01/20/2022 Magdalen Spatz, NP CT Chest Lung Ca Screen Low Dose without Contrast CLINICAL DATA:  Sixty pack-year smoking history/current smoker  FINDINGS: Cardiovascular: Similar borderline ascending aortic dilatation/aneurysm at 4.0 cm on 27/2. Aortic atherosclerosis.  Normal heart size, without pericardial effusion. Left main and 3 vessel coronary artery calcification.   Mediastinum/Nodes: Calcified mediastinal and right hilar nodes are consistent with old granulomatous disease.   Lungs/Pleura: No pleural fluid. Mild centrilobular emphysema. A new posterior right upper lobe pulmonary nodule of volume derived equivalent diameter 14.9 mm on image 125.   Right lower lobe mixed attenuation nodule demonstrates increased solid component, including on image 227.   Other calcified and noncalcified pulmonary nodules are unchanged.   Upper Abdomen: Normal imaged portions of the liver, spleen, stomach, pancreas, gallbladder, adrenal glands, kidneys.   Musculoskeletal: Mid and lower thoracic spondylosis.   IMPRESSION: 1. Lung-RADS 4B, suspicious. Additional imaging evaluation or consultation with Pulmonology or Thoracic Surgery recommended. New right upper lobe solid and progressive right lower lobe mixed attenuation pulmonary nodules. Consider further evaluation with PET to direct biopsy. 2. No thoracic adenopathy. 3. Similar borderline ascending aortic dilatation/aneurysm. This can be re-evaluated on follow-up lung cancer screening CT. 4. Aortic atherosclerosis (ICD10-I70.0), coronary  artery atherosclerosis and emphysema (ICD10-J43.9).  02/01/2022 Magdalen Spatz, NP NM PET Image Skull Base To Thigh CLINICAL DATA:  Initial treatment strategy for new/enlarging right lung nodules on lung cancer screening chest CT.  FINDINGS: Mediastinal blood pool activity: SUV max 2.4   NECK: No hypermetabolic cervical lymph nodes are identified.There are no lesions of the pharyngeal mucosal space. Incidental CT findings: Bilateral carotid atherosclerosis.   CHEST: There are no hypermetabolic mediastinal, hilar or axillary lymph nodes. Cavitary right upper lobe nodule posterior to the right hilum measuring 8 x 8 mm on image 27/7 is intensely hypermetabolic with an SUV max 8.9. Enlarging cavitary right lower lobe nodule measuring up to 12 mm on image 50/7 demonstrates mild hypermetabolic activity (SUV max 2.2). No other hypermetabolic pulmonary activity or suspicious nodularity. Additional scattered nodules are stable, including a solid 7 mm nodule in the right lower lobe on image 41/7.   Incidental CT findings: Mild centrilobular and paraseptal emphysema.  Sequela of prior granulomatous disease with multiple calcified mediastinal and hilar lymph nodes and calcified right upper lobe granuloma. Diffuse atherosclerosis of the aorta, great vessels and coronary arteries.   ABDOMEN/PELVIS: There is no hypermetabolic activity within the liver, adrenal glands, spleen or pancreas. There is no hypermetabolic nodal activity. Scattered bowel activity within physiologic limits.   Incidental CT findings: Diffuse aortic and branch vessel atherosclerosis. Diverticular changes within the distal colon.   SKELETON: There is no hypermetabolic activity to suggest osseous metastatic disease. Incidental CT findings: Mild spondylosis.   IMPRESSION: 1. The new cavitary right upper lobe pulmonary nodule posterior to the right hilum shows intense hypermetabolic activity, consistent with bronchogenic  carcinoma. 2. The increasingly solid and enlarging right lower lobe nodule is indeterminate, with low level hypermetabolic activity, but remains suspicious based on changes from previous studies. 3. No evidence of metastatic disease. 4. Coronary and  aortic atherosclerosis (ICD10-I70.0). Emphysema (ICD10-J43.9).  ADDENDUM REPORT: 02/03/2022 11:36 An incidental finding not reported in the initial report is low level hypermetabolic activity at the gastroesophageal junction (SUV max 5.3). No corresponding anatomic abnormality on the CT images, and this finding is likely inflammatory or related to gastroesophageal reflux.  02/06/2022 Magdalen Spatz, NP CT Super D Chest without Contrast CLINICAL DATA:  Follow-up of right upper lobe pulmonary nodule. *Tracking Code: BO *  FINDINGS:  Cardiovascular: Aortic atherosclerosis. Tortuous thoracic aorta.  Normal heart size, without pericardial effusion. Left main and 3 vessel coronary artery calcification.   Mediastinum/Nodes: No supraclavicular adenopathy. Calcified mediastinal nodes are likely related to old granulomatous disease.  Hilar regions poorly evaluated without intravenous contrast.   Lungs/Pleura: No pleural fluid.  Mild centrilobular emphysema.   Right apical calcified granuloma laterally.   The partially cavitary, partially solid right lower lobe pulmonary nodule demonstrates a solid component of 11 mm on 111/3, similar to on the prior exam.   More anterior and superior right lower lobe pulmonary nodule measures 7 mm on 83/3 and is similar back to 01/20/2021.   The posterior right upper lobe nodule measures on the order of 1.1 x 1.1 cm on 59/3 and is similar to on the prior.   Upper Abdomen: Tiny low-density liver lesions are likely cysts. Old granulomatous disease in the spleen. Normal imaged portions of the stomach, pancreas, adrenal glands, kidneys.   Musculoskeletal: No acute osseous abnormality. Lower thoracic spondylosis.    IMPRESSION: 1. No change in a suspicious right upper and right lower lobe pulmonary nodules since the lung cancer screening CT of 01/20/2022. 2. No thoracic adenopathy. 3. Aortic atherosclerosis (ICD10-I70.0), coronary artery atherosclerosis and emphysema (ICD10-J43.9).  02/09/2022 Dr. Lewis Shock Chest Port 1 view CLINICAL DATA:  76 year old male status post bronchoscopic biopsies  FINDINGS:  Portable AP upright view at 0946 hours. Lung volumes and mediastinal contours are stable. Coarsely calcified right paratracheal lymph node again noted. Small right upper lobe calcified granuloma. Subtle patchy opacity at the medial right lung base. No pneumothorax, pulmonary edema, pleural effusion, or other confluent pulmonary opacity. Tracheal air column appears stable. No acute osseous abnormality identified.   IMPRESSION: Status post bronchoscopy. Subtle patchy opacity at the medial right lung base, such as due to BAL. No pneumothorax or other acute cardiopulmonary abnormality following bronchoscopic biopsies.  Tobacco/Marijuana/Snuff/ETOH use:   Past/Anticipated interventions by cardiothoracic surgery, if any:   Robotic Assisted Navigational Bronchoscopy  Past/Anticipated interventions by medical oncology, if any: NA  Signs/Symptoms Weight changes, if any: {:18581} Respiratory complaints, if any: {:18581} Hemoptysis, if any: {:18581} Pain issues, if any:  {:18581}  SAFETY ISSUES: Prior radiation? {:18581} Pacemaker/ICD? {:18581}  Possible current pregnancy?Male Is the patient on methotrexate? No  Current Complaints / other details:

## 2022-02-21 ENCOUNTER — Telehealth: Payer: Self-pay | Admitting: Internal Medicine

## 2022-02-21 NOTE — Telephone Encounter (Signed)
Scheduled appt per 7/7 referral. Pt's daughter is aware of appt date and time. Pt's daughter is aware to arrive 15 mins prior to appt time and to bring and updated insurance card. Pt's daughter is aware of appt location.

## 2022-02-22 ENCOUNTER — Other Ambulatory Visit: Payer: Self-pay

## 2022-02-22 ENCOUNTER — Ambulatory Visit
Admission: RE | Admit: 2022-02-22 | Discharge: 2022-02-22 | Disposition: A | Discharge status: Home / Self Care | Payer: Medicare Other | Source: Ambulatory Visit | Attending: Radiation Oncology | Admitting: Radiation Oncology

## 2022-02-22 DIAGNOSIS — C3411 Malignant neoplasm of upper lobe, right bronchus or lung: Secondary | ICD-10-CM | POA: Diagnosis not present

## 2022-02-22 DIAGNOSIS — I2510 Atherosclerotic heart disease of native coronary artery without angina pectoris: Secondary | ICD-10-CM | POA: Insufficient documentation

## 2022-02-22 DIAGNOSIS — I700 Atherosclerosis of aorta: Secondary | ICD-10-CM | POA: Insufficient documentation

## 2022-02-22 DIAGNOSIS — F17210 Nicotine dependence, cigarettes, uncomplicated: Secondary | ICD-10-CM | POA: Diagnosis not present

## 2022-02-22 DIAGNOSIS — M47814 Spondylosis without myelopathy or radiculopathy, thoracic region: Secondary | ICD-10-CM | POA: Diagnosis not present

## 2022-02-22 DIAGNOSIS — E785 Hyperlipidemia, unspecified: Secondary | ICD-10-CM | POA: Insufficient documentation

## 2022-02-22 DIAGNOSIS — J432 Centrilobular emphysema: Secondary | ICD-10-CM | POA: Insufficient documentation

## 2022-02-22 DIAGNOSIS — E7800 Pure hypercholesterolemia, unspecified: Secondary | ICD-10-CM | POA: Insufficient documentation

## 2022-02-22 DIAGNOSIS — C3431 Malignant neoplasm of lower lobe, right bronchus or lung: Secondary | ICD-10-CM | POA: Diagnosis not present

## 2022-02-22 DIAGNOSIS — C3432 Malignant neoplasm of lower lobe, left bronchus or lung: Secondary | ICD-10-CM | POA: Diagnosis not present

## 2022-02-22 DIAGNOSIS — C3491 Malignant neoplasm of unspecified part of right bronchus or lung: Secondary | ICD-10-CM

## 2022-02-22 DIAGNOSIS — Z79899 Other long term (current) drug therapy: Secondary | ICD-10-CM | POA: Diagnosis not present

## 2022-02-22 DIAGNOSIS — I10 Essential (primary) hypertension: Secondary | ICD-10-CM | POA: Diagnosis not present

## 2022-02-22 DIAGNOSIS — I6523 Occlusion and stenosis of bilateral carotid arteries: Secondary | ICD-10-CM | POA: Diagnosis not present

## 2022-02-22 DIAGNOSIS — Z800 Family history of malignant neoplasm of digestive organs: Secondary | ICD-10-CM | POA: Insufficient documentation

## 2022-02-24 ENCOUNTER — Ambulatory Visit: Payer: Medicare Other | Admitting: Radiation Oncology

## 2022-02-28 ENCOUNTER — Ambulatory Visit: Payer: Medicare Other | Admitting: Acute Care

## 2022-03-01 NOTE — Progress Notes (Signed)
  Radiation Oncology         ([phone]) [phone] ________________________________  Name: John Graham MRN: [id number]  Date: 03/02/2022  DOB: Jun 27, 1946  STEREOTACTIC BODY RADIOTHERAPY SIMULATION AND TREATMENT PLANNING NOTE    ICD-10-CM   1. Non-small cell cancer of right lung (HCC)  C34.91       DIAGNOSIS:  76 year old male with newly diagnosed Stage IA, NSCLC, adenocarcinoma of the RUL and RLL lung.  NARRATIVE:  The patient was brought to the [facility].  Identity was confirmed.  All relevant records and images related to the planned course of therapy were reviewed.  The patient freely provided informed written consent to proceed with treatment after reviewing the details related to the planned course of therapy. The consent form was witnessed and verified by the simulation staff.  Then, the patient was set-up in a stable reproducible  supine position for radiation therapy.  A BodyFix immobilization pillow was fabricated for reproducible positioning.  Then I personally applied the abdominal compression paddle to limit respiratory excursion.  4D respiratoy motion management CT images were obtained.  Surface markings were placed.  The CT images were loaded into the planning software.  Then, using Cine, MIP, and standard views, the internal target volume (ITV) and planning target volumes (PTV) were delinieated, and avoidance structures were contoured.  Treatment planning then occurred.  The radiation prescription was entered and confirmed.  A total of two complex treatment devices were fabricated in the form of the BodyFix immobilization pillow and a neck accuform cushion.  I have requested : 3D Simulation  I have requested a DVH of the following structures: Heart, Lungs, Esophagus, Chest Wall, Brachial Plexus, Major Blood Vessels, and targets.  SPECIAL TREATMENT PROCEDURE:  The planned course of therapy using radiation constitutes a special treatment procedure. Special care is required  in the management of this patient for the following reasons. This treatment constitutes a Special Treatment Procedure for the following reason: [ High dose per fraction requiring special monitoring for increased toxicities of treatment including daily imaging..  The special nature of the planned course of radiotherapy will require increased physician supervision and oversight to ensure patient's safety with optimal treatment outcomes.  This requires extended time and effort.    RESPIRATORY MOTION MANAGEMENT SIMULATION:  In order to account for effect of respiratory motion on target structures and other organs in the planning and delivery of radiotherapy, this patient underwent respiratory motion management simulation.  To accomplish this, when the patient was brought to the CT simulation planning suite, 4D respiratoy motion management CT images were obtained.  The CT images were loaded into the planning software.  Then, using a variety of tools including Cine, MIP, and standard views, the target volume and planning target volumes (PTV) were delineated.  Avoidance structures were contoured.  Treatment planning then occurred.  Dose volume histograms were generated and reviewed for each of the requested structure.  The resulting plan was carefully reviewed and approved today.  PLAN:  The patient will receive 54 Gy in 3 fractions to the RLL nodule and 60 Gy in 5 fxs to the RUL nodule given its more central location.  ________________________________  Sheral Apley Tammi Klippel, M.D.

## 2022-03-02 ENCOUNTER — Ambulatory Visit: Payer: Medicare Other | Admitting: Radiation Oncology

## 2022-03-02 ENCOUNTER — Inpatient Hospital Stay: Payer: Medicare Other | Attending: Internal Medicine | Admitting: Internal Medicine

## 2022-03-02 ENCOUNTER — Ambulatory Visit
Admission: RE | Admit: 2022-03-02 | Discharge: 2022-03-02 | Disposition: A | Discharge status: Home / Self Care | Payer: Medicare Other | Source: Ambulatory Visit | Attending: Radiation Oncology | Admitting: Radiation Oncology

## 2022-03-02 ENCOUNTER — Other Ambulatory Visit: Payer: Self-pay

## 2022-03-02 ENCOUNTER — Inpatient Hospital Stay: Payer: Medicare Other

## 2022-03-02 VITALS — BP 113/62 | HR 58 | Temp 97.5°F | Resp 17 | Wt 209.2 lb

## 2022-03-02 DIAGNOSIS — F17210 Nicotine dependence, cigarettes, uncomplicated: Secondary | ICD-10-CM | POA: Diagnosis not present

## 2022-03-02 DIAGNOSIS — C3431 Malignant neoplasm of lower lobe, right bronchus or lung: Secondary | ICD-10-CM | POA: Diagnosis not present

## 2022-03-02 DIAGNOSIS — C3411 Malignant neoplasm of upper lobe, right bronchus or lung: Secondary | ICD-10-CM | POA: Insufficient documentation

## 2022-03-02 DIAGNOSIS — C3491 Malignant neoplasm of unspecified part of right bronchus or lung: Secondary | ICD-10-CM

## 2022-03-02 DIAGNOSIS — C3490 Malignant neoplasm of unspecified part of unspecified bronchus or lung: Secondary | ICD-10-CM

## 2022-03-02 DIAGNOSIS — Z510 Encounter for antineoplastic radiation therapy: Secondary | ICD-10-CM | POA: Insufficient documentation

## 2022-03-02 LAB — CBC WITH DIFFERENTIAL (CANCER CENTER ONLY)
Abs Immature Granulocytes: 0.03 10*3/uL (ref 0.00–0.07)
Basophils Absolute: 0 10*3/uL (ref 0.0–0.1)
Basophils Relative: 0 %
Eosinophils Absolute: 0.1 10*3/uL (ref 0.0–0.5)
Eosinophils Relative: 1 %
HCT: 44.4 % (ref 39.0–52.0)
Hemoglobin: 15.2 g/dL (ref 13.0–17.0)
Immature Granulocytes: 0 %
Lymphocytes Relative: 14 %
Lymphs Abs: 1.2 10*3/uL (ref 0.7–4.0)
MCH: 33.8 pg (ref 26.0–34.0)
MCHC: 34.2 g/dL (ref 30.0–36.0)
MCV: 98.7 fL (ref 80.0–100.0)
Monocytes Absolute: 0.6 10*3/uL (ref 0.1–1.0)
Monocytes Relative: 7 %
Neutro Abs: 6.6 10*3/uL (ref 1.7–7.7)
Neutrophils Relative %: 78 %
Platelet Count: 148 10*3/uL — ABNORMAL LOW (ref 150–400)
RBC: 4.5 MIL/uL (ref 4.22–5.81)
RDW: 12.8 % (ref 11.5–15.5)
WBC Count: 8.5 10*3/uL (ref 4.0–10.5)
nRBC: 0 % (ref 0.0–0.2)

## 2022-03-02 LAB — CMP (CANCER CENTER ONLY)
ALT: 12 U/L (ref 0–44)
AST: 15 U/L (ref 15–41)
Albumin: 4.1 g/dL (ref 3.5–5.0)
Alkaline Phosphatase: 69 U/L (ref 38–126)
Anion gap: 3 — ABNORMAL LOW (ref 5–15)
BUN: 20 mg/dL (ref 8–23)
CO2: 30 mmol/L (ref 22–32)
Calcium: 9.6 mg/dL (ref 8.9–10.3)
Chloride: 107 mmol/L (ref 98–111)
Creatinine: 1.19 mg/dL (ref 0.61–1.24)
GFR, Estimated: >60 mL/min (ref >60)
Glucose, Bld: 120 mg/dL — ABNORMAL HIGH (ref 70–99)
Potassium: 4.8 mmol/L (ref 3.5–5.1)
Sodium: 140 mmol/L (ref 135–145)
Total Bilirubin: 0.4 mg/dL (ref 0.3–1.2)
Total Protein: 7 g/dL (ref 6.5–8.1)

## 2022-03-02 LAB — CULTURE, FUNGUS WITHOUT SMEAR

## 2022-03-02 NOTE — Progress Notes (Signed)
[place] [phone]   [phone]  CONSULT NOTE  REFERRING PHYSICIAN: Dr. Leory Plowman Icard  REASON FOR CONSULTATION:  76 years old white male recently diagnosed with lung cancer  HPI John Graham is a 76 y.o. male with past medical history significant for anxiety, PTSD, hypertension, dyslipidemia and hearing deficit.  The patient has a long history of smoking and he was followed by the lung screening program.  He had CT scan of the chest performed on 01/20/2022 that showed new posterior right upper lobe pulmonary nodule of volume drifted equivalent diameter of 14.9 mm.  There was also a right lower lobe mixed attenuation nodule with increased solid component.  Other calcified and noncalcified pulmonary nodules are unchanged.  There was no thoracic adenopathy.  The patient had a PET scan on 02/01/2022 and that showed the new cavitary right upper lobe pulmonary nodule posterior to the right hilum with intense hypermetabolic activity consistent with bronchogenic carcinoma.  The increasingly solid and enlarging right lower lobe nodule is intermediate with low-level hypermetabolic activity and remains suspicious based on changes from the previous study.  There was no evidence of metastatic disease.  On 02/09/2022 the patient underwent flexible video fiberoptic bronchoscopy with robotic assistance and biopsies under the care of Dr. Valeta Harms.  The final pathology ([id number]) of the right lower lobe nodule showed malignant cells with features consistent with adenocarcinoma.  Fine-needle aspiration of the right upper lobe nodule showed atypical cells present with features suggestive of non-small cell carcinoma.  The patient was referred to me and also to Dr. Tammi Klippel for management of his condition. When seen today the patient is feeling fine with no concerning complaints except for shortness of breath with exertion but no significant chest pain, cough or hemoptysis.  He has  no nausea, vomiting, diarrhea or constipation.  He denied having any headache or visual changes.  He has no recent weight loss or night sweats. Family history significant for mother with pancreatic cancer and father with questionable lymphoma.  The patient is married and has 1 daughter and who accompanied him to the visit today.  He used to work as a IT sales professional.  He has a history of smoking 1 pack/day for around 60 years and unfortunately continues to smoke.  He also smokes marijuana.  He has no history of alcohol or drug abuse.  HPI  Past Medical History:  Diagnosis Date   Anxiety    PTSD   HOH (hard of hearing)    no hearing aids   Hypercholesteremia    Hyperlipidemia    Hypertension     Past Surgical History:  Procedure Laterality Date   BRONCHIAL BIOPSY  02/09/2022   Procedure: BRONCHIAL BIOPSIES;  Surgeon: [name], DO;  Location: [facility];  Service: Pulmonary;;   BRONCHIAL BRUSHINGS  02/09/2022   Procedure: BRONCHIAL BRUSHINGS;  Surgeon: [name], DO;  Location: [facility];  Service: Pulmonary;;   BRONCHIAL NEEDLE ASPIRATION BIOPSY  02/09/2022   Procedure: BRONCHIAL NEEDLE ASPIRATION BIOPSIES;  Surgeon: [name], DO;  Location: [facility];  Service: Pulmonary;;   COLONOSCOPY     DENTAL SURGERY     8 dental implants   POLYPECTOMY     VIDEO BRONCHOSCOPY WITH ENDOBRONCHIAL ULTRASOUND Bilateral 02/09/2022   Procedure: VIDEO BRONCHOSCOPY WITH ENDOBRONCHIAL ULTRASOUND;  Surgeon: [name], DO;  Location: [facility];  Service: Pulmonary;  Laterality: Bilateral;   VIDEO BRONCHOSCOPY WITH RADIAL ENDOBRONCHIAL ULTRASOUND  02/09/2022  Procedure: VIDEO BRONCHOSCOPY WITH RADIAL ENDOBRONCHIAL ULTRASOUND;  Surgeon: [name], DO;  Location: [facility];  Service: Pulmonary;;    Family History  Problem Relation Age of Onset   Pancreatic cancer Mother    Cancer Father        Lymph Nodes   Colon cancer Neg Hx     Social  History Social History   Tobacco Use   Smoking status: Every Day    Packs/day: 1.00    Years: 53.00    Total pack years: 53.00    Types: Cigarettes   Smokeless tobacco: Never   Tobacco comments:    20 cigarettes smoked daily 02/17/2022 hfb  Vaping Use   Vaping Use: Never used  Substance Use Topics   Alcohol use: No   Drug use: No    No Known Allergies  Current Outpatient Medications  Medication Sig Dispense Refill   metoprolol tartrate (LOPRESSOR) 25 MG tablet TAKE 1 TABLET (25 MG TOTAL) BY MOUTH 2 (TWO) TIMES DAILY. *APPOINTMENT REQUIRED FOR FUTURE REFILLS* 180 tablet 0   Multiple Minerals-Vitamins (CAL MAG ZINC +D3 PO) Take 1 tablet by mouth every evening.     Multiple Vitamin (MULTIVITAMIN WITH MINERALS) TABS tablet Take 1 tablet by mouth in the morning. Centrum     sertraline (ZOLOFT) 50 MG tablet TAKE 1 TABLET BY MOUTH EVERY DAY 90 tablet 3   simvastatin (ZOCOR) 40 MG tablet TAKE 1 TABLET BY MOUTH EVERYDAY AT BEDTIME 90 tablet 3   Current Facility-Administered Medications  Medication Dose Route Frequency Provider Last Rate Last Admin   0.9 %  sodium chloride infusion  500 mL Intravenous Once Irene Shipper, MD        Review of Systems Constitutional: positive for fatigue Eyes: negative Ears, nose, mouth, throat, and face: negative Respiratory: positive for dyspnea on exertion Cardiovascular: negative Gastrointestinal: negative Genitourinary:negative Integument/breast: negative Hematologic/lymphatic: negative Musculoskeletal:negative Neurological: negative Behavioral/Psych: negative Endocrine: negative Allergic/Immunologic: negative   Physical Exam  VWU:JWJXB, healthy, no distress, well nourished, and well developed SKIN: skin color, texture, turgor are normal, no rashes or significant lesions HEAD: Normocephalic, No masses, lesions, tenderness or abnormalities EYES: normal, PERRLA, Conjunctiva are pink and non-injected EARS: External ears normal, Canals  clear OROPHARYNX:no exudate, no erythema, and lips, buccal mucosa, and tongue normal  NECK: supple, no adenopathy, no JVD LYMPH:  no palpable lymphadenopathy, no hepatosplenomegaly LUNGS: clear to auscultation , and palpation HEART: regular rate & rhythm, no murmurs, and no gallops ABDOMEN:abdomen soft, non-tender, obese, normal bowel sounds, and no masses or organomegaly BACK: Back symmetric, no curvature., No CVA tenderness EXTREMITIES:no joint deformities, effusion, or inflammation, no edema  NEURO: alert & oriented x 3 with fluent speech, no focal motor/sensory deficits  PERFORMANCE STATUS: ECOG 1  LABORATORY DATA: Lab Results  Component Value Date   WBC 7.5 02/09/2022   HGB 14.0 02/09/2022   HCT 41.7 02/09/2022   MCV 99.8 02/09/2022   PLT 141 (L) 02/09/2022      Chemistry      Component Value Date/Time   NA 144 02/09/2022 0637   K 4.2 02/09/2022 0637   CL 105 02/09/2022 0637   CO2 27 02/09/2022 0637   BUN 14 02/09/2022 0637   CREATININE 1.16 02/09/2022 0637   CREATININE 1.07 06/28/2020 1542      Component Value Date/Time   CALCIUM 9.1 02/09/2022 0637   ALKPHOS 81 12/27/2021 1444   AST 18 12/27/2021 1444   ALT 14 12/27/2021 1444   BILITOT 0.7 12/27/2021 1444  RADIOGRAPHIC STUDIES: DG CHEST PORT 1 VIEW  Result Date: 02/09/2022 CLINICAL DATA:  76 year old male status post bronchoscopic biopsies. EXAM: PORTABLE CHEST 1 VIEW COMPARISON:  Chest CT 02/06/2022. FINDINGS: Portable AP upright view at 0946 hours. Lung volumes and mediastinal contours are stable. Coarsely calcified right paratracheal lymph node again noted. Small right upper lobe calcified granuloma. Subtle patchy opacity at the medial right lung base. No pneumothorax, pulmonary edema, pleural effusion, or other confluent pulmonary opacity. Tracheal air column appears stable. No acute osseous abnormality identified. IMPRESSION: Status post bronchoscopy. Subtle patchy opacity at the medial right lung base,  such as due to BAL. No pneumothorax or other acute cardiopulmonary abnormality following bronchoscopic biopsies. Electronically Signed   By: Genevie Ann M.D.   On: 02/09/2022 09:58   DG C-ARM BRONCHOSCOPY  Result Date: 02/09/2022 C-ARM BRONCHOSCOPY: Fluoroscopy was utilized by the requesting physician.  No radiographic interpretation.   DG C-Arm 1-60 Min-No Report  Result Date: 02/09/2022 Fluoroscopy was utilized by the requesting physician.  No radiographic interpretation.   CT Super D Chest Wo Contrast  Result Date: 02/06/2022 CLINICAL DATA:  Follow-up of right upper lobe pulmonary nodule. * Tracking Code: BO * EXAM: CT CHEST WITHOUT CONTRAST TECHNIQUE: Multidetector CT imaging of the chest was performed using thin slice collimation for electromagnetic bronchoscopy planning purposes, without intravenous contrast. RADIATION DOSE REDUCTION: This exam was performed according to the departmental dose-optimization program which includes automated exposure control, adjustment of the mA and/or kV according to patient size and/or use of iterative reconstruction technique. COMPARISON:  PET of 02/01/2022. Lung cancer screening CT 01/20/2022. FINDINGS: Cardiovascular: Aortic atherosclerosis. Tortuous thoracic aorta. Normal heart size, without pericardial effusion. Left main and 3 vessel coronary artery calcification. Mediastinum/Nodes: No supraclavicular adenopathy. Calcified mediastinal nodes are likely related to old granulomatous disease. Hilar regions poorly evaluated without intravenous contrast. Lungs/Pleura: No pleural fluid.  Mild centrilobular emphysema. Right apical calcified granuloma laterally. The partially cavitary, partially solid right lower lobe pulmonary nodule demonstrates a solid component of 11 mm on 111/3, similar to on the prior exam. More anterior and superior right lower lobe pulmonary nodule measures 7 mm on 83/3 and is similar back to 01/20/2021. The posterior right upper lobe nodule  measures on the order of 1.1 x 1.1 cm on 59/3 and is similar to on the prior. Upper Abdomen: Tiny low-density liver lesions are likely cysts. Old granulomatous disease in the spleen. Normal imaged portions of the stomach, pancreas, adrenal glands, kidneys. Musculoskeletal: No acute osseous abnormality. Lower thoracic spondylosis. IMPRESSION: 1. No change in a suspicious right upper and right lower lobe pulmonary nodules since the lung cancer screening CT of 01/20/2022. 2. No thoracic adenopathy. 3. Aortic atherosclerosis (ICD10-I70.0), coronary artery atherosclerosis and emphysema (ICD10-J43.9). Electronically Signed   By: Abigail Miyamoto M.D.   On: 02/06/2022 15:23   NM PET Image Initial (PI) Skull Base To Thigh  Addendum Date: 02/03/2022   ADDENDUM REPORT: 02/03/2022 11:36 ADDENDUM: An incidental finding not reported in the initial report is low level hypermetabolic activity at the gastroesophageal junction (SUV max 5.3). No corresponding anatomic abnormality on the CT images, and this finding is likely inflammatory or related to gastroesophageal reflux. Electronically Signed   By: Richardean Sale M.D.   On: 02/03/2022 11:36   Result Date: 02/03/2022 CLINICAL DATA:  Initial treatment strategy for new/enlarging right lung nodules on lung cancer screening chest CT. EXAM: NUCLEAR MEDICINE PET SKULL BASE TO THIGH TECHNIQUE: 10.4 mCi F-18 FDG was injected intravenously. Full-ring PET imaging was  performed from the skull base to thigh after the radiotracer. CT data was obtained and used for attenuation correction and anatomic localization. Fasting blood glucose: 126 mg/dl COMPARISON:  Chest CT 01/20/2022 and 01/20/2021 FINDINGS: Mediastinal blood pool activity: SUV max 2.4 NECK: No hypermetabolic cervical lymph nodes are identified.There are no lesions of the pharyngeal mucosal space. Incidental CT findings: Bilateral carotid atherosclerosis. CHEST: There are no hypermetabolic mediastinal, hilar or axillary lymph  nodes. Cavitary right upper lobe nodule posterior to the right hilum measuring 8 x 8 mm on image 27/7 is intensely hypermetabolic with an SUV max 8.9. Enlarging cavitary right lower lobe nodule measuring up to 12 mm on image 50/7 demonstrates mild hypermetabolic activity (SUV max 2.2). No other hypermetabolic pulmonary activity or suspicious nodularity. Additional scattered nodules are stable, including a solid 7 mm nodule in the right lower lobe on image 41/7. Incidental CT findings: Mild centrilobular and paraseptal emphysema. Sequela of prior granulomatous disease with multiple calcified mediastinal and hilar lymph nodes and calcified right upper lobe granuloma. Diffuse atherosclerosis of the aorta, great vessels and coronary arteries. ABDOMEN/PELVIS: There is no hypermetabolic activity within the liver, adrenal glands, spleen or pancreas. There is no hypermetabolic nodal activity. Scattered bowel activity within physiologic limits. Incidental CT findings: Diffuse aortic and branch vessel atherosclerosis. Diverticular changes within the distal colon. SKELETON: There is no hypermetabolic activity to suggest osseous metastatic disease. Incidental CT findings: Mild spondylosis. IMPRESSION: 1. The new cavitary right upper lobe pulmonary nodule posterior to the right hilum shows intense hypermetabolic activity, consistent with bronchogenic carcinoma. 2. The increasingly solid and enlarging right lower lobe nodule is indeterminate, with low level hypermetabolic activity, but remains suspicious based on changes from previous studies. 3. No evidence of metastatic disease. 4. Coronary and aortic atherosclerosis (ICD10-I70.0). Emphysema (ICD10-J43.9). Electronically Signed: By: Richardean Sale M.D. On: 02/03/2022 08:49    ASSESSMENT: This is a very pleasant 76 years old white male with synchronous stage Ia (T1b, N0, M0) non-small cell lung cancer presenting with right upper lobe lung nodule in addition to right lower  lobe lung nodule diagnosed in June 2023.   PLAN: I had a lengthy discussion with the patient and his daughter today about his current disease stage, prognosis and treatment options. I personally and independently reviewed the scan images and discussed results with the patient and his daughter. The patient is not a great surgical candidate for surgical resection because of his pulmonary function and long history of smoking. He was seen by Dr. Tammi Klippel and expected to start SBRT to the 2 pulmonary nodule in the right lung soon. I recommended for the patient to proceed with this treatment as planned as an a curative option. I do not see a need to consider this patient for any adjuvant systemic chemotherapy at this point but we will continue to monitor him closely. I will arrange for the patient to come back for follow-up visit in around 4 months for evaluation and repeat imaging studies with CT scan of the chest. If the patient has any evidence for disease progression, we will consider sending his tissue biopsy for molecular studies and PD-L1 expression. The patient was strongly advised to quit smoking. The patient and his daughter agreed to the current plan. He was advised to call immediately if he has any other concerning symptoms in the interval. The patient voices understanding of current disease status and treatment options and is in agreement with the current care plan.  All questions were answered. The patient knows  to call the clinic with any problems, questions or concerns. We can certainly see the patient much sooner if necessary.  Thank you so much for allowing me to participate in the care of BRYN PERKIN. I will continue to follow up the patient with you and assist in his care.  The total time spent in the appointment was 60 minutes.  Disclaimer: This note was dictated with voice recognition software. Similar sounding words can inadvertently be transcribed and may not be corrected  upon review.   Eilleen Kempf March 02, 2022, 11:11 AM

## 2022-03-07 DIAGNOSIS — F17210 Nicotine dependence, cigarettes, uncomplicated: Secondary | ICD-10-CM | POA: Diagnosis not present

## 2022-03-07 DIAGNOSIS — C3431 Malignant neoplasm of lower lobe, right bronchus or lung: Secondary | ICD-10-CM | POA: Diagnosis not present

## 2022-03-09 DIAGNOSIS — C3431 Malignant neoplasm of lower lobe, right bronchus or lung: Secondary | ICD-10-CM | POA: Diagnosis not present

## 2022-03-09 DIAGNOSIS — Z510 Encounter for antineoplastic radiation therapy: Secondary | ICD-10-CM | POA: Insufficient documentation

## 2022-03-09 DIAGNOSIS — F17210 Nicotine dependence, cigarettes, uncomplicated: Secondary | ICD-10-CM | POA: Diagnosis not present

## 2022-03-09 DIAGNOSIS — C3411 Malignant neoplasm of upper lobe, right bronchus or lung: Secondary | ICD-10-CM | POA: Diagnosis not present

## 2022-03-10 ENCOUNTER — Ambulatory Visit: Payer: Medicare Other | Admitting: Radiation Oncology

## 2022-03-10 ENCOUNTER — Ambulatory Visit: Payer: Medicare Other

## 2022-03-13 ENCOUNTER — Ambulatory Visit: Payer: Medicare Other | Admitting: Radiation Oncology

## 2022-03-13 DIAGNOSIS — F17210 Nicotine dependence, cigarettes, uncomplicated: Secondary | ICD-10-CM | POA: Diagnosis not present

## 2022-03-13 DIAGNOSIS — C3411 Malignant neoplasm of upper lobe, right bronchus or lung: Secondary | ICD-10-CM | POA: Diagnosis not present

## 2022-03-13 DIAGNOSIS — Z510 Encounter for antineoplastic radiation therapy: Secondary | ICD-10-CM | POA: Diagnosis not present

## 2022-03-14 ENCOUNTER — Ambulatory Visit: Payer: Medicare Other | Admitting: Radiation Oncology

## 2022-03-14 DIAGNOSIS — C3411 Malignant neoplasm of upper lobe, right bronchus or lung: Secondary | ICD-10-CM | POA: Diagnosis not present

## 2022-03-14 DIAGNOSIS — F17210 Nicotine dependence, cigarettes, uncomplicated: Secondary | ICD-10-CM | POA: Diagnosis not present

## 2022-03-15 ENCOUNTER — Other Ambulatory Visit: Payer: Self-pay

## 2022-03-15 ENCOUNTER — Ambulatory Visit
Admission: RE | Admit: 2022-03-15 | Discharge: 2022-03-15 | Disposition: A | Discharge status: Home / Self Care | Payer: Medicare Other | Source: Ambulatory Visit | Attending: Radiation Oncology | Admitting: Radiation Oncology

## 2022-03-15 DIAGNOSIS — C3491 Malignant neoplasm of unspecified part of right bronchus or lung: Secondary | ICD-10-CM | POA: Insufficient documentation

## 2022-03-15 LAB — RAD ONC ARIA SESSION SUMMARY
Course Elapsed Days: 0
Plan Fractions Treated to Date: 1
Plan Fractions Treated to Date: 1
Plan Prescribed Dose Per Fraction: 10 Gy
Plan Prescribed Dose Per Fraction: 18 Gy
Plan Total Fractions Prescribed: 3
Plan Total Fractions Prescribed: 5
Plan Total Prescribed Dose: 50 Gy
Plan Total Prescribed Dose: 54 Gy
Reference Point Dosage Given to Date: 10 Gy
Reference Point Dosage Given to Date: 18 Gy
Reference Point Session Dosage Given: 10 Gy
Reference Point Session Dosage Given: 18 Gy
Session Number: 1

## 2022-03-16 ENCOUNTER — Ambulatory Visit: Payer: Medicare Other | Admitting: Radiation Oncology

## 2022-03-17 ENCOUNTER — Other Ambulatory Visit: Payer: Self-pay

## 2022-03-17 ENCOUNTER — Ambulatory Visit
Admission: RE | Admit: 2022-03-17 | Discharge: 2022-03-17 | Disposition: A | Discharge status: Home / Self Care | Payer: Medicare Other | Source: Ambulatory Visit | Attending: Radiation Oncology | Admitting: Radiation Oncology

## 2022-03-17 DIAGNOSIS — C3491 Malignant neoplasm of unspecified part of right bronchus or lung: Secondary | ICD-10-CM | POA: Diagnosis not present

## 2022-03-17 LAB — RAD ONC ARIA SESSION SUMMARY
Course Elapsed Days: 2
Plan Fractions Treated to Date: 2
Plan Fractions Treated to Date: 2
Plan Prescribed Dose Per Fraction: 10 Gy
Plan Prescribed Dose Per Fraction: 18 Gy
Plan Total Fractions Prescribed: 3
Plan Total Fractions Prescribed: 5
Plan Total Prescribed Dose: 50 Gy
Plan Total Prescribed Dose: 54 Gy
Reference Point Dosage Given to Date: 20 Gy
Reference Point Dosage Given to Date: 36 Gy
Reference Point Session Dosage Given: 10 Gy
Reference Point Session Dosage Given: 18 Gy
Session Number: 2

## 2022-03-20 ENCOUNTER — Other Ambulatory Visit: Payer: Self-pay

## 2022-03-20 ENCOUNTER — Ambulatory Visit
Admission: RE | Admit: 2022-03-20 | Discharge: 2022-03-20 | Disposition: A | Discharge status: Home / Self Care | Payer: Medicare Other | Source: Ambulatory Visit | Attending: Radiation Oncology | Admitting: Radiation Oncology

## 2022-03-20 DIAGNOSIS — C3491 Malignant neoplasm of unspecified part of right bronchus or lung: Secondary | ICD-10-CM | POA: Diagnosis not present

## 2022-03-20 LAB — RAD ONC ARIA SESSION SUMMARY
Course Elapsed Days: 5
Plan Fractions Treated to Date: 3
Plan Fractions Treated to Date: 3
Plan Prescribed Dose Per Fraction: 10 Gy
Plan Prescribed Dose Per Fraction: 18 Gy
Plan Total Fractions Prescribed: 3
Plan Total Fractions Prescribed: 5
Plan Total Prescribed Dose: 50 Gy
Plan Total Prescribed Dose: 54 Gy
Reference Point Dosage Given to Date: 30 Gy
Reference Point Dosage Given to Date: 54 Gy
Reference Point Session Dosage Given: 10 Gy
Reference Point Session Dosage Given: 18 Gy
Session Number: 3

## 2022-03-21 ENCOUNTER — Ambulatory Visit: Payer: Medicare Other

## 2022-03-22 ENCOUNTER — Ambulatory Visit
Admission: RE | Admit: 2022-03-22 | Discharge: 2022-03-22 | Disposition: A | Discharge status: Home / Self Care | Payer: Medicare Other | Source: Ambulatory Visit | Attending: Radiation Oncology | Admitting: Radiation Oncology

## 2022-03-22 ENCOUNTER — Other Ambulatory Visit: Payer: Self-pay

## 2022-03-22 DIAGNOSIS — C3491 Malignant neoplasm of unspecified part of right bronchus or lung: Secondary | ICD-10-CM | POA: Diagnosis not present

## 2022-03-22 LAB — RAD ONC ARIA SESSION SUMMARY
Course Elapsed Days: 7
Plan Fractions Treated to Date: 4
Plan Prescribed Dose Per Fraction: 10 Gy
Plan Total Fractions Prescribed: 5
Plan Total Prescribed Dose: 50 Gy
Reference Point Dosage Given to Date: 40 Gy
Reference Point Session Dosage Given: 10 Gy
Session Number: 4

## 2022-03-23 ENCOUNTER — Ambulatory Visit: Payer: Medicare Other

## 2022-03-24 ENCOUNTER — Other Ambulatory Visit: Payer: Self-pay

## 2022-03-24 ENCOUNTER — Encounter: Payer: Self-pay | Admitting: Urology

## 2022-03-24 ENCOUNTER — Ambulatory Visit
Admission: RE | Admit: 2022-03-24 | Discharge: 2022-03-24 | Disposition: A | Discharge status: Home / Self Care | Payer: Medicare Other | Source: Ambulatory Visit | Attending: Radiation Oncology | Admitting: Radiation Oncology

## 2022-03-24 DIAGNOSIS — C3491 Malignant neoplasm of unspecified part of right bronchus or lung: Secondary | ICD-10-CM

## 2022-03-24 DIAGNOSIS — C3411 Malignant neoplasm of upper lobe, right bronchus or lung: Secondary | ICD-10-CM | POA: Diagnosis not present

## 2022-03-24 DIAGNOSIS — Z510 Encounter for antineoplastic radiation therapy: Secondary | ICD-10-CM | POA: Diagnosis not present

## 2022-03-24 DIAGNOSIS — F17210 Nicotine dependence, cigarettes, uncomplicated: Secondary | ICD-10-CM | POA: Diagnosis not present

## 2022-03-24 DIAGNOSIS — C3431 Malignant neoplasm of lower lobe, right bronchus or lung: Secondary | ICD-10-CM | POA: Diagnosis not present

## 2022-03-24 LAB — RAD ONC ARIA SESSION SUMMARY
Course Elapsed Days: 9
Plan Fractions Treated to Date: 5
Plan Prescribed Dose Per Fraction: 10 Gy
Plan Total Fractions Prescribed: 5
Plan Total Prescribed Dose: 50 Gy
Reference Point Dosage Given to Date: 50 Gy
Reference Point Session Dosage Given: 10 Gy
Session Number: 5

## 2022-03-24 LAB — ACID FAST CULTURE WITH REFLEXED SENSITIVITIES (MYCOBACTERIA): Acid Fast Culture: NEGATIVE

## 2022-04-11 ENCOUNTER — Encounter: Payer: Self-pay | Admitting: Internal Medicine

## 2022-04-15 ENCOUNTER — Other Ambulatory Visit: Payer: Self-pay | Admitting: Family Medicine

## 2022-04-26 ENCOUNTER — Encounter: Payer: Self-pay | Admitting: Urology

## 2022-04-26 NOTE — Progress Notes (Signed)
Radiation Oncology         (336) [phone] ________________________________  Name: John Graham MRN: [id number]  Date: 04/27/2022  DOB: [birth year]  Post Treatment Note  CC: [name], MD  [name], DO  Diagnosis:   76 year old male with Stage IA, NSCLC, adenocarcinoma of the RUL and RLL lung.  Interval Since Last Radiation:  4.5 weeks  03/15/22 - 03/20/22//SBRT: The target in the RLL lung was treated to 54 Gy in 3 fractions of 18 Gy 03/15/22 - 03/24/22//SBRT: The target in the RUL lung was treated to 60 Gy in 5 fractions of 12 Gy  Narrative:  I spoke with the patient to conduct his routine scheduled 1 month follow up visit via telephone to spare the patient unnecessary potential exposure in the healthcare setting during the current COVID-19 pandemic.  The patient was notified in advance and gave permission to proceed with this visit format.  He tolerated radiation treatment relatively well with only mild fatigue.                              On review of systems, the patient states that he is doing well in general and without complaints. He specifically denies dysphagia, productive cough, hemoptysis, chest pain, shortness of breath, fever, chills or dyspnea. He reports a healthy appetite and is maintaining his weight. He has not noticed any change in his energy level and overall, is quite pleased with his progress to date.  ALLERGIES:  has No Known Allergies.  Meds: Current Outpatient Medications  Medication Sig Dispense Refill   metoprolol tartrate (LOPRESSOR) 25 MG tablet TAKE 1 TABLET BY MOUTH 2 (TWO) TIMES DAILY. *APPOINTMENT REQUIRED FOR FUTURE REFILLS* 180 tablet 0   Multiple Minerals-Vitamins (CAL MAG ZINC +D3 PO) Take 1 tablet by mouth every evening.     Multiple Vitamin (MULTIVITAMIN WITH MINERALS) TABS tablet Take 1 tablet by mouth in the morning. Centrum     sertraline (ZOLOFT) 50 MG tablet TAKE 1 TABLET BY MOUTH EVERY DAY 90 tablet 3   simvastatin (ZOCOR) 40 MG  tablet TAKE 1 TABLET BY MOUTH EVERYDAY AT BEDTIME 90 tablet 3   Current Facility-Administered Medications  Medication Dose Route Frequency Provider Last Rate Last Admin   0.9 %  sodium chloride infusion  500 mL Intravenous Once Irene Shipper, MD        Physical Findings:  vitals were not taken for this visit.  Pain Assessment Pain Score: 0-No pain/10 Unable to assess due to telephone follow up visit format.  Lab Findings: Lab Results  Component Value Date   WBC 8.5 03/02/2022   HGB 15.2 03/02/2022   HCT 44.4 03/02/2022   MCV 98.7 03/02/2022   PLT 148 (L) 03/02/2022     Radiographic Findings: No results found.  Impression/Plan: 79. 76 year old male with Stage IA, NSCLC, adenocarcinoma of the RUL and RLL lung. He appears to have recovered well from the effects of his recent SBRT lung treatment and is currently without complaints.  We discussed that while we are happy to continue to participate in his care if clinically indicated, at this point, we will plan to see him back on an as-needed basis.  He will continue in routine follow-up under the care and direction of Dr. Earlie Server for continued management of his systemic disease.  He will have a posttreatment CT chest scan prior to his upcoming follow-up visit with Dr. Earlie Server on 07/10/2022.  We  have enjoyed taking care of him and look forward to following his progress via correspondence.  He knows that he is welcome to call at anytime with any questions or concerns related to his radiation.   [name], PA-C

## 2022-04-26 NOTE — Progress Notes (Addendum)
  Radiation Oncology         [phone]) [phone] ________________________________  Name: John Graham MRN: [id number]  Date: 03/24/2022  DOB: 1945/10/08  End of Treatment Note  Diagnosis:   76 year old male with Stage IA, NSCLC, adenocarcinoma of the RUL and RLL lung.     Indication for treatment:  Curative, Definitive SBRT       Radiation treatment dates:    03/15/22 - 03/20/22 03/15/22 - 03/24/22  Site/dose:    The target in the RLL lung was treated to 54 Gy in 3 fractions of 18 Gy The target in the RUL lung was treated to 50 Gy in 5 fractions of 10 Gy  Beams/energy:   The patient was treated using stereotactic body radiotherapy according to a 3D conformal radiotherapy plan.  Volumetric arc fields were employed to deliver 6 MV X-rays.  Image guidance was performed with per fraction cone beam CT prior to treatment under personal MD supervision.  Immobilization was achieved using BodyFix Pillow.  Narrative: The patient tolerated radiation treatment relatively well with only mild fatigue.  Plan: The patient has completed radiation treatment. The patient will return to radiation oncology clinic for routine followup in one month. I advised them to call or return sooner if they have any questions or concerns related to their recovery or treatment. ________________________________  Sheral Apley. Tammi Klippel, M.D.

## 2022-04-26 NOTE — Progress Notes (Signed)
Telephone appointment. I verified patient's identity and began nursing interview. Patient doing well. No issues reported at this time.  Meaningful use complete.  Reminded patient of his 9:00am-04/27/22 telephone appointment w/ Ashlyn Bruning PA-C. I left my extension [phone] in case patient needs anything. Patient verbalized understanding.  Patient contact [phone]

## 2022-04-27 ENCOUNTER — Ambulatory Visit
Admission: RE | Admit: 2022-04-27 | Discharge: 2022-04-27 | Disposition: A | Discharge status: Home / Self Care | Payer: Medicare Other | Source: Ambulatory Visit | Attending: Urology | Admitting: Urology

## 2022-04-27 DIAGNOSIS — C3491 Malignant neoplasm of unspecified part of right bronchus or lung: Secondary | ICD-10-CM

## 2022-05-18 ENCOUNTER — Ambulatory Visit: Payer: Medicare Other | Admitting: Neurology

## 2022-05-23 ENCOUNTER — Ambulatory Visit: Payer: Medicare Other | Admitting: Internal Medicine

## 2022-06-06 ENCOUNTER — Telehealth: Payer: Medicare Other

## 2022-06-23 ENCOUNTER — Telehealth: Payer: Self-pay | Admitting: Internal Medicine

## 2022-06-23 NOTE — Telephone Encounter (Signed)
Called patient regarding rescheduled 11/27 appointment to 11/20 per provider pal, patient has been called and voicemail was left.

## 2022-06-29 ENCOUNTER — Ambulatory Visit (HOSPITAL_COMMUNITY)
Admission: RE | Admit: 2022-06-29 | Discharge: 2022-06-29 | Disposition: A | Discharge status: Home / Self Care | Payer: Medicare Other | Source: Ambulatory Visit | Attending: Internal Medicine | Admitting: Internal Medicine

## 2022-06-29 ENCOUNTER — Inpatient Hospital Stay: Payer: Medicare Other | Attending: Internal Medicine

## 2022-06-29 DIAGNOSIS — I700 Atherosclerosis of aorta: Secondary | ICD-10-CM | POA: Insufficient documentation

## 2022-06-29 DIAGNOSIS — I10 Essential (primary) hypertension: Secondary | ICD-10-CM | POA: Insufficient documentation

## 2022-06-29 DIAGNOSIS — E7800 Pure hypercholesterolemia, unspecified: Secondary | ICD-10-CM | POA: Insufficient documentation

## 2022-06-29 DIAGNOSIS — Z79899 Other long term (current) drug therapy: Secondary | ICD-10-CM | POA: Insufficient documentation

## 2022-06-29 DIAGNOSIS — C3432 Malignant neoplasm of lower lobe, left bronchus or lung: Secondary | ICD-10-CM | POA: Insufficient documentation

## 2022-06-29 DIAGNOSIS — C3490 Malignant neoplasm of unspecified part of unspecified bronchus or lung: Secondary | ICD-10-CM

## 2022-06-29 DIAGNOSIS — E785 Hyperlipidemia, unspecified: Secondary | ICD-10-CM | POA: Insufficient documentation

## 2022-06-29 DIAGNOSIS — R911 Solitary pulmonary nodule: Secondary | ICD-10-CM | POA: Diagnosis not present

## 2022-06-29 LAB — CMP (CANCER CENTER ONLY)
ALT: 14 U/L (ref 0–44)
AST: 17 U/L (ref 15–41)
Albumin: 4.2 g/dL (ref 3.5–5.0)
Alkaline Phosphatase: 73 U/L (ref 38–126)
Anion gap: 7 (ref 5–15)
BUN: 21 mg/dL (ref 8–23)
CO2: 30 mmol/L (ref 22–32)
Calcium: 9.6 mg/dL (ref 8.9–10.3)
Chloride: 105 mmol/L (ref 98–111)
Creatinine: 1.27 mg/dL — ABNORMAL HIGH (ref 0.61–1.24)
GFR, Estimated: 59 mL/min — ABNORMAL LOW (ref >60)
Glucose, Bld: 123 mg/dL — ABNORMAL HIGH (ref 70–99)
Potassium: 4 mmol/L (ref 3.5–5.1)
Sodium: 142 mmol/L (ref 135–145)
Total Bilirubin: 0.6 mg/dL (ref 0.3–1.2)
Total Protein: 7.3 g/dL (ref 6.5–8.1)

## 2022-06-29 LAB — CBC WITH DIFFERENTIAL (CANCER CENTER ONLY)
Abs Immature Granulocytes: 0.02 10*3/uL (ref 0.00–0.07)
Basophils Absolute: 0 10*3/uL (ref 0.0–0.1)
Basophils Relative: 1 %
Eosinophils Absolute: 0.3 10*3/uL (ref 0.0–0.5)
Eosinophils Relative: 4 %
HCT: 42.4 % (ref 39.0–52.0)
Hemoglobin: 14.5 g/dL (ref 13.0–17.0)
Immature Granulocytes: 0 %
Lymphocytes Relative: 12 %
Lymphs Abs: 1 10*3/uL (ref 0.7–4.0)
MCH: 34 pg (ref 26.0–34.0)
MCHC: 34.2 g/dL (ref 30.0–36.0)
MCV: 99.5 fL (ref 80.0–100.0)
Monocytes Absolute: 0.6 10*3/uL (ref 0.1–1.0)
Monocytes Relative: 7 %
Neutro Abs: 6.2 10*3/uL (ref 1.7–7.7)
Neutrophils Relative %: 76 %
Platelet Count: 143 10*3/uL — ABNORMAL LOW (ref 150–400)
RBC: 4.26 MIL/uL (ref 4.22–5.81)
RDW: 13.2 % (ref 11.5–15.5)
WBC Count: 8.1 10*3/uL (ref 4.0–10.5)
nRBC: 0 % (ref 0.0–0.2)

## 2022-06-29 MED ORDER — IOHEXOL 300 MG/ML  SOLN
75.0000 mL | Freq: Once | INTRAMUSCULAR | Status: AC | PRN
  Administered 2022-06-29: 75 mL via INTRAVENOUS

## 2022-06-29 MED ORDER — SODIUM CHLORIDE (PF) 0.9 % IJ SOLN
INTRAMUSCULAR | Status: AC
  Filled 2022-06-29: qty 50

## 2022-07-03 ENCOUNTER — Inpatient Hospital Stay: Payer: Medicare Other | Admitting: Internal Medicine

## 2022-07-03 ENCOUNTER — Other Ambulatory Visit: Payer: Self-pay

## 2022-07-03 VITALS — BP 117/74 | HR 70 | Temp 97.9°F | Resp 15 | Wt 210.3 lb

## 2022-07-03 DIAGNOSIS — C3432 Malignant neoplasm of lower lobe, left bronchus or lung: Secondary | ICD-10-CM | POA: Diagnosis not present

## 2022-07-03 DIAGNOSIS — I700 Atherosclerosis of aorta: Secondary | ICD-10-CM | POA: Diagnosis not present

## 2022-07-03 DIAGNOSIS — Z79899 Other long term (current) drug therapy: Secondary | ICD-10-CM | POA: Diagnosis not present

## 2022-07-03 DIAGNOSIS — E7800 Pure hypercholesterolemia, unspecified: Secondary | ICD-10-CM | POA: Diagnosis not present

## 2022-07-03 DIAGNOSIS — I10 Essential (primary) hypertension: Secondary | ICD-10-CM | POA: Diagnosis not present

## 2022-07-03 DIAGNOSIS — E785 Hyperlipidemia, unspecified: Secondary | ICD-10-CM | POA: Diagnosis not present

## 2022-07-03 DIAGNOSIS — C3490 Malignant neoplasm of unspecified part of unspecified bronchus or lung: Secondary | ICD-10-CM

## 2022-07-03 NOTE — Progress Notes (Signed)
[place] [phone]   [phone]  OFFICE PROGRESS NOTE  [name], MD [address] [place] [address]  DIAGNOSIS:  Synchronous stage Ia (T1b, N0, M0) non-small cell lung cancer presenting with right upper lobe lung nodule in addition to right lower lobe lung nodule diagnosed in June 2023.   PRIOR THERAPY: SBRT to the 2 pulmonary nodules in the right lung under the care of Dr. Tammi Klippel completed on March 20, 2022.  CURRENT THERAPY: Observation.  INTERVAL HISTORY: John Graham 76 y.o. male returns to the clinic today for follow-up visit.  The patient is feeling fine today with no concerning complaints.  He denied having any chest pain, shortness of breath, cough or hemoptysis.  He denied having any fever or chills.  He has no nausea, vomiting, diarrhea or constipation.  He has no headache or visual changes.  He denied having any significant weight loss or night sweats.  He tolerated his previous SBRT fairly well.  He is here today for evaluation and repeat CT scan of the chest for restaging of his disease.  MEDICAL HISTORY: Past Medical History:  Diagnosis Date   Anxiety    PTSD   HOH (hard of hearing)    no hearing aids   Hypercholesteremia    Hyperlipidemia    Hypertension     ALLERGIES:  has No Known Allergies.  MEDICATIONS:  Current Outpatient Medications  Medication Sig Dispense Refill   metoprolol tartrate (LOPRESSOR) 25 MG tablet TAKE 1 TABLET BY MOUTH 2 (TWO) TIMES DAILY. *APPOINTMENT REQUIRED FOR FUTURE REFILLS* 180 tablet 0   Multiple Minerals-Vitamins (CAL MAG ZINC +D3 PO) Take 1 tablet by mouth every evening.     Multiple Vitamin (MULTIVITAMIN WITH MINERALS) TABS tablet Take 1 tablet by mouth in the morning. Centrum     sertraline (ZOLOFT) 50 MG tablet TAKE 1 TABLET BY MOUTH EVERY DAY 90 tablet 3   simvastatin (ZOCOR) 40 MG tablet TAKE 1 TABLET BY MOUTH EVERYDAY AT BEDTIME 90 tablet 3   Current  Facility-Administered Medications  Medication Dose Route Frequency Provider Last Rate Last Admin   0.9 %  sodium chloride infusion  500 mL Intravenous Once Irene Shipper, MD        SURGICAL HISTORY:  Past Surgical History:  Procedure Laterality Date   BRONCHIAL BIOPSY  02/09/2022   Procedure: BRONCHIAL BIOPSIES;  Surgeon: [name], DO;  Location: [facility];  Service: Pulmonary;;   BRONCHIAL BRUSHINGS  02/09/2022   Procedure: BRONCHIAL BRUSHINGS;  Surgeon: [name], DO;  Location: [facility];  Service: Pulmonary;;   BRONCHIAL NEEDLE ASPIRATION BIOPSY  02/09/2022   Procedure: BRONCHIAL NEEDLE ASPIRATION BIOPSIES;  Surgeon: [name], DO;  Location: [facility];  Service: Pulmonary;;   COLONOSCOPY     DENTAL SURGERY     8 dental implants   POLYPECTOMY     VIDEO BRONCHOSCOPY WITH ENDOBRONCHIAL ULTRASOUND Bilateral 02/09/2022   Procedure: VIDEO BRONCHOSCOPY WITH ENDOBRONCHIAL ULTRASOUND;  Surgeon: [name], DO;  Location: [facility];  Service: Pulmonary;  Laterality: Bilateral;   VIDEO BRONCHOSCOPY WITH RADIAL ENDOBRONCHIAL ULTRASOUND  02/09/2022   Procedure: VIDEO BRONCHOSCOPY WITH RADIAL ENDOBRONCHIAL ULTRASOUND;  Surgeon: [name], DO;  Location: [facility];  Service: Pulmonary;;    REVIEW OF SYSTEMS:  A comprehensive review of systems was negative except for: Constitutional: positive for fatigue   PHYSICAL EXAMINATION: General appearance: alert, cooperative, fatigued, and no distress Head: Normocephalic, without  obvious abnormality, atraumatic Neck: no adenopathy, no JVD, supple, symmetrical, trachea midline, and thyroid not enlarged, symmetric, no tenderness/mass/nodules Lymph nodes: Cervical, supraclavicular, and axillary nodes normal. Resp: clear to auscultation bilaterally Back: symmetric, no curvature. ROM normal. No CVA tenderness. Cardio: regular rate and rhythm, S1, S2 normal, no murmur, click, rub or gallop GI: soft, non-tender;  bowel sounds normal; no masses,  no organomegaly Extremities: extremities normal, atraumatic, no cyanosis or edema  ECOG PERFORMANCE STATUS: 1 - Symptomatic but completely ambulatory  Blood pressure 117/74, pulse 70, temperature 97.9 F (36.6 C), temperature source Oral, resp. rate 15, weight 210 lb 4.8 oz (95.4 kg), SpO2 95 %.  LABORATORY DATA: Lab Results  Component Value Date   WBC 8.1 06/29/2022   HGB 14.5 06/29/2022   HCT 42.4 06/29/2022   MCV 99.5 06/29/2022   PLT 143 (L) 06/29/2022      Chemistry      Component Value Date/Time   NA 142 06/29/2022 1056   K 4.0 06/29/2022 1056   CL 105 06/29/2022 1056   CO2 30 06/29/2022 1056   BUN 21 06/29/2022 1056   CREATININE 1.27 (H) 06/29/2022 1056   CREATININE 1.07 06/28/2020 1542      Component Value Date/Time   CALCIUM 9.6 06/29/2022 1056   ALKPHOS 73 06/29/2022 1056   AST 17 06/29/2022 1056   ALT 14 06/29/2022 1056   BILITOT 0.6 06/29/2022 1056       RADIOGRAPHIC STUDIES: CT Chest W Contrast  Result Date: 06/30/2022 CLINICAL DATA:  A 76 year old male presents for non-small cell lung cancer staging. * Tracking Code: BO * EXAM: CT CHEST WITH CONTRAST TECHNIQUE: Multidetector CT imaging of the chest was performed during intravenous contrast administration. RADIATION DOSE REDUCTION: This exam was performed according to the departmental dose-optimization program which includes automated exposure control, adjustment of the mA and/or kV according to patient size and/or use of iterative reconstruction technique. CONTRAST:  69mL OMNIPAQUE IOHEXOL 300 MG/ML  SOLN COMPARISON:  February 06, 2022. FINDINGS: Cardiovascular: Stable appearance of the heart great vessels. There is calcified and noncalcified thoracic aortic atherosclerosis without aneurysm. There is abundant eccentric soft plaque in the descending thoracic aorta (image 116/2) this measures 11 x 17 mm and extends into the aortic lumen but with smooth interface with the aortic wall.  Moderate soft plaque present in the RIGHT posterolateral thoracic aorta at the level of the aortic hiatus. Three-vessel coronary artery calcifications. Normal heart size. Normal appearance of central pulmonary vasculature. Mediastinum/Nodes: No thoracic inlet, axillary, mediastinal or hilar adenopathy. Esophagus grossly normal. Lungs/Pleura: No consolidation. No pleural effusion. Airways are patent. Small spiculated nodule in the RIGHT lower lobe (image 84/5) 8 x 8 mm is immediately adjacent to the major fissure in the anterior RIGHT lower lobe and is stable. Spiculated central nodule in the RIGHT upper lobe is imperceptible as a measurable lesion but but shows some mild spiculation about bronchial structures on image 63/5. There was discrete abnormality in this location that previously measured as much as 12-13 mm. No new suspicious pulmonary findings. Upper Abdomen: No acute findings in the upper abdomen. Musculoskeletal: Spinal degenerative changes. No acute or destructive bone process. IMPRESSION: 1. Spiculated central nodule in the RIGHT upper lobe is imperceptible as a measurable lesion but shows some mild spiculation about bronchial structures. There was discrete abnormality in this location that previously measured as much as 12-13 mm. 2. Stable 8 x 8 mm spiculated nodule in the RIGHT lower lobe immediately adjacent to the major fissure in  the anterior RIGHT lower lobe. 3. No new suspicious pulmonary findings or signs of adenopathy in the chest. 4. Aortic atherosclerosis with abundant eccentric soft plaque in the descending thoracic aorta. This measures 11 x 17 mm and extends into the aortic lumen but with smooth interface with the aortic wall. Soft plaque elsewhere. Based on this appearance the patient may be at future risk for atheroembolic phenomenon. 5. Three-vessel coronary artery calcifications. Aortic Atherosclerosis (ICD10-I70.0). These results will be called to the ordering clinician or  representative by the Radiologist Assistant, and communication documented in the PACS or [employer]. Electronically Signed   By: Zetta Bills M.D.   On: 06/30/2022 10:35    ASSESSMENT AND PLAN: This is a very pleasant 76 years old white male with history of synchronous stage Ia (T1b, N0, M0) non-small cell lung cancer presented with right upper lobe lung nodule in addition to right lower lobe nodule diagnosed in June 2023 status post SBRT under the care of Dr. Tammi Klippel completed in August 2023. The patient is currently on observation and he is feeling fine except for fatigue. He had repeat CT scan of the chest that showed improvement of the right upper lobe lung nodule and a stable pulmonary nodule in the right lower lobe. I recommended for the patient to continue on observation with repeat CT scan of the chest in 6 months. He was advised to call immediately if he has any other concerning symptoms in the interval. The patient voices understanding of current disease status and treatment options and is in agreement with the current care plan.  All questions were answered. The patient knows to call the clinic with any problems, questions or concerns. We can certainly see the patient much sooner if necessary.  The total time spent in the appointment was 20 minutes.  Disclaimer: This note was dictated with voice recognition software. Similar sounding words can inadvertently be transcribed and may not be corrected upon review.

## 2022-07-04 ENCOUNTER — Ambulatory Visit: Payer: Medicare Other | Admitting: Internal Medicine

## 2022-07-10 ENCOUNTER — Inpatient Hospital Stay: Payer: Medicare Other | Admitting: Internal Medicine

## 2022-07-15 ENCOUNTER — Other Ambulatory Visit: Payer: Self-pay | Admitting: Family Medicine

## 2022-08-16 ENCOUNTER — Telehealth: Payer: Self-pay | Admitting: Family Medicine

## 2022-08-16 NOTE — Telephone Encounter (Signed)
Left message for patient to call back and schedule Medicare Annual Wellness Visit (AWV) either virtually or in office. Left  my John Graham number [phone]   Last AWV 09/14/20 please schedule with Nurse Health Adviser   45 min for awv-i and in office appointments 30 min for awv-s  phone/virtual appointments

## 2022-10-07 ENCOUNTER — Other Ambulatory Visit: Payer: Self-pay | Admitting: Family Medicine

## 2022-10-09 ENCOUNTER — Telehealth: Payer: Self-pay | Admitting: Family Medicine

## 2022-10-09 NOTE — Telephone Encounter (Signed)
Called patient to schedule Medicare Annual Wellness Visit (AWV). Left message for patient to call back and schedule Medicare Annual Wellness Visit (AWV).  Last date of AWV: 09/14/20  Please schedule an appointment at any time with [facility] or [employer].  If any questions, please contact me at [phone].  Thank you ,  Barkley Boards AWV direct phone # [phone]   Previous message left lm 08/16/22  cb 2024  *wcb 02/22/22(didn't seem interested)  l/m 01/17/22 Needs OV - last seent 06/28/20 checked 12/05/21  *DUE 09/2021

## 2022-10-27 NOTE — Progress Notes (Deleted)
   NEUROLOGY FOLLOW UP OFFICE NOTE  YASH LASSMAN [id number]  Assessment/Plan:   ***  Subjective:  GOVANI PALENZUELA is a 77 year old male with hypertension, hyperlipidemia, PTSD and *** who follows up for Parkinsonian tremor.  History supplemented by prior neurologist's note.  UPDATE: ***  HISTORY: For several years, he has had a pill rolling tremor, right worse than left.  He notices it when writing.  He has difficulty getting out of a chair but otherwise has denied difficulty with movement or ambulation.  No double vision or dysphagia.  No lightheadedness or syncope.  No vivid dreams or acting out in his dreams.  He does note loss of smell and taste.  No urinary incontinence.  He was seen by Dr. Wells Guiles Tat, a movement disorder specialist, in June 2023 who noted Parkinsonian tremor but otherwise did not exhibit parkinsonian features on exam and therefore did not meet criteria for Parkinson's disease.  PAST MEDICAL HISTORY: Past Medical History:  Diagnosis Date   Anxiety    PTSD   HOH (hard of hearing)    no hearing aids   Hypercholesteremia    Hyperlipidemia    Hypertension     MEDICATIONS: Current Outpatient Medications on File Prior to Visit  Medication Sig Dispense Refill   metoprolol tartrate (LOPRESSOR) 25 MG tablet TAKE 1 TABLET BY MOUTH 2 (TWO) TIMES DAILY. *APPOINTMENT REQUIRED FOR FUTURE REFILLS* 180 tablet 0   Multiple Minerals-Vitamins (CAL MAG ZINC +D3 PO) Take 1 tablet by mouth every evening.     Multiple Vitamin (MULTIVITAMIN WITH MINERALS) TABS tablet Take 1 tablet by mouth in the morning. Centrum     sertraline (ZOLOFT) 50 MG tablet TAKE 1 TABLET BY MOUTH EVERY DAY 90 tablet 3   simvastatin (ZOCOR) 40 MG tablet TAKE 1 TABLET BY MOUTH EVERYDAY AT BEDTIME 90 tablet 3   Current Facility-Administered Medications on File Prior to Visit  Medication Dose Route Frequency Provider Last Rate Last Admin   0.9 %  sodium chloride infusion  500 mL Intravenous Once Irene Shipper, MD        ALLERGIES: No Known Allergies  FAMILY HISTORY: Family History  Problem Relation Age of Onset   Pancreatic cancer Mother    Cancer Father        Lymph Nodes   Colon cancer Neg Hx       Objective:  *** General: No acute distress.  Patient appears ***-groomed.   Head:  Normocephalic/atraumatic Eyes:  Fundi examined but not visualized Neck: supple, no paraspinal tenderness, full range of motion Heart:  Regular rate and rhythm Lungs:  Clear to auscultation bilaterally Back: No paraspinal tenderness Neurological Exam: alert and oriented to person, place, and time.  Speech fluent and not dysarthric, language intact.  CN II-XII intact. Bulk and tone normal, muscle strength 5/5 throughout.  Sensation to light touch intact.  Deep tendon reflexes 2+ throughout, toes downgoing.  Finger to nose testing intact.  Gait normal, Romberg negative.   Metta Clines, DO  CC: ***

## 2022-10-31 ENCOUNTER — Ambulatory Visit: Payer: Medicare Other | Admitting: Neurology

## 2022-12-20 ENCOUNTER — Telehealth: Payer: Self-pay | Admitting: Family Medicine

## 2022-12-20 NOTE — Telephone Encounter (Signed)
Contacted [name] to schedule their annual wellness visit. Call back at later date: 02/2023  Rudell Cobb AWV direct phone # [phone]   Spoke with patient daughter she stated patient spouse in hospital she wanted a call back in july

## 2022-12-20 NOTE — Progress Notes (Unsigned)
NEUROLOGY FOLLOW UP OFFICE NOTE  John Graham [id number]  Assessment/Plan:   Parkinsonian tremor - he continues to not exhibited other symptoms on exam to establish definite diagnosis of Parkinson's disease.  We discussed a carbidopa-levodopa trial, although it may not always treat tremor.  He does not think the tremors hinder his quality of life enough to want to start medication.  Will continue to monitor for now.  Follow up in one year or as needed.  Total time spent reviewing chart and face to face with patient in examination and discussing diagnosis and plan:  34 minutes.  Subjective:  John Graham is a 77 year old male with HTN, HLD, and PTSD who follows up for Parkinsonian tremor.  Previously seen by my colleague, Dr. Arbutus Leas, in June 2023.  Her note reviewed.  He is accompanied by his daughter who also supplements history.  He has had a pill rolling tremor since at least 5 years, involving both hands.  Notices it spontaneously at rest and while writing.  Not aggravated by stress or fatigue.  Does not drink alcohol or caffeinated beverages.  Reports trouble buttoning clothes and sometimes getting out of a chair.  Notes loss of smell and taste.  Some mild short-term memory difficulty but independent.  No double vision, lightheadedness, incontinence, or difficulty swallowing.  No history consistent with REM behavioral sleep disorder.  No family history of tremor.  Evaluated by Dr. Lurena Joiner Tat, movement disorder specialist, in June 2023.  At that time he demonstrated Parkinsonian tremor but no other clear signs of PD that would satisfy criteria for formal diagnosis of Parkinson's disease.  Recommended monitoring.    PAST MEDICAL HISTORY: Past Medical History:  Diagnosis Date   Anxiety    PTSD   HOH (hard of hearing)    no hearing aids   Hypercholesteremia    Hyperlipidemia    Hypertension     MEDICATIONS: Current Outpatient Medications on File Prior to Visit  Medication Sig  Dispense Refill   metoprolol tartrate (LOPRESSOR) 25 MG tablet TAKE 1 TABLET BY MOUTH 2 (TWO) TIMES DAILY. *APPOINTMENT REQUIRED FOR FUTURE REFILLS* 180 tablet 0   Multiple Minerals-Vitamins (CAL MAG ZINC +D3 PO) Take 1 tablet by mouth every evening.     Multiple Vitamin (MULTIVITAMIN WITH MINERALS) TABS tablet Take 1 tablet by mouth in the morning. Centrum     sertraline (ZOLOFT) 50 MG tablet TAKE 1 TABLET BY MOUTH EVERY DAY 90 tablet 3   simvastatin (ZOCOR) 40 MG tablet TAKE 1 TABLET BY MOUTH EVERYDAY AT BEDTIME 90 tablet 3   Current Facility-Administered Medications on File Prior to Visit  Medication Dose Route Frequency Provider Last Rate Last Admin   0.9 %  sodium chloride infusion  500 mL Intravenous Once Hilarie Fredrickson, MD        ALLERGIES: No Known Allergies  FAMILY HISTORY: Family History  Problem Relation Age of Onset   Pancreatic cancer Mother    Cancer Father        Lymph Nodes   Colon cancer Neg Hx       Objective:  Blood pressure 122/69, pulse 96, height 5\' 11"  (1.803 m), weight 207 lb (93.9 kg), SpO2 98 %. General: No acute distress.  Patient appears well-groomed.   Head:  Normocephalic/atraumatic Eyes:  Fundi examined but not visualized Neck: supple, no paraspinal tenderness, full range of motion Heart:  Regular rate and rhythm Lungs:  Clear to auscultation bilaterally Back: No paraspinal tenderness Neurological Exam: alert  and oriented to person, place, and time.  Speech fluent and not dysarthric, language intact.  Slight right sided lower facial weakness.  Decreased hearing bilaterally.  Otherwise, CN II-XII intact. Bulk and tone normal, no rigidity or bradykinesia; muscle strength 5/5 throughout.  Right hand resting tremor.  No postural or kinetic tremor noted.  Sensation to light touch intact.  Deep tendon reflexes 2+ throughout  Finger to nose testing intact.  Gait with upright posture and normal arm swing that is wide-based but not shuffling.  Romberg with mild  sway.   Shon Millet, DO  CC: Evelena Peat, MD

## 2022-12-21 ENCOUNTER — Ambulatory Visit: Payer: Medicare Other | Admitting: Neurology

## 2022-12-21 ENCOUNTER — Encounter: Payer: Self-pay | Admitting: Neurology

## 2022-12-21 VITALS — BP 122/69 | HR 96 | Ht 71.0 in | Wt 207.0 lb

## 2022-12-21 DIAGNOSIS — G20C Parkinsonism, unspecified: Secondary | ICD-10-CM

## 2022-12-21 NOTE — Patient Instructions (Signed)
Will continue to monitor.  Follow up in one year or as needed.

## 2022-12-28 ENCOUNTER — Inpatient Hospital Stay: Payer: Medicare Other | Attending: Family Medicine

## 2022-12-28 ENCOUNTER — Other Ambulatory Visit: Payer: Self-pay | Admitting: Family Medicine

## 2022-12-29 ENCOUNTER — Ambulatory Visit: Payer: Medicare Other | Admitting: Family Medicine

## 2022-12-31 ENCOUNTER — Other Ambulatory Visit: Payer: Self-pay | Admitting: Family Medicine

## 2023-01-01 ENCOUNTER — Inpatient Hospital Stay: Payer: Medicare Other | Admitting: Internal Medicine

## 2023-01-27 ENCOUNTER — Other Ambulatory Visit: Payer: Self-pay | Admitting: Family Medicine

## 2023-01-31 ENCOUNTER — Other Ambulatory Visit: Payer: Self-pay | Admitting: Family Medicine

## 2023-02-23 ENCOUNTER — Ambulatory Visit (INDEPENDENT_AMBULATORY_CARE_PROVIDER_SITE_OTHER): Payer: Medicare Other | Admitting: Family Medicine

## 2023-02-23 VITALS — BP 106/60 | HR 82 | Temp 98.2°F | Ht 71.0 in | Wt 203.5 lb

## 2023-02-23 DIAGNOSIS — H6123 Impacted cerumen, bilateral: Secondary | ICD-10-CM

## 2023-02-23 DIAGNOSIS — I10 Essential (primary) hypertension: Secondary | ICD-10-CM

## 2023-02-23 DIAGNOSIS — E785 Hyperlipidemia, unspecified: Secondary | ICD-10-CM | POA: Diagnosis not present

## 2023-02-23 DIAGNOSIS — C3491 Malignant neoplasm of unspecified part of right bronchus or lung: Secondary | ICD-10-CM

## 2023-02-23 MED ORDER — METOPROLOL TARTRATE 25 MG PO TABS: 25.0000 mg | ORAL_TABLET | Freq: Two times a day (BID) | ORAL | 3 refills | Status: DC

## 2023-02-23 NOTE — Progress Notes (Signed)
Established Patient Office Visit  Subjective   Patient ID: John Graham, male    DOB: [birth year]  Age: 77 y.o. MRN: [id number]  Chief Complaint  Patient presents with   Cerumen Impaction    HPI   Mr Seepersad is seen today accompanied by his daughter.  He was diagnosed over a year ago with non-small cell lung cancer.  Followed closely by oncology.  He also has Parkinson's currently not treated with any regular medications.  Has hypertension treated with metoprolol tartrate 25 mg twice daily and needing refills.  Blood pressures have been very stable.  He takes simvastatin for hyperlipidemia 40 mg daily.  Overdue for labs.  His major complaint today is concern for bilateral cerumen impaction.  Has had cerumen impactions in the past.  Sometimes after showering and water getting in the ears feels like his hearing is worse.  No ear pain.  No drainage.  Still smokes.  No alcohol.  His wife has had major health decline in the past year and currently is in a rehab facility.  She may have some dementia issues.  Past Medical History:  Diagnosis Date   Anxiety    PTSD   HOH (hard of hearing)    no hearing aids   Hypercholesteremia    Hyperlipidemia    Hypertension    Past Surgical History:  Procedure Laterality Date   BRONCHIAL BIOPSY  02/09/2022   Procedure: BRONCHIAL BIOPSIES;  Surgeon: Josephine Igo, DO;  Location: [facility];  Service: Pulmonary;;   BRONCHIAL BRUSHINGS  02/09/2022   Procedure: BRONCHIAL BRUSHINGS;  Surgeon: Josephine Igo, DO;  Location: [facility];  Service: Pulmonary;;   BRONCHIAL NEEDLE ASPIRATION BIOPSY  02/09/2022   Procedure: BRONCHIAL NEEDLE ASPIRATION BIOPSIES;  Surgeon: Josephine Igo, DO;  Location: [facility];  Service: Pulmonary;;   COLONOSCOPY     DENTAL SURGERY     8 dental implants   POLYPECTOMY     VIDEO BRONCHOSCOPY WITH ENDOBRONCHIAL ULTRASOUND Bilateral 02/09/2022   Procedure: VIDEO BRONCHOSCOPY WITH ENDOBRONCHIAL ULTRASOUND;  Surgeon:  Josephine Igo, DO;  Location: [facility];  Service: Pulmonary;  Laterality: Bilateral;   VIDEO BRONCHOSCOPY WITH RADIAL ENDOBRONCHIAL ULTRASOUND  02/09/2022   Procedure: VIDEO BRONCHOSCOPY WITH RADIAL ENDOBRONCHIAL ULTRASOUND;  Surgeon: Josephine Igo, DO;  Location: [facility];  Service: Pulmonary;;    reports that he has been smoking cigarettes. He has a 53 pack-year smoking history. He has never used smokeless tobacco. He reports that he does not drink alcohol and does not use drugs. family history includes Cancer in his father; Pancreatic cancer in his mother. No Known Allergies  Review of Systems  Constitutional:  Negative for malaise/fatigue.  Eyes:  Negative for blurred vision.  Respiratory:  Negative for cough, hemoptysis and shortness of breath.   Cardiovascular:  Negative for chest pain.  Neurological:  Negative for dizziness, weakness and headaches.      Objective:     BP 106/60 (BP Location: Left Arm, Patient Position: Sitting, Cuff Size: Normal)   Pulse 82   Temp 98.2 F (36.8 C) (Oral)   Ht 5\' 11"  (1.803 m)   Wt 203 lb 8 oz (92.3 kg)   SpO2 95%   BMI 28.38 kg/m  BP Readings from Last 3 Encounters:  02/23/23 106/60  12/21/22 122/69  07/03/22 117/74   Wt Readings from Last 3 Encounters:  02/23/23 203 lb 8 oz (92.3 kg)  12/21/22 207 lb (93.9 kg)  07/03/22 210 lb 4.8 oz (95.4  kg)      Physical Exam Constitutional:      Appearance: He is well-developed.  HENT:     Ears:     Comments: Bilateral cerumen impactions Eyes:     Pupils: Pupils are equal, round, and reactive to light.  Neck:     Thyroid: No thyromegaly.  Cardiovascular:     Rate and Rhythm: Normal rate and regular rhythm.  Pulmonary:     Effort: Pulmonary effort is normal. No respiratory distress.     Breath sounds: Normal breath sounds. No wheezing or rales.  Musculoskeletal:     Cervical back: Neck supple.  Neurological:     Mental Status: He is alert and oriented to person,  place, and time.      No results found for any visits on 02/23/23.  Last CBC Lab Results  Component Value Date   WBC 8.1 06/29/2022   HGB 14.5 06/29/2022   HCT 42.4 06/29/2022   MCV 99.5 06/29/2022   MCH 34.0 06/29/2022   RDW 13.2 06/29/2022   PLT 143 (L) 06/29/2022   Last metabolic panel Lab Results  Component Value Date   GLUCOSE 123 (H) 06/29/2022   NA 142 06/29/2022   K 4.0 06/29/2022   CL 105 06/29/2022   CO2 30 06/29/2022   BUN 21 06/29/2022   CREATININE 1.27 (H) 06/29/2022   GFRNONAA 59 (L) 06/29/2022   CALCIUM 9.6 06/29/2022   PROT 7.3 06/29/2022   ALBUMIN 4.2 06/29/2022   BILITOT 0.6 06/29/2022   ALKPHOS 73 06/29/2022   AST 17 06/29/2022   ALT 14 06/29/2022   ANIONGAP 7 06/29/2022      The 10-year ASCVD risk score (Arnett DK, et al., 2019) is: 24.7%    Assessment & Plan:   #1 bilateral cerumen impactions.  Affecting hearing.  Discussed irrigation with patient.  We discussed risk including pain, bleeding, low risk of perforation of eardrum.  Patient consented.  Both ears were irrigated with removal of cerumen.  Hearing improved afterwards.  Pt tolerated well.    #2 hyperlipidemia treated with simvastatin 40 mg daily.  Recheck lipid and hepatic panel today  #3 hypertension stable and well-controlled on metoprolol tartrate 25 mg twice daily.  Refill medication for 1 year  #4 non-small cell lung cancer followed by oncology.  Still smoking.  Low motivation to quit.     Return in about 1 year (around 02/23/2024).    Evelena Peat, MD

## 2023-02-24 LAB — HEPATIC FUNCTION PANEL
AG Ratio: 1.6 (calc) (ref 1.0–2.5)
ALT: 15 U/L (ref 9–46)
AST: 16 U/L (ref 10–35)
Albumin: 4.2 g/dL (ref 3.6–5.1)
Alkaline phosphatase (APISO): 75 U/L (ref 35–144)
Bilirubin, Direct: 0.1 mg/dL (ref 0.0–0.2)
Globulin: 2.7 g/dL (calc) (ref 1.9–3.7)
Indirect Bilirubin: 0.6 mg/dL (calc) (ref 0.2–1.2)
Total Bilirubin: 0.7 mg/dL (ref 0.2–1.2)
Total Protein: 6.9 g/dL (ref 6.1–8.1)

## 2023-02-24 LAB — LIPID PANEL
Cholesterol: 176 mg/dL (ref <200)
HDL: 45 mg/dL (ref >=40)
LDL Cholesterol (Calc): 111 mg/dL (calc) — ABNORMAL HIGH
Non-HDL Cholesterol (Calc): 131 mg/dL (calc) — ABNORMAL HIGH (ref <130)
Total CHOL/HDL Ratio: 3.9 (calc) (ref <5.0)
Triglycerides: 94 mg/dL (ref <150)

## 2023-02-26 MED ORDER — EZETIMIBE 10 MG PO TABS: 10.0000 mg | ORAL_TABLET | Freq: Every day | ORAL | 0 refills | Status: DC

## 2023-02-26 NOTE — Addendum Note (Signed)
Addended by: Laneta Simmers L on: 02/26/2023 05:00 PM   Modules accepted: Orders

## 2023-03-02 ENCOUNTER — Ambulatory Visit (INDEPENDENT_AMBULATORY_CARE_PROVIDER_SITE_OTHER): Payer: Medicare Other

## 2023-03-02 VITALS — Ht 71.0 in | Wt 205.0 lb

## 2023-03-02 DIAGNOSIS — Z0000 Encounter for general adult medical examination without abnormal findings: Secondary | ICD-10-CM | POA: Diagnosis not present

## 2023-03-02 NOTE — Progress Notes (Signed)
Subjective:   John Graham is a 77 y.o. male who presents for Medicare Annual/Subsequent preventive examination.  Visit Complete: Virtual  I connected with  John Graham on 03/02/23 by a audio enabled telemedicine application and verified that I am speaking with the correct person using two identifiers.  Patient Location: Home  Provider Location: Home Office  I discussed the limitations of evaluation and management by telemedicine. The patient expressed understanding and agreed to proceed.  Patient Medicare AWV questionnaire was completed by the patient on ; I have confirmed that all information answered by patient is correct and no changes since this date.  Review of Systems     Cardiac Risk Factors include: advanced age (>3men, >60 women);hypertension;male gender     Objective:    Today's Vitals   03/02/23 1459  Weight: 205 lb (93 kg)  Height: 5\' 11"  (1.803 m)   Body mass index is 28.59 kg/m.     03/02/2023    3:33 PM 12/21/2022   11:43 AM 04/26/2022    9:55 AM 02/22/2022    1:33 PM 02/09/2022    6:38 AM 01/30/2022    9:51 AM 09/14/2020    3:39 PM  Advanced Directives  Does Patient Have a Medical Advance Directive? No No No No No No Yes  Type of Tax inspector;Living will  Does patient want to make changes to medical advance directive?       No - Patient declined  Copy of Healthcare Power of Attorney in Chart?       No - copy requested  Would patient like information on creating a medical advance directive? No - Patient declined  Yes (ED - Information included in AVS)  No - Patient declined      Current Medications (verified) Outpatient Encounter Medications as of 03/02/2023  Medication Sig   ezetimibe (ZETIA) 10 MG tablet Take 1 tablet (10 mg total) by mouth daily.   metoprolol tartrate (LOPRESSOR) 25 MG tablet Take 1 tablet (25 mg total) by mouth 2 (two) times daily.   Multiple Minerals-Vitamins (CAL MAG ZINC +D3 PO) Take 1  tablet by mouth every evening.   Multiple Vitamin (MULTIVITAMIN WITH MINERALS) TABS tablet Take 1 tablet by mouth in the morning. Centrum   sertraline (ZOLOFT) 50 MG tablet TAKE 1 TABLET BY MOUTH EVERY DAY   simvastatin (ZOCOR) 40 MG tablet TAKE 1 TABLET BY MOUTH EVERYDAY AT BEDTIME   Facility-Administered Encounter Medications as of 03/02/2023  Medication   0.9 %  sodium chloride infusion    Allergies (verified) Patient has no known allergies.   History: Past Medical History:  Diagnosis Date   Anxiety    PTSD   HOH (hard of hearing)    no hearing aids   Hypercholesteremia    Hyperlipidemia    Hypertension    Past Surgical History:  Procedure Laterality Date   BRONCHIAL BIOPSY  02/09/2022   Procedure: BRONCHIAL BIOPSIES;  Surgeon: Josephine Igo, DO;  Location: [facility];  Service: Pulmonary;;   BRONCHIAL BRUSHINGS  02/09/2022   Procedure: BRONCHIAL BRUSHINGS;  Surgeon: Josephine Igo, DO;  Location: [facility];  Service: Pulmonary;;   BRONCHIAL NEEDLE ASPIRATION BIOPSY  02/09/2022   Procedure: BRONCHIAL NEEDLE ASPIRATION BIOPSIES;  Surgeon: Josephine Igo, DO;  Location: [facility];  Service: Pulmonary;;   COLONOSCOPY     DENTAL SURGERY     8 dental implants   POLYPECTOMY  VIDEO BRONCHOSCOPY WITH ENDOBRONCHIAL ULTRASOUND Bilateral 02/09/2022   Procedure: VIDEO BRONCHOSCOPY WITH ENDOBRONCHIAL ULTRASOUND;  Surgeon: Josephine Igo, DO;  Location: [facility];  Service: Pulmonary;  Laterality: Bilateral;   VIDEO BRONCHOSCOPY WITH RADIAL ENDOBRONCHIAL ULTRASOUND  02/09/2022   Procedure: VIDEO BRONCHOSCOPY WITH RADIAL ENDOBRONCHIAL ULTRASOUND;  Surgeon: Josephine Igo, DO;  Location: [facility];  Service: Pulmonary;;   Family History  Problem Relation Age of Onset   Pancreatic cancer Mother    Cancer Father        Lymph Nodes   Colon cancer Neg Hx    Social History   Socioeconomic History   Marital status: Married    Spouse name: Not on file   Number  of children: Not on file   Years of education: Not on file   Highest education level: Not on file  Occupational History   Not on file  Tobacco Use   Smoking status: Every Day    Current packs/day: 1.00    Average packs/day: 1 pack/day for 53.0 years (53.0 ttl pk-yrs)    Types: Cigarettes   Smokeless tobacco: Never   Tobacco comments:    20 cigarettes smoked daily 02/17/2022 hfb  Vaping Use   Vaping status: Never Used  Substance and Sexual Activity   Alcohol use: No   Drug use: No   Sexual activity: Not on file  Other Topics Concern   Not on file  Social History Narrative   ** Merged History Encounter **    Left handed    Control and instrumentation engineer    Social Determinants of Health   Financial Resource Strain: Low Risk  (03/02/2023)   Overall Financial Resource Strain (CARDIA)    Difficulty of Paying Living Expenses: Not hard at all  Food Insecurity: No Food Insecurity (03/02/2023)   Hunger Vital Sign    Worried About Running Out of Food in the Last Year: Never true    Ran Out of Food in the Last Year: Never true  Transportation Needs: No Transportation Needs (03/02/2023)   PRAPARE - Administrator, Civil Service (Medical): No    Lack of Transportation (Non-Medical): No  Physical Activity: Inactive (03/02/2023)   Exercise Vital Sign    Days of Exercise per Week: 0 days    Minutes of Exercise per Session: 0 min  Stress: No Stress Concern Present (03/02/2023)   [employer] of Occupational Health - Occupational Stress Questionnaire    Feeling of Stress : Not at all  Social Connections: Socially Integrated (03/02/2023)   Social Connection and Isolation Panel [NHANES]    Frequency of Communication with Friends and Family: More than three times a week    Frequency of Social Gatherings with Friends and Family: More than three times a week    Attends Religious Services: More than 4 times per year    Active Member of [employer] or Organizations: Yes    Attends Museum/gallery exhibitions officer: More than 4 times per year    Marital Status: Married    Tobacco Counseling Ready to quit: No Counseling given: Yes Tobacco comments: 20 cigarettes smoked daily 02/17/2022 hfb   Clinical Intake:  Pre-visit preparation completed: Yes  Pain : No/denies pain     BMI - recorded: 28.59 Nutritional Status: BMI 25 -29 Overweight Nutritional Risks: None Diabetes: No  How often do you need to have someone help you when you read instructions, pamphlets, or other written materials from your doctor or pharmacy?: 1 - Never  Interpreter  Needed?: No  Information entered by :: Theresa Mulligan LPN   Activities of Daily Living    03/02/2023    3:32 PM  In your present state of health, do you have any difficulty performing the following activities:  Hearing? 0  Vision? 0  Difficulty concentrating or making decisions? 0  Walking or climbing stairs? 0  Dressing or bathing? 0  Doing errands, shopping? 0  Preparing Food and eating ? N  Using the Toilet? N  In the past six months, have you accidently leaked urine? N  Do you have problems with loss of bowel control? N  Managing your Medications? N  Managing your Finances? N  Housekeeping or managing your Housekeeping? N    Patient Care Team: Kristian Covey, MD as PCP - General Reinaldo Raddle, Encarnacion Slates, [facility] (Inactive) as Pharmacist (Pharmacist) Tat, Octaviano Batty, DO as Consulting Physician (Neurology)  Indicate any recent Medical Services you may have received from other than [facility] providers in the past year (date may be approximate).     Assessment:   This is a routine wellness examination for [facility].  Hearing/Vision screen Hearing Screening - Comments:: Denies hearing difficulties   Vision Screening - Comments:: Wears rx glasses - Not up to date with routine eye exams with  Deferred  Dietary issues and exercise activities discussed:     Goals Addressed               This Visit's Progress     Stay  Healthy (pt-stated)         Depression Screen    03/02/2023    3:30 PM 02/23/2023    2:38 PM 12/30/2021    2:40 PM 12/27/2021    1:17 PM 09/14/2020    3:42 PM 06/04/2018    4:43 PM 05/14/2017    1:58 PM  PHQ 2/9 Scores  PHQ - 2 Score 0 0 0 0 0 0 0  PHQ- 9 Score     0      Fall Risk    03/02/2023    3:32 PM 12/21/2022   11:43 AM 01/30/2022    9:51 AM 12/27/2021    1:16 PM 09/14/2020    3:41 PM  Fall Risk   Falls in the past year? 0 0 0 0 0  Number falls in past yr: 0 0 0 0 0  Injury with Fall? 0 0 0 0 0  Risk for fall due to : No Fall Risks   No Fall Risks No Fall Risks  Follow up Falls prevention discussed Falls evaluation completed  Falls evaluation completed Falls evaluation completed;Falls prevention discussed    MEDICARE RISK AT HOME:  Medicare Risk at Home - 03/02/23 1538     Any stairs in or around the home? Yes    If so, are there any without handrails? No    Home free of loose throw rugs in walkways, pet beds, electrical cords, etc? Yes    Adequate lighting in your home to reduce risk of falls? Yes    Life alert? No    Use of a cane, walker or w/c? No    Grab bars in the bathroom? Yes    Shower chair or bench in shower? Yes    Elevated toilet seat or a handicapped toilet? Yes             TIMED UP AND GO:  Was the test performed?  No    Cognitive Function:  03/02/2023    3:33 PM  6CIT Screen  What Year? 0 points  What month? 0 points  What time? 0 points  Count back from 20 0 points  Months in reverse 0 points  Repeat phrase 0 points  Total Score 0 points    Immunizations Immunization History  Administered Date(s) Administered   Fluad Quad(high Dose 65+) 06/28/2020   Influenza Split 05/22/2011, 05/24/2012   Influenza, High Dose Seasonal PF 06/06/2013, 04/04/2017, 06/04/2018, 04/29/2019, 07/02/2021   Influenza,inj,Quad PF,6+ Mos 04/24/2014   Influenza-Unspecified 05/14/2009, 05/26/2019   PFIZER(Purple Top)SARS-COV-2 Vaccination 10/09/2019,  11/07/2019, 07/21/2020   Pfizer Covid-19 Vaccine Bivalent Booster 51yrs & up 07/02/2021   Pneumococcal Conjugate-13 01/04/2012, 04/06/2015   Pneumococcal Polysaccharide-23 05/14/2017   Pneumococcal-Unspecified 04/10/2011   Tdap 01/04/2012   Zoster, Live 06/24/2012    TDAP status: Due, Education has been provided regarding the importance of this vaccine. Advised may receive this vaccine at local pharmacy or Health Dept. Aware to provide a copy of the vaccination record if obtained from local pharmacy or Health Dept. Verbalized acceptance and understanding.  Flu Vaccine status: Due, Education has been provided regarding the importance of this vaccine. Advised may receive this vaccine at local pharmacy or Health Dept. Aware to provide a copy of the vaccination record if obtained from local pharmacy or Health Dept. Verbalized acceptance and understanding.  Pneumococcal vaccine status: Up to date  Covid-19 vaccine status: Completed vaccines  Qualifies for Shingles Vaccine? Yes   Zostavax completed No   Shingrix Completed?: No.    Education has been provided regarding the importance of this vaccine. Patient has been advised to call insurance company to determine out of pocket expense if they have not yet received this vaccine. Advised may also receive vaccine at local pharmacy or Health Dept. Verbalized acceptance and understanding.  Screening Tests Health Maintenance  Topic Date Due   DTaP/Tdap/Td (2 - Td or Tdap) 01/03/2022   COVID-19 Vaccine (5 - 2023-24 season) 03/18/2023 (Originally 04/14/2022)   Zoster Vaccines- Shingrix (1 of 2) 06/02/2023 (Originally 01/02/1965)   Colonoscopy  03/01/2024 (Originally 09/13/2020)   INFLUENZA VACCINE  03/15/2023   Medicare Annual Wellness (AWV)  03/01/2024   Pneumonia Vaccine 20+ Years old  Completed   Hepatitis C Screening  Completed   HPV VACCINES  Aged Out    Health Maintenance  Health Maintenance Due  Topic Date Due   DTaP/Tdap/Td (2 - Td or  Tdap) 01/03/2022    Colorectal cancer screening: Type of screening: Colonoscopy. Completed 09/14/20. Repeat every 3 years  Lung Cancer Screening: (Low Dose CT Chest recommended if Age 40-80 years, 20 pack-year currently smoking OR have quit w/in 15years.) does not qualify.     Additional Screening:  Hepatitis C Screening: does qualify; Completed 05/14/17  Vision Screening: Recommended annual ophthalmology exams for early detection of glaucoma and other disorders of the eye. Is the patient up to date with their annual eye exam?  No Who is the provider or what is the name of the office in which the patient attends annual eye exams? Deferred If pt is not established with a provider, would they like to be referred to a provider to establish care? No .   Dental Screening: Recommended annual dental exams for proper oral hygiene    Community Resource Referral / Chronic Care Management:  CRR required this visit?  No   CCM required this visit?  No     Plan:     I have personally reviewed and noted the  following in the patient's chart:   Medical and social history Use of alcohol, tobacco or illicit drugs  Current medications and supplements including opioid prescriptions. Patient is not currently taking opioid prescriptions. Functional ability and status Nutritional status Physical activity Advanced directives List of other physicians Hospitalizations, surgeries, and ER visits in previous 12 months Vitals Screenings to include cognitive, depression, and falls Referrals and appointments  In addition, I have reviewed and discussed with patient certain preventive protocols, quality metrics, and best practice recommendations. A written personalized care plan for preventive services as well as general preventive health recommendations were provided to patient.     Tillie Rung, LPN   5/40/9811   After Visit Summary: (Mail) Due to this being a telephonic visit, the after visit  summary with patients personalized plan was offered to patient via mail   Nurse Notes: None

## 2023-03-02 NOTE — Patient Instructions (Addendum)
John Graham , Thank you for taking time to come for your Medicare Wellness Visit. I appreciate your ongoing commitment to your health goals. Please review the following plan we discussed and let me know if I can assist you in the future.   These are the goals we discussed:  Goals       Exercise 150 min/wk Moderate Activity      Stay Healthy (pt-stated)        This is a list of the screening recommended for you and due dates:  Health Maintenance  Topic Date Due   DTaP/Tdap/Td vaccine (2 - Td or Tdap) 01/03/2022   COVID-19 Vaccine (5 - 2023-24 season) 03/18/2023*   Zoster (Shingles) Vaccine (1 of 2) 06/02/2023*   Colon Cancer Screening  03/01/2024*   Flu Shot  03/15/2023   Medicare Annual Wellness Visit  03/01/2024   Pneumonia Vaccine  Completed   Hepatitis C Screening  Completed   HPV Vaccine  Aged Out  *Topic was postponed. The date shown is not the original due date.    Advanced directives: Advance directive discussed with you today. Even though you declined this today, please call our office should you change your mind, and we can give you the proper paperwork for you to fill out.   Conditions/risks identified: None  Next appointment: Follow up in one year for your annual wellness visit.   Preventive Care 77 Years and Older, Male  Preventive care refers to lifestyle choices and visits with your health care provider that can promote health and wellness. What does preventive care include? A yearly physical exam. This is also called an annual well check. Dental exams once or twice a year. Routine eye exams. Ask your health care provider how often you should have your eyes checked. Personal lifestyle choices, including: Daily care of your teeth and gums. Regular physical activity. Eating a healthy diet. Avoiding tobacco and drug use. Limiting alcohol use. Practicing safe sex. Taking low doses of aspirin every day. Taking vitamin and mineral supplements as recommended by  your health care provider. What happens during an annual well check? The services and screenings done by your health care provider during your annual well check will depend on your age, overall health, lifestyle risk factors, and family history of disease. Counseling  Your health care provider may ask you questions about your: Alcohol use. Tobacco use. Drug use. Emotional well-being. Home and relationship well-being. Sexual activity. Eating habits. History of falls. Memory and ability to understand (cognition). Work and work Astronomer. Screening  You may have the following tests or measurements: Height, weight, and BMI. Blood pressure. Lipid and cholesterol levels. These may be checked every 5 years, or more frequently if you are over 10 years old. Skin check. Lung cancer screening. You may have this screening every year starting at age 77 if you have a 30-pack-year history of smoking and currently smoke or have quit within the past 15 years. Fecal occult blood test (FOBT) of the stool. You may have this test every year starting at age 77. Flexible sigmoidoscopy or colonoscopy. You may have a sigmoidoscopy every 5 years or a colonoscopy every 10 years starting at age 77. Prostate cancer screening. Recommendations will vary depending on your family history and other risks. Hepatitis C blood test. Hepatitis B blood test. Sexually transmitted disease (STD) testing. Diabetes screening. This is done by checking your blood sugar (glucose) after you have not eaten for a while (fasting). You may have this done every  1-3 years. Abdominal aortic aneurysm (AAA) screening. You may need this if you are a current or former smoker. Osteoporosis. You may be screened starting at age 77 if you are at high risk. Talk with your health care provider about your test results, treatment options, and if necessary, the need for more tests. Vaccines  Your health care provider may recommend certain vaccines,  such as: Influenza vaccine. This is recommended every year. Tetanus, diphtheria, and acellular pertussis (Tdap, Td) vaccine. You may need a Td booster every 10 years. Zoster vaccine. You may need this after age 21. Pneumococcal 13-valent conjugate (PCV13) vaccine. One dose is recommended after age 29. Pneumococcal polysaccharide (PPSV23) vaccine. One dose is recommended after age 35. Talk to your health care provider about which screenings and vaccines you need and how often you need them. This information is not intended to replace advice given to you by your health care provider. Make sure you discuss any questions you have with your health care provider. Document Released: 08/27/2015 Document Revised: 04/19/2016 Document Reviewed: 06/01/2015 Elsevier Interactive Patient Education  2017 [employer].  Fall Prevention in the Home Falls can cause injuries. They can happen to people of all ages. There are many things you can do to make your home safe and to help prevent falls. What can I do on the outside of my home? Regularly fix the edges of walkways and driveways and fix any cracks. Remove anything that might make you trip as you walk through a door, such as a raised step or threshold. Trim any bushes or trees on the path to your home. Use bright outdoor lighting. Clear any walking paths of anything that might make someone trip, such as rocks or tools. Regularly check to see if handrails are loose or broken. Make sure that both sides of any steps have handrails. Any raised decks and porches should have guardrails on the edges. Have any leaves, snow, or ice cleared regularly. Use sand or salt on walking paths during winter. Clean up any spills in your garage right away. This includes oil or grease spills. What can I do in the bathroom? Use night lights. Install grab bars by the toilet and in the tub and shower. Do not use towel bars as grab bars. Use non-skid mats or decals in the tub or  shower. If you need to sit down in the shower, use a plastic, non-slip stool. Keep the floor dry. Clean up any water that spills on the floor as soon as it happens. Remove soap buildup in the tub or shower regularly. Attach bath mats securely with double-sided non-slip rug tape. Do not have throw rugs and other things on the floor that can make you trip. What can I do in the bedroom? Use night lights. Make sure that you have a light by your bed that is easy to reach. Do not use any sheets or blankets that are too big for your bed. They should not hang down onto the floor. Have a firm chair that has side arms. You can use this for support while you get dressed. Do not have throw rugs and other things on the floor that can make you trip. What can I do in the kitchen? Clean up any spills right away. Avoid walking on wet floors. Keep items that you use a lot in easy-to-reach places. If you need to reach something above you, use a strong step stool that has a grab bar. Keep electrical cords out of the way.  Do not use floor polish or wax that makes floors slippery. If you must use wax, use non-skid floor wax. Do not have throw rugs and other things on the floor that can make you trip. What can I do with my stairs? Do not leave any items on the stairs. Make sure that there are handrails on both sides of the stairs and use them. Fix handrails that are broken or loose. Make sure that handrails are as long as the stairways. Check any carpeting to make sure that it is firmly attached to the stairs. Fix any carpet that is loose or worn. Avoid having throw rugs at the top or bottom of the stairs. If you do have throw rugs, attach them to the floor with carpet tape. Make sure that you have a light switch at the top of the stairs and the bottom of the stairs. If you do not have them, ask someone to add them for you. What else can I do to help prevent falls? Wear shoes that: Do not have high heels. Have  rubber bottoms. Are comfortable and fit you well. Are closed at the toe. Do not wear sandals. If you use a stepladder: Make sure that it is fully opened. Do not climb a closed stepladder. Make sure that both sides of the stepladder are locked into place. Ask someone to hold it for you, if possible. Clearly mark and make sure that you can see: Any grab bars or handrails. First and last steps. Where the edge of each step is. Use tools that help you move around (mobility aids) if they are needed. These include: Canes. Walkers. Scooters. Crutches. Turn on the lights when you go into a dark area. Replace any light bulbs as soon as they burn out. Set up your furniture so you have a clear path. Avoid moving your furniture around. If any of your floors are uneven, fix them. If there are any pets around you, be aware of where they are. Review your medicines with your doctor. Some medicines can make you feel dizzy. This can increase your chance of falling. Ask your doctor what other things that you can do to help prevent falls. This information is not intended to replace advice given to you by your health care provider. Make sure you discuss any questions you have with your health care provider. Document Released: 05/27/2009 Document Revised: 01/06/2016 Document Reviewed: 09/04/2014 Elsevier Interactive Patient Education  2017 [employer].

## 2023-03-26 ENCOUNTER — Other Ambulatory Visit: Payer: Self-pay | Admitting: Family Medicine

## 2023-04-13 ENCOUNTER — Other Ambulatory Visit: Payer: Self-pay | Admitting: Family Medicine

## 2023-04-18 ENCOUNTER — Ambulatory Visit: Payer: Medicare Other | Admitting: Neurology

## 2023-06-27 ENCOUNTER — Other Ambulatory Visit: Payer: Self-pay | Admitting: Family Medicine

## 2023-07-14 ENCOUNTER — Other Ambulatory Visit: Payer: Self-pay | Admitting: Family Medicine

## 2023-07-15 DIAGNOSIS — I82409 Acute embolism and thrombosis of unspecified deep veins of unspecified lower extremity: Secondary | ICD-10-CM

## 2023-07-15 HISTORY — DX: Acute embolism and thrombosis of unspecified deep veins of unspecified lower extremity: I82.409

## 2023-08-02 ENCOUNTER — Encounter: Payer: Self-pay | Admitting: Internal Medicine

## 2023-08-02 ENCOUNTER — Ambulatory Visit: Payer: Medicare Other | Admitting: Internal Medicine

## 2023-08-02 VITALS — BP 110/70 | HR 67 | Temp 98.1°F | Wt 207.8 lb

## 2023-08-02 DIAGNOSIS — R600 Localized edema: Secondary | ICD-10-CM | POA: Diagnosis not present

## 2023-08-02 NOTE — Progress Notes (Signed)
Established Patient Office Visit     CC/Reason for Visit: Left lower extremity swelling  HPI: John Graham is a 77 y.o. male who is coming in today for the above mentioned reasons.  The left lower extremity swelling has been present for about 2 weeks and has been getting worse.  Swelling started around his ankle area and has progressed to close to his knee.  He has pain to palpation of his calf.  No chest pain or shortness of breath.  He smokes about a pack a day.   Past Medical/Surgical History: Past Medical History:  Diagnosis Date   Anxiety    PTSD   HOH (hard of hearing)    no hearing aids   Hypercholesteremia    Hyperlipidemia    Hypertension     Past Surgical History:  Procedure Laterality Date   BRONCHIAL BIOPSY  02/09/2022   Procedure: BRONCHIAL BIOPSIES;  Surgeon: Josephine Igo, DO;  Location: [facility];  Service: Pulmonary;;   BRONCHIAL BRUSHINGS  02/09/2022   Procedure: BRONCHIAL BRUSHINGS;  Surgeon: Josephine Igo, DO;  Location: [facility];  Service: Pulmonary;;   BRONCHIAL NEEDLE ASPIRATION BIOPSY  02/09/2022   Procedure: BRONCHIAL NEEDLE ASPIRATION BIOPSIES;  Surgeon: Josephine Igo, DO;  Location: [facility];  Service: Pulmonary;;   COLONOSCOPY     DENTAL SURGERY     8 dental implants   POLYPECTOMY     VIDEO BRONCHOSCOPY WITH ENDOBRONCHIAL ULTRASOUND Bilateral 02/09/2022   Procedure: VIDEO BRONCHOSCOPY WITH ENDOBRONCHIAL ULTRASOUND;  Surgeon: Josephine Igo, DO;  Location: [facility];  Service: Pulmonary;  Laterality: Bilateral;   VIDEO BRONCHOSCOPY WITH RADIAL ENDOBRONCHIAL ULTRASOUND  02/09/2022   Procedure: VIDEO BRONCHOSCOPY WITH RADIAL ENDOBRONCHIAL ULTRASOUND;  Surgeon: Josephine Igo, DO;  Location: [facility];  Service: Pulmonary;;    Social History:  reports that he has been smoking cigarettes. He has a 53 pack-year smoking history. He has never used smokeless tobacco. He reports that he does not drink alcohol and does not  use drugs.  Allergies: No Known Allergies  Family History:  Family History  Problem Relation Age of Onset   Pancreatic cancer Mother    Cancer Father        Lymph Nodes   Colon cancer Neg Hx      Current Outpatient Medications:    ezetimibe (ZETIA) 10 MG tablet, TAKE 1 TABLET BY MOUTH EVERY DAY, Disp: 90 tablet, Rfl: 0   metoprolol tartrate (LOPRESSOR) 25 MG tablet, Take 1 tablet (25 mg total) by mouth 2 (two) times daily., Disp: 180 tablet, Rfl: 3   Multiple Minerals-Vitamins (CAL MAG ZINC +D3 PO), Take 1 tablet by mouth every evening., Disp: , Rfl:    Multiple Vitamin (MULTIVITAMIN WITH MINERALS) TABS tablet, Take 1 tablet by mouth in the morning. Centrum, Disp: , Rfl:    sertraline (ZOLOFT) 50 MG tablet, TAKE 1 TABLET BY MOUTH EVERY DAY, Disp: 90 tablet, Rfl: 0   simvastatin (ZOCOR) 40 MG tablet, TAKE 1 TABLET BY MOUTH EVERYDAY AT BEDTIME, Disp: 90 tablet, Rfl: 3  Current Facility-Administered Medications:    0.9 %  sodium chloride infusion, 500 mL, Intravenous, Once, Hilarie Fredrickson, MD  Review of Systems:  Negative unless indicated in HPI.   Physical Exam: Vitals:   08/02/23 1139  BP: 110/70  Pulse: 67  Temp: 98.1 F (36.7 C)  TempSrc: Oral  SpO2: 95%  Weight: 207 lb 12.8 oz (94.3 kg)    Body mass index is 28.98  kg/m.   Physical Exam Musculoskeletal:     Right lower leg: Normal.     Left lower leg: Swelling present. 3+ Pitting Edema present.      Impression and Plan:  Edema of left lower extremity -     VAS [place] LOWER EXTREMITY VENOUS (DVT); Future   -Main concern is for a DVT. Check STAT LE doppler.  Time spent:30 minutes reviewing chart, interviewing and examining patient and formulating plan of care.     Chaya Jan, MD [facility]

## 2023-08-03 ENCOUNTER — Other Ambulatory Visit (HOSPITAL_COMMUNITY): Payer: Self-pay

## 2023-08-03 ENCOUNTER — Ambulatory Visit (HOSPITAL_BASED_OUTPATIENT_CLINIC_OR_DEPARTMENT_OTHER)
Admission: RE | Admit: 2023-08-03 | Discharge: 2023-08-03 | Disposition: A | Discharge status: Home / Self Care | Payer: Medicare Other | Source: Ambulatory Visit | Attending: Internal Medicine | Admitting: Internal Medicine

## 2023-08-03 ENCOUNTER — Ambulatory Visit (HOSPITAL_COMMUNITY)
Admission: RE | Admit: 2023-08-03 | Discharge: 2023-08-03 | Disposition: A | Discharge status: Home / Self Care | Payer: Medicare Other | Source: Ambulatory Visit | Attending: Internal Medicine | Admitting: Internal Medicine

## 2023-08-03 VITALS — BP 115/73 | HR 63

## 2023-08-03 DIAGNOSIS — R600 Localized edema: Secondary | ICD-10-CM | POA: Diagnosis not present

## 2023-08-03 DIAGNOSIS — I82412 Acute embolism and thrombosis of left femoral vein: Secondary | ICD-10-CM | POA: Insufficient documentation

## 2023-08-03 LAB — COMPREHENSIVE METABOLIC PANEL
ALT: 17 U/L (ref 0–44)
AST: 20 U/L (ref 15–41)
Albumin: 3.2 g/dL — ABNORMAL LOW (ref 3.5–5.0)
Alkaline Phosphatase: 71 U/L (ref 38–126)
Anion gap: 6 (ref 5–15)
BUN: 14 mg/dL (ref 8–23)
CO2: 28 mmol/L (ref 22–32)
Calcium: 9 mg/dL (ref 8.9–10.3)
Chloride: 102 mmol/L (ref 98–111)
Creatinine, Ser: 0.95 mg/dL (ref 0.61–1.24)
GFR, Estimated: >60 mL/min (ref >60)
Glucose, Bld: 123 mg/dL — ABNORMAL HIGH (ref 70–99)
Potassium: 3.9 mmol/L (ref 3.5–5.1)
Sodium: 136 mmol/L (ref 135–145)
Total Bilirubin: 0.9 mg/dL (ref <1.2)
Total Protein: 6.7 g/dL (ref 6.5–8.1)

## 2023-08-03 LAB — CBC
HCT: 40 % (ref 39.0–52.0)
Hemoglobin: 13.4 g/dL (ref 13.0–17.0)
MCH: 33 pg (ref 26.0–34.0)
MCHC: 33.5 g/dL (ref 30.0–36.0)
MCV: 98.5 fL (ref 80.0–100.0)
Platelets: 214 10*3/uL (ref 150–400)
RBC: 4.06 MIL/uL — ABNORMAL LOW (ref 4.22–5.81)
RDW: 12.6 % (ref 11.5–15.5)
WBC: 8 10*3/uL (ref 4.0–10.5)
nRBC: 0 % (ref 0.0–0.2)

## 2023-08-03 MED ORDER — APIXABAN 5 MG PO TABS: 5.0000 mg | ORAL_TABLET | Freq: Two times a day (BID) | ORAL | 4 refills | Status: DC

## 2023-08-03 MED ORDER — APIXABAN (ELIQUIS) VTE STARTER PACK (10MG AND 5MG)
ORAL_TABLET | ORAL | 0 refills | Status: DC
Start: 2023-08-03 — End: 2023-09-24
  Filled 2023-08-03: qty 74, 30d supply, fill #0

## 2023-08-03 NOTE — Progress Notes (Addendum)
DVT Clinic Note  Name: John Graham     MRN: [id number]     DOB: [birth year]     Sex: male  PCP: Kristian Covey, MD  Today's Visit: Visit Information: Initial Visit  Referred to DVT Clinic by: Primary Care - Dr. Philip Aspen Referred to CPP by: Dr. Myra Gianotti Reason for referral:  Chief Complaint  Patient presents with   DVT   HISTORY OF PRESENT ILLNESS: John Graham is a 77 y.o. male with PMH HTN, HLD, anxiety, non-small cell cancer of right lung s/p completion of treatment in August 2023, who presents for medication management after diagnosis of acute DVT extending from his left common femoral vein through the calf veins today. He presents in a wheelchair today and is accompanied by his daughter Tobi Bastos. He developed left lower extremity swelling 2 weeks ago that has progressively worsened. Swelling started in his left foot and has now progressed to below his knee. Symptoms started about a week after he stopped walking outside. When it was warmer, he was more active with walking but since it's gotten colder he has stayed inside and is significantly less active. He has no history of VTE. No active cancer. No recent unintentional weight loss. No recent travel or injuries. No SOB, chest pain, palpitations. He is not in any pain. Primary complaint is LLE edema.   Positive Thrombotic Risk Factors: Smoking, Older Age, Other (comment) (Acute change in activity immediately preceding symptom onset) Bleeding Risk Factors: Age >65 years  Negative Thrombotic Risk Factors: Previous VTE, Recent surgery (within 3 months), Recent trauma (within 3 months), Recent admission to hospital with acute illness (within 3 months), Central venous catheterization, Pregnancy, Sedentary journey lasting >8 hours within 4 weeks, Paralysis, paresis, or recent plaster cast immobilization of lower extremity, Bed rest >72 hours within 3 months, Within 6 weeks postpartum, Recent cesarean section (within 3 months), Estrogen  therapy, Testosterone therapy, Erythropoiesis-stimulating agent, Recent COVID diagnosis (within 3 months), Active cancer, Non-malignant, chronic inflammatory condition, Known thrombophilic condition, Obesity  Rx Insurance Coverage: Medicare Rx Affordability: Eliquis is $47/month or $141 per 3 months.  Rx Assistance Provided: Free 30-day trial card Preferred Pharmacy: [facility]  Past Medical History:  Diagnosis Date   Anxiety    PTSD   HOH (hard of hearing)    no hearing aids   Hypercholesteremia    Hyperlipidemia    Hypertension     Past Surgical History:  Procedure Laterality Date   BRONCHIAL BIOPSY  02/09/2022   Procedure: BRONCHIAL BIOPSIES;  Surgeon: Josephine Igo, DO;  [facility];  Service: Pulmonary;;   BRONCHIAL BRUSHINGS  02/09/2022   Procedure: BRONCHIAL BRUSHINGS;  Surgeon: Josephine Igo, DO;  [facility];  Service: Pulmonary;;   BRONCHIAL NEEDLE ASPIRATION BIOPSY  02/09/2022   Procedure: BRONCHIAL NEEDLE ASPIRATION BIOPSIES;  Surgeon: Josephine Igo, DO;  [facility]: [facility];  Service: Pulmonary;;   COLONOSCOPY     DENTAL SURGERY     8 dental implants   POLYPECTOMY     VIDEO BRONCHOSCOPY WITH ENDOBRONCHIAL ULTRASOUND Bilateral 02/09/2022   Procedure: VIDEO BRONCHOSCOPY WITH ENDOBRONCHIAL ULTRASOUND;  Surgeon: Josephine Igo, DO;  Location: [facility];  Service: Pulmonary;  Laterality: Bilateral;   VIDEO BRONCHOSCOPY WITH RADIAL ENDOBRONCHIAL ULTRASOUND  02/09/2022   Procedure: VIDEO BRONCHOSCOPY WITH RADIAL ENDOBRONCHIAL ULTRASOUND;  Surgeon: Josephine Igo, DO;  Location: [facility];  Service: Pulmonary;;    Social History   Socioeconomic History   Marital  status: Married    Spouse name: Not on file   Number of children: Not on file   Years of education: Not on file   Highest education level: Not on file  Occupational History   Not on file  Tobacco Use   Smoking status: Every Day    Current  packs/day: 1.00    Average packs/day: 1 pack/day for 53.0 years (53.0 ttl pk-yrs)    Types: Cigarettes   Smokeless tobacco: Never   Tobacco comments:    20 cigarettes smoked daily 02/17/2022 hfb  Vaping Use   Vaping status: Never Used  Substance and Sexual Activity   Alcohol use: No   Drug use: No   Sexual activity: Not on file  Other Topics Concern   Not on file  Social History Narrative   ** Merged History Encounter **    Left handed    Control and instrumentation engineer    Social Drivers of Health   Financial Resource Strain: Low Risk  (03/02/2023)   Overall Financial Resource Strain (CARDIA)    Difficulty of Paying Living Expenses: Not hard at all  Food Insecurity: No Food Insecurity (03/02/2023)   Hunger Vital Sign    Worried About Running Out of Food in the Last Year: Never true    Ran Out of Food in the Last Year: Never true  Transportation Needs: No Transportation Needs (03/02/2023)   PRAPARE - Administrator, Civil Service (Medical): No    Lack of Transportation (Non-Medical): No  Physical Activity: Inactive (03/02/2023)   Exercise Vital Sign    Days of Exercise per Week: 0 days    Minutes of Exercise per Session: 0 min  Stress: No Stress Concern Present (03/02/2023)   [employer] of Occupational Health - Occupational Stress Questionnaire    Feeling of Stress : Not at all  Social Connections: Socially Integrated (03/02/2023)   Social Connection and Isolation Panel [NHANES]    Frequency of Communication with Friends and Family: More than three times a week    Frequency of Social Gatherings with Friends and Family: More than three times a week    Attends Religious Services: More than 4 times per year    Active Member of [employer] or Organizations: Yes    Attends Engineer, structural: More than 4 times per year    Marital Status: Married  Catering manager Violence: Not At Risk (03/02/2023)   Humiliation, Afraid, Rape, and Kick questionnaire    Fear of  Current or Ex-Partner: No    Emotionally Abused: No    Physically Abused: No    Sexually Abused: No    Family History  Problem Relation Age of Onset   Pancreatic cancer Mother    Cancer Father        Lymph Nodes   Colon cancer Neg Hx     Allergies as of 08/03/2023   (No Known Allergies)    Current Outpatient Medications on File Prior to Encounter  Medication Sig Dispense Refill   ezetimibe (ZETIA) 10 MG tablet TAKE 1 TABLET BY MOUTH EVERY DAY 90 tablet 0   metoprolol tartrate (LOPRESSOR) 25 MG tablet Take 1 tablet (25 mg total) by mouth 2 (two) times daily. 180 tablet 3   Multiple Minerals-Vitamins (CAL MAG ZINC +D3 PO) Take 1 tablet by mouth every evening.     Multiple Vitamin (MULTIVITAMIN WITH MINERALS) TABS tablet Take 1 tablet by mouth in the morning. Centrum     sertraline (ZOLOFT) 50 MG tablet  TAKE 1 TABLET BY MOUTH EVERY DAY 90 tablet 0   simvastatin (ZOCOR) 40 MG tablet TAKE 1 TABLET BY MOUTH EVERYDAY AT BEDTIME 90 tablet 3   Current Facility-Administered Medications on File Prior to Encounter  Medication Dose Route Frequency Provider Last Rate Last Admin   0.9 %  sodium chloride infusion  500 mL Intravenous Once Hilarie Fredrickson, MD       REVIEW OF SYSTEMS:  Review of Systems  Respiratory:  Negative for shortness of breath.   Cardiovascular:  Positive for leg swelling. Negative for chest pain and palpitations.  Musculoskeletal:  Negative for myalgias.  Neurological:  Negative for dizziness and tingling.   PHYSICAL EXAMINATION:  Vitals:   08/03/23 1344  BP: 115/73  Pulse: 63  SpO2: 97%   Physical Exam Vitals reviewed.  Cardiovascular:     Rate and Rhythm: Normal rate.  Pulmonary:     Effort: Pulmonary effort is normal.  Musculoskeletal:        General: No tenderness.     Right lower leg: No edema.     Left lower leg: Edema (2 to 3+) present.  Skin:    Findings: Erythema present. No bruising.  Psychiatric:        Mood and Affect: Mood normal.         Behavior: Behavior normal.        Thought Content: Thought content normal.   Villalta Score for Post-Thrombotic Syndrome: Pain: Absent Cramps: Absent Heaviness: Mild Paresthesia: Absent Pruritus: Absent Pretibial Edema: Moderate Hyperpigmentation: Absent Redness: Mild Venous Ectasia: Absent Pain on calf compression: Absent Is venous ulcer present?: No If venous ulcer is present and score is <15, then 15 points total are assigned: Absent  LABS:  CBC     Component Value Date/Time   WBC 8.0 08/03/2023 1347   RBC 4.06 (L) 08/03/2023 1347   HGB 13.4 08/03/2023 1347   HGB 14.5 06/29/2022 1056   HCT 40.0 08/03/2023 1347   PLT 214 08/03/2023 1347   PLT 143 (L) 06/29/2022 1056   MCV 98.5 08/03/2023 1347   MCH 33.0 08/03/2023 1347   MCHC 33.5 08/03/2023 1347   RDW 12.6 08/03/2023 1347   LYMPHSABS 1.0 06/29/2022 1056   MONOABS 0.6 06/29/2022 1056   EOSABS 0.3 06/29/2022 1056   BASOSABS 0.0 06/29/2022 1056    Hepatic Function      Component Value Date/Time   PROT 6.7 08/03/2023 1347   ALBUMIN 3.2 (L) 08/03/2023 1347   AST 20 08/03/2023 1347   AST 17 06/29/2022 1056   ALT 17 08/03/2023 1347   ALT 14 06/29/2022 1056   ALKPHOS 71 08/03/2023 1347   BILITOT 0.9 08/03/2023 1347   BILITOT 0.6 06/29/2022 1056   BILIDIR 0.1 02/23/2023 1524   IBILI 0.6 02/23/2023 1524    Renal Function   Lab Results  Component Value Date   CREATININE 0.95 08/03/2023   CREATININE 1.27 (H) 06/29/2022   CREATININE 1.19 03/02/2022    Estimated Creatinine Clearance: 76.4 mL/min (by C-G formula based on SCr of 0.95 mg/dL).   [facility] Vascular Lab Studies:  08/03/23 VAS [place] LOWER EXTREMITY VENOUS LEFT (DVT) Summary:  RIGHT:  - No evidence of common femoral vein obstruction.   LEFT:  - Findings consistent with acute deep vein thrombosis involving the left  common femoral vein, left femoral vein, left popliteal vein, left  posterior tibial veins, left peroneal veins, and TPT.    ASSESSMENT: Location of DVT: Left common femoral vein, Left femoral vein, Left  popliteal vein, Left distal vein Cause of DVT: provoked by a transient risk factor - in the weeks preceding DVT symptom onset, patient significantly decreased his physical activity, from being active daily, to being primarily sedentary due to the change in weather. He is willing and able to increase his movement back to baseline. In light of this, we can likely anticoagulation for a total of 3-6 months for a first provoked DVT pending increase in physical activity. If he were to develop recurrent DVT, lifelong anticoagulation would be needed.   Due to involvement of the common femoral vein, discussed the patient with Dr. Myra Gianotti. In light of there only being partial occlusion of the left common femoral vein, his mild symptoms, and absence of symptoms above the knee, patient is not a candidate for vascular surgery intervention at this time. If his symptoms don't improve or his swelling becomes bothersome, the patient knows to reach out for reconsideration. Will initiate anticoagulation with Eliquis. Updated CBC and CMET today which have no concerns for starting Eliquis. Patient received the starter pack during the visit today from our onsite pharmacy. Refills have been sent to his preferred pharmacy. No concerns for medication access or affordability at this time. Extensively counseled the patient on his DVT and new medication. Patient and his daughter have no questions or concerns at this time.   PLAN: -Start apixaban (Eliquis) 10 mg twice daily for 7 days followed by 5 mg twice daily. -Expected duration of therapy: 3-6 months pending return to normal levels of mobility. Therapy started on 08/03/23. -Patient educated on purpose, proper use and potential adverse effects of apixaban (Eliquis). -Discussed importance of taking medication around the same time every day. -Advised patient of medications to avoid (NSAIDs, aspirin  doses >100 mg daily). -Educated that Tylenol (acetaminophen) is the preferred analgesic to lower the risk of bleeding. -Advised patient to alert all providers of anticoagulation therapy prior to starting a new medication or having a procedure. -Emphasized importance of monitoring for signs and symptoms of bleeding (abnormal bruising, prolonged bleeding, nose bleeds, bleeding from gums, discolored urine, black tarry stools). -Educated patient to present to the ED if emergent signs and symptoms of new thrombosis occur. -Counseled patient to wear compression stockings daily, removing at night. Elevate legs to help improve swelling.   Follow up: 1 month in DVT Clinic to assess symptom improvement, adherence, and progress with increasing physical activity.  Pervis Hocking, PharmD, [place], CPP Deep Vein Thrombosis Clinic Clinical Pharmacist Practitioner Office: [phone] I have evaluated the patient's chart/imaging and refer this patient to the Clinical Pharmacist Practitioner for medication management. I have reviewed the CPP's documentation and agree with her assessment and plan. I was immediately available during the visit for questions and collaboration.   Durene Cal, MD

## 2023-08-03 NOTE — Patient Instructions (Signed)
-  Start apixaban (Eliquis) 10 mg twice daily for 7 days followed by 5 mg twice daily. -[facility] You may need to call the pharmacy to ask them to fill this when you start to run low on your current supply.  -It is important to take your medication around the same time every day.  -Avoid NSAIDs like ibuprofen (Advil, Motrin) and naproxen (Aleve) as well as aspirin doses over 100 mg daily. -Tylenol (acetaminophen) is the preferred over the counter pain medication to lower the risk of bleeding. -Be sure to alert all of your health care providers that you are taking an anticoagulant prior to starting a new medication or having a procedure. -Monitor for signs and symptoms of bleeding (abnormal bruising, prolonged bleeding, nose bleeds, bleeding from gums, discolored urine, black tarry stools). If you have fallen and hit your head OR if your bleeding is severe or not stopping, seek emergency care.  -Go to the emergency room if emergent signs and symptoms of new clot occur (new or worse swelling and pain in an arm or leg, shortness of breath, chest pain, fast or irregular heartbeats, lightheadedness, dizziness, fainting, coughing up blood) or if you experience a significant color change (pale or blue) in the extremity that has the DVT.  -We recommend you wear compression stockings (20-30 mmHg) as long as you are having swelling or pain. Be sure to purchase the correct size and take them off at night.   Your next visit is on Wednesday January 22 at 1:30PM.  [facility] [address] [place], [place], [place] [address] Enter the hospital through Entrance C off [facility] and pull up to the [facility] entrance to the free valet parking.  Check in for your appointment at the [facility].   If you have any questions or need to reschedule an appointment, please call [phone] [facility].  If you are having an emergency, call 911 or  present to the nearest emergency room.   What is a DVT?  -Deep vein thrombosis (DVT) is a condition in which a blood clot forms in a vein of the deep venous system which can occur in the lower leg, thigh, pelvis, arm, or neck. This condition is serious and can be life-threatening if the clot travels to the arteries of the lungs and causing a blockage (pulmonary embolism, PE). A DVT can also damage veins in the leg, which can lead to long-term venous disease, leg pain, swelling, discoloration, and ulcers or sores (post-thrombotic syndrome).  -Treatment may include taking an anticoagulant medication to prevent more clots from forming and the current clot from growing, wearing compression stockings, and/or surgical procedures to remove or dissolve the clot.

## 2023-09-05 ENCOUNTER — Ambulatory Visit (HOSPITAL_COMMUNITY): Payer: Medicare Other

## 2023-09-05 ENCOUNTER — Other Ambulatory Visit (HOSPITAL_COMMUNITY): Payer: Self-pay

## 2023-09-21 NOTE — Progress Notes (Signed)
 DVT Clinic Note  Name: John Graham     MRN: [id number]     DOB: 11/19/1945     Sex: male  PCP: Marquetta Sit, MD  Today's Visit: Visit Information: Follow Up Visit  Referred to DVT Clinic by: Primary Care - Dr. Zilphia Hilt Referred to CPP by: Dr. Susi Eric Reason for referral:  Chief Complaint  Patient presents with   Med Management - DVT   HISTORY OF PRESENT ILLNESS: John Graham is a 78 y.o. male with PMH HTN, HLD, anxiety, tobacco use, non-small cell cancer of R lung s/p completion of treatment in August 2023, who presents for follow up medication management after diagnosis of DVT extending from his left common femoral vein through the calf veins on 08/03/23. Last seen in DVT Clinic 08/03/23 at which time Eliquis  was initiated. Patient was not a candidate for vascular surgery intervention. Primary complaint was LLE edema, denied pain. DVT felt to be provoked by a sharp decline in activity levels due to colder weather and being significantly more sedentary. No prior history of VTE.   Today patient arrives in a wheelchair and is accompanied by his daughter. Reports that he has not significantly increased his activity back to baseline yet. Still smoking about 1 ppd. Denies abnormal bleeding or bruising. Reports 4 missed doses of Eliquis  while awaiting his last refill at the pharmacy. Denies pain. Has seen some improvement in swelling but has not resolved. Denies wearing compression stockings. Is also not elevating.   Positive Thrombotic Risk Factors: Smoking, Older Age, Other (comment) (Acute change in activity immediately preceding symptom onset) Bleeding Risk Factors: Age >65 years, Anticoagulant therapy  Negative Thrombotic Risk Factors: Previous VTE, Recent surgery (within 3 months), Recent trauma (within 3 months), Recent admission to hospital with acute illness (within 3 months), Paralysis, paresis, or recent plaster cast immobilization of lower extremity, Central venous  catheterization, Bed rest >72 hours within 3 months, Sedentary journey lasting >8 hours within 4 weeks, Pregnancy, Within 6 weeks postpartum, Recent cesarean section (within 3 months), Estrogen therapy, Testosterone therapy, Erythropoiesis-stimulating agent, Recent COVID diagnosis (within 3 months), Active cancer, Non-malignant, chronic inflammatory condition, Known thrombophilic condition, Obesity  Rx Insurance Coverage: Medicare Rx Affordability: $255 deductible + $47 copay (total $302), then $47/month Rx Assistance Provided: Free 30-day trial card provided at initial visit Preferred Pharmacy: Refills available at [facility]  Past Medical History:  Diagnosis Date   Anxiety    PTSD   HOH (hard of hearing)    no hearing aids   Hypercholesteremia    Hyperlipidemia    Hypertension     Past Surgical History:  Procedure Laterality Date   BRONCHIAL BIOPSY  02/09/2022   Procedure: BRONCHIAL BIOPSIES;  Surgeon: Prudy Brownie, DO;  Location: [facility];  Service: Pulmonary;;   BRONCHIAL BRUSHINGS  02/09/2022   Procedure: BRONCHIAL BRUSHINGS;  Surgeon: Prudy Brownie, DO;  Location: [facility];  Service: Pulmonary;;   BRONCHIAL NEEDLE ASPIRATION BIOPSY  02/09/2022   Procedure: BRONCHIAL NEEDLE ASPIRATION BIOPSIES;  Surgeon: Prudy Brownie, DO;  Location: [facility];  Service: Pulmonary;;   COLONOSCOPY     DENTAL SURGERY     8 dental implants   POLYPECTOMY     VIDEO BRONCHOSCOPY WITH ENDOBRONCHIAL ULTRASOUND Bilateral 02/09/2022   Procedure: VIDEO BRONCHOSCOPY WITH ENDOBRONCHIAL ULTRASOUND;  Surgeon: Prudy Brownie, DO;  Location: [facility];  Service: Pulmonary;  Laterality: Bilateral;   VIDEO BRONCHOSCOPY WITH RADIAL ENDOBRONCHIAL ULTRASOUND  02/09/2022   Procedure: VIDEO BRONCHOSCOPY WITH  RADIAL ENDOBRONCHIAL ULTRASOUND;  Surgeon: Prudy Brownie, DO;  Location: [facility];  Service: Pulmonary;;    Social History   Socioeconomic History   Marital status: Married    Spouse name:  Not on file   Number of children: Not on file   Years of education: Not on file   Highest education level: Not on file  Occupational History   Not on file  Tobacco Use   Smoking status: Every Day    Current packs/day: 1.00    Average packs/day: 1 pack/day for 53.0 years (53.0 ttl pk-yrs)    Types: Cigarettes   Smokeless tobacco: Never   Tobacco comments:    20 cigarettes smoked daily 02/17/2022 hfb  Vaping Use   Vaping status: Never Used  Substance and Sexual Activity   Alcohol use: No   Drug use: No   Sexual activity: Not on file  Other Topics Concern   Not on file  Social History Narrative   ** Merged History Encounter **    Left handed    Control and instrumentation engineer    Social Drivers of Health   Financial Resource Strain: Low Risk  (03/02/2023)   Overall Financial Resource Strain (CARDIA)    Difficulty of Paying Living Expenses: Not hard at all  Food Insecurity: No Food Insecurity (03/02/2023)   Hunger Vital Sign    Worried About Running Out of Food in the Last Year: Never true    Ran Out of Food in the Last Year: Never true  Transportation Needs: No Transportation Needs (03/02/2023)   PRAPARE - Administrator, Civil Service (Medical): No    Lack of Transportation (Non-Medical): No  Physical Activity: Inactive (03/02/2023)   Exercise Vital Sign    Days of Exercise per Week: 0 days    Minutes of Exercise per Session: 0 min  Stress: No Stress Concern Present (03/02/2023)   [employer] of Occupational Health - Occupational Stress Questionnaire    Feeling of Stress : Not at all  Social Connections: Socially Integrated (03/02/2023)   Social Connection and Isolation Panel [NHANES]    Frequency of Communication with Friends and Family: More than three times a week    Frequency of Social Gatherings with Friends and Family: More than three times a week    Attends Religious Services: More than 4 times per year    Active Member of [employer] or Organizations: Yes     Attends Engineer, structural: More than 4 times per year    Marital Status: Married  Catering manager Violence: Not At Risk (03/02/2023)   Humiliation, Afraid, Rape, and Kick questionnaire    Fear of Current or Ex-Partner: No    Emotionally Abused: No    Physically Abused: No    Sexually Abused: No    Family History  Problem Relation Age of Onset   Pancreatic cancer Mother    Cancer Father        Lymph Nodes   Colon cancer Neg Hx     Allergies as of 09/24/2023   (No Known Allergies)    Current Outpatient Medications on File Prior to Encounter  Medication Sig Dispense Refill   apixaban  (ELIQUIS ) 5 MG TABS tablet Take 1 tablet (5 mg total) by mouth 2 (two) times daily. Start taking after completion of starter pack. 60 tablet 4   ezetimibe  (ZETIA ) 10 MG tablet TAKE 1 TABLET BY MOUTH EVERY DAY 90 tablet 0   metoprolol  tartrate (LOPRESSOR ) 25 MG tablet Take 1  tablet (25 mg total) by mouth 2 (two) times daily. 180 tablet 3   Multiple Minerals-Vitamins (CAL MAG ZINC +D3 PO) Take 1 tablet by mouth every evening.     Multiple Vitamin (MULTIVITAMIN WITH MINERALS) TABS tablet Take 1 tablet by mouth in the morning. Centrum     sertraline  (ZOLOFT ) 50 MG tablet TAKE 1 TABLET BY MOUTH EVERY DAY 90 tablet 0   simvastatin  (ZOCOR ) 40 MG tablet TAKE 1 TABLET BY MOUTH EVERYDAY AT BEDTIME 90 tablet 3   Current Facility-Administered Medications on File Prior to Encounter  Medication Dose Route Frequency Provider Last Rate Last Admin   0.9 %  sodium chloride  infusion  500 mL Intravenous Once [name], MD       REVIEW OF SYSTEMS:  Review of Systems  Respiratory:  Negative for shortness of breath.   Cardiovascular:  Positive for leg swelling. Negative for chest pain and palpitations.  Musculoskeletal:  Negative for myalgias.  Neurological:  Negative for dizziness and tingling.   PHYSICAL EXAMINATION:  Physical Exam Musculoskeletal:        General: No tenderness.     Right lower leg:  No edema.     Left lower leg: Edema (1+) present.   Villalta Score for Post-Thrombotic Syndrome: Pain: Absent Cramps: Absent Heaviness: Absent Paresthesia: Absent Pruritus: Absent Pretibial Edema: Absent Skin Induration: Absent Hyperpigmentation: Absent Redness: Absent Venous Ectasia: Absent Pain on calf compression: Absent Villalta Preliminary Score: 0 Is venous ulcer present?: No If venous ulcer is present and score is <15, then 15 points total are assigned: Absent Villalta Total Score: 0  LABS:  CBC     Component Value Date/Time   WBC 8.0 08/03/2023 1347   RBC 4.06 (L) 08/03/2023 1347   HGB 13.4 08/03/2023 1347   HGB 14.5 06/29/2022 1056   HCT 40.0 08/03/2023 1347   PLT 214 08/03/2023 1347   PLT 143 (L) 06/29/2022 1056   MCV 98.5 08/03/2023 1347   MCH 33.0 08/03/2023 1347   MCHC 33.5 08/03/2023 1347   RDW 12.6 08/03/2023 1347   LYMPHSABS 1.0 06/29/2022 1056   MONOABS 0.6 06/29/2022 1056   EOSABS 0.3 06/29/2022 1056   BASOSABS 0.0 06/29/2022 1056    Hepatic Function      Component Value Date/Time   PROT 6.7 08/03/2023 1347   ALBUMIN 3.2 (L) 08/03/2023 1347   AST 20 08/03/2023 1347   AST 17 06/29/2022 1056   ALT 17 08/03/2023 1347   ALT 14 06/29/2022 1056   ALKPHOS 71 08/03/2023 1347   BILITOT 0.9 08/03/2023 1347   BILITOT 0.6 06/29/2022 1056   BILIDIR 0.1 02/23/2023 1524   IBILI 0.6 02/23/2023 1524    Renal Function   Lab Results  Component Value Date   CREATININE 0.95 08/03/2023   CREATININE 1.27 (H) 06/29/2022   CREATININE 1.19 03/02/2022    CrCl cannot be calculated (Patient's most recent lab result is older than the maximum 21 days allowed.).   [facility] Vascular Lab Studies:  08/03/23 VAS US  LOWER EXTREMITY VENOUS LEFT (DVT) Summary:  RIGHT:  - No evidence of common femoral vein obstruction.   LEFT:  - Findings consistent with acute deep vein thrombosis involving the left  common femoral vein, left femoral vein, left popliteal vein, left   posterior tibial veins, left peroneal veins, and TPT.  ASSESSMENT: Location of DVT: Left common femoral vein, Left femoral vein, Left popliteal vein, Left distal vein Cause of DVT: provoked by a transient risk factor  Patient with first  incidence of provoked DVT in the setting of recently decreased physical activity, from being active daily, to being primarily sedentary due to change in weather in the weeks immediately preceding DVT symptom onset. Discussed patient with Dr. Charlotte Cookey during his initial visit in DVT Clinic due to extensive DVT and there was no need for vascular surgery intervention. We have planned to anticoagulate for 3-6 months pending return to normal levels of activity.   Patient is tolerating Eliquis  well. No concerns for medication access. They have now paid through their deductible for the year and copays are affordable. LLE edema is significantly improved from baseline, though not back to normal. Encouraged the use of compression and elevating to help with this. He is still sedentary for long periods of the day but is aware he needs to work on increasing his activity again.   PLAN: -Continue apixaban  (Eliquis ) 5 mg twice daily. -Expected duration of therapy: 3-6 months pending return to normal levels of activity. Therapy started on 08/02/24. -Patient educated on purpose, proper use and potential adverse effects of apixaban  (Eliquis ). -Discussed importance of taking medication around the same time every day. -Advised patient of medications to avoid (NSAIDs, aspirin doses >100 mg daily). -Educated that Tylenol  (acetaminophen ) is the preferred analgesic to lower the risk of bleeding. -Advised patient to alert all providers of anticoagulation therapy prior to starting a new medication or having a procedure. -Emphasized importance of monitoring for signs and symptoms of bleeding (abnormal bruising, prolonged bleeding, nose bleeds, bleeding from gums, discolored urine, black tarry  stools). -Educated patient to present to the ED if emergent signs and symptoms of new thrombosis occur. -Counseled patient to wear compression stockings daily, removing at night.  Follow up: End of March for 3 month follow up/possible end of treatment pending increase in activity.   Faye Hoops, PharmD, Becky Bowels, CPP [facility] Clinical Pharmacist Practitioner Office: [phone]

## 2023-09-24 ENCOUNTER — Ambulatory Visit (HOSPITAL_COMMUNITY)
Admission: RE | Admit: 2023-09-24 | Discharge: 2023-09-24 | Disposition: A | Discharge status: Home / Self Care | Payer: Medicare Other | Source: Ambulatory Visit | Attending: Vascular Surgery | Admitting: Vascular Surgery

## 2023-09-24 DIAGNOSIS — I82412 Acute embolism and thrombosis of left femoral vein: Secondary | ICD-10-CM | POA: Diagnosis not present

## 2023-09-24 NOTE — Patient Instructions (Signed)
-  Continue apixaban  (Eliquis ) 5 mg twice daily. -It is important to take your medication around the same time every day.  -Avoid NSAIDs like ibuprofen  (Advil , Motrin ) and naproxen (Aleve) as well as aspirin doses over 100 mg daily. -Tylenol  (acetaminophen ) is the preferred over the counter pain medication to lower the risk of bleeding. -Be sure to alert all of your health care providers that you are taking an anticoagulant prior to starting a new medication or having a procedure. -Monitor for signs and symptoms of bleeding (abnormal bruising, prolonged bleeding, nose bleeds, bleeding from gums, discolored urine, black tarry stools). If you have fallen and hit your head OR if your bleeding is severe or not stopping, seek emergency care.  -Go to the emergency room if emergent signs and symptoms of new clot occur (new or worse swelling and pain in an arm or leg, shortness of breath, chest pain, fast or irregular heartbeats, lightheadedness, dizziness, fainting, coughing up blood) or if you experience a significant color change (pale or blue) in the extremity that has the DVT.  -We recommend you wear compression stockings (20-30 mmHg; if too tight can try 15-20 mmHg) as long as you are having swelling or pain. Be sure to purchase the correct size and take them off at night. Elevate your legs daily to help improve your swelling too.   Your next visit is on March 25 at 2:30pm.  [facility] [address] [place], [place], [place] [address] Enter the hospital through Entrance C off [facility] and pull up to the [facility] entrance to the free valet parking.  Check in for your appointment at the [facility].   If you have any questions or need to reschedule an appointment, please call [phone] [facility].  If you are having an emergency, call 911 or present to the nearest emergency room.   What is a DVT?  -Deep vein thrombosis (DVT) is a condition in which  a blood clot forms in a vein of the deep venous system which can occur in the lower leg, thigh, pelvis, arm, or neck. This condition is serious and can be life-threatening if the clot travels to the arteries of the lungs and causing a blockage (pulmonary embolism, PE). A DVT can also damage veins in the leg, which can lead to long-term venous disease, leg pain, swelling, discoloration, and ulcers or sores (post-thrombotic syndrome).  -Treatment may include taking an anticoagulant medication to prevent more clots from forming and the current clot from growing, wearing compression stockings, and/or surgical procedures to remove or dissolve the clot.

## 2023-09-28 ENCOUNTER — Other Ambulatory Visit: Payer: Self-pay | Admitting: Family Medicine

## 2023-10-10 ENCOUNTER — Telehealth: Payer: Self-pay | Admitting: Family Medicine

## 2023-10-10 NOTE — Telephone Encounter (Signed)
 Copied from CRM [id number]. Topic: Clinical - Medication Question >> Oct 10, 2023  2:20 PM Alvino Blood C wrote: Reason for CRM: Patient states he saw provider Yate for a blood clot and was prescribed apixaban (ELIQUIS) 5 MG TABS tablet . Patient states he was notified by the pharmacy that ezetimibe (ZETIA) 10 MG tablet was ready for pickup and its also a medication used for blood clots. Patient would like a call back at [phone] (H) to confirm if it's ok to take both medications.

## 2023-10-11 NOTE — Telephone Encounter (Signed)
 Left a message for the patient to return my call.

## 2023-10-12 NOTE — Telephone Encounter (Signed)
 Patient's daughter Tobi Bastos informed of the message below

## 2023-10-13 ENCOUNTER — Other Ambulatory Visit: Payer: Self-pay | Admitting: Family Medicine

## 2023-11-06 ENCOUNTER — Ambulatory Visit (HOSPITAL_COMMUNITY): Payer: Medicare Other

## 2023-11-13 ENCOUNTER — Ambulatory Visit (HOSPITAL_COMMUNITY)
Admission: RE | Admit: 2023-11-13 | Discharge: 2023-11-13 | Disposition: A | Discharge status: Home / Self Care | Source: Ambulatory Visit | Attending: Vascular Surgery | Admitting: Vascular Surgery

## 2023-11-13 DIAGNOSIS — I82412 Acute embolism and thrombosis of left femoral vein: Secondary | ICD-10-CM | POA: Diagnosis not present

## 2023-11-13 NOTE — Progress Notes (Addendum)
 DVT Clinic Note  Name: John Graham     MRN: [id number]     DOB: [birth year]     Sex: male  PCP: Kristian Covey, MD  Today's Visit: Visit Information: Follow Up Visit  Referred to DVT Clinic by: Primary Care - Dr. Philip Aspen Referred to CPP by: Dr. Randie Heinz Reason for referral:  Chief Complaint  Patient presents with   Med Management - DVT   HISTORY OF PRESENT ILLNESS: John Graham is a 77 y.o. male with PMH HTN, HLD, anxiety, tobacco use, non-small cell cancer of R lung s/p completion of treatment in August 2023, who presents for follow up medication management after diagnosis of DVT extending from his left common femoral vein through the calf veins on 08/03/23. Initially seen in DVT Clinic 08/03/23 at which time Eliquis was initiated. Patient was not a candidate for vascular surgery intervention. Primary complaint was LLE edema, denied pain. DVT felt to be provoked by a sharp decline in activity levels due to colder weather and being significantly more sedentary. No prior history of VTE. Last seen in DVT Clinic 09/24/23, the plan was continued to complete 3-6 months of anticoagulation pending return to normal activity levels.  Today patient arrives in a wheelchair and is accompanied by his daughter. Reports that his leg is feeling much better. Swelling has significantly improved. Denies abnormal bleeding or bruising. Denies missed doses of Eliquis. Denies wearing compression stockings or elevating legs. Patient tells me that he has still not increased his activity and does not move during the day or leave the house.   Positive Thrombotic Risk Factors: Smoking, Older Age, Other (comment) (Acute change in activity immediately preceding symptom onset) Bleeding Risk Factors: Age >65 years, Anticoagulant therapy  Negative Thrombotic Risk Factors: Previous VTE, Recent surgery (within 3 months), Recent trauma (within 3 months), Recent admission to hospital with acute illness (within 3  months), Paralysis, paresis, or recent plaster cast immobilization of lower extremity, Central venous catheterization, Bed rest >72 hours within 3 months, Sedentary journey lasting >8 hours within 4 weeks, Pregnancy, Within 6 weeks postpartum, Recent cesarean section (within 3 months), Estrogen therapy, Testosterone therapy, Erythropoiesis-stimulating agent, Recent COVID diagnosis (within 3 months), Active cancer, Non-malignant, chronic inflammatory condition, Known thrombophilic condition, Obesity  Rx Insurance Coverage: Medicare Rx Affordability: $255 deductible + $47 copay (total $302), then $47/month  Rx Assistance Provided: Free 30-day trial card Preferred Pharmacy[facility]   Past Medical History:  Diagnosis Date   Anxiety    PTSD   HOH (hard of hearing)    no hearing aids   Hypercholesteremia    Hyperlipidemia    Hypertension     Past Surgical History:  Procedure Laterality Date   BRONCHIAL BIOPSY  02/09/2022   Procedure: BRONCHIAL BIOPSIES;  Surgeon: Josephine Igo, DO;  Location: [facility];  Service: Pulmonary;;   BRONCHIAL BRUSHINGS  02/09/2022   Procedure: BRONCHIAL BRUSHINGS;  Surgeon: Josephine Igo, DO;  Location: [facility];  Service: Pulmonary;;   BRONCHIAL NEEDLE ASPIRATION BIOPSY  02/09/2022   Procedure: BRONCHIAL NEEDLE ASPIRATION BIOPSIES;  Surgeon: Josephine Igo, DO;  Location: [facility];  Service: Pulmonary;;   COLONOSCOPY     DENTAL SURGERY     8 dental implants   POLYPECTOMY     VIDEO BRONCHOSCOPY WITH ENDOBRONCHIAL ULTRASOUND Bilateral 02/09/2022   Procedure: VIDEO BRONCHOSCOPY WITH ENDOBRONCHIAL ULTRASOUND;  Surgeon: Josephine Igo, DO;  Location: [facility];  Service: Pulmonary;  Laterality: Bilateral;   VIDEO  BRONCHOSCOPY WITH RADIAL ENDOBRONCHIAL ULTRASOUND  02/09/2022   Procedure: VIDEO BRONCHOSCOPY WITH RADIAL ENDOBRONCHIAL ULTRASOUND;  Surgeon: Josephine Igo, DO;  Location: [facility];  Service: Pulmonary;;    Social  History   Socioeconomic History   Marital status: Married    Spouse name: Not on file   Number of children: Not on file   Years of education: Not on file   Highest education level: Not on file  Occupational History   Not on file  Tobacco Use   Smoking status: Every Day    Current packs/day: 1.00    Average packs/day: 1 pack/day for 53.0 years (53.0 ttl pk-yrs)    Types: Cigarettes   Smokeless tobacco: Never   Tobacco comments:    20 cigarettes smoked daily 02/17/2022 hfb  Vaping Use   Vaping status: Never Used  Substance and Sexual Activity   Alcohol use: No   Drug use: No   Sexual activity: Not on file  Other Topics Concern   Not on file  Social History Narrative   ** Merged History Encounter **    Left handed    Control and instrumentation engineer    Social Drivers of Health   Financial Resource Strain: Low Risk  (03/02/2023)   Overall Financial Resource Strain (CARDIA)    Difficulty of Paying Living Expenses: Not hard at all  Food Insecurity: No Food Insecurity (03/02/2023)   Hunger Vital Sign    Worried About Running Out of Food in the Last Year: Never true    Ran Out of Food in the Last Year: Never true  Transportation Needs: No Transportation Needs (03/02/2023)   PRAPARE - Administrator, Civil Service (Medical): No    Lack of Transportation (Non-Medical): No  Physical Activity: Inactive (03/02/2023)   Exercise Vital Sign    Days of Exercise per Week: 0 days    Minutes of Exercise per Session: 0 min  Stress: No Stress Concern Present (03/02/2023)   [employer] of Occupational Health - Occupational Stress Questionnaire    Feeling of Stress : Not at all  Social Connections: Socially Integrated (03/02/2023)   Social Connection and Isolation Panel [NHANES]    Frequency of Communication with Friends and Family: More than three times a week    Frequency of Social Gatherings with Friends and Family: More than three times a week    Attends Religious Services:  More than 4 times per year    Active Member of [employer] or Organizations: Yes    Attends Engineer, structural: More than 4 times per year    Marital Status: Married  Catering manager Violence: Not At Risk (03/02/2023)   Humiliation, Afraid, Rape, and Kick questionnaire    Fear of Current or Ex-Partner: No    Emotionally Abused: No    Physically Abused: No    Sexually Abused: No    Family History  Problem Relation Age of Onset   Pancreatic cancer Mother    Cancer Father        Lymph Nodes   Colon cancer Neg Hx     Allergies as of 11/13/2023   (No Known Allergies)    Current Outpatient Medications on File Prior to Encounter  Medication Sig Dispense Refill   apixaban (ELIQUIS) 5 MG TABS tablet Take 1 tablet (5 mg total) by mouth 2 (two) times daily. Start taking after completion of starter pack. 60 tablet 4   ezetimibe (ZETIA) 10 MG tablet TAKE 1 TABLET BY MOUTH EVERY DAY  90 tablet 0   metoprolol tartrate (LOPRESSOR) 25 MG tablet Take 1 tablet (25 mg total) by mouth 2 (two) times daily. 180 tablet 3   Multiple Minerals-Vitamins (CAL MAG ZINC +D3 PO) Take 1 tablet by mouth every evening.     Multiple Vitamin (MULTIVITAMIN WITH MINERALS) TABS tablet Take 1 tablet by mouth in the morning. Centrum     sertraline (ZOLOFT) 50 MG tablet TAKE 1 TABLET BY MOUTH EVERY DAY 90 tablet 0   simvastatin (ZOCOR) 40 MG tablet TAKE 1 TABLET BY MOUTH EVERYDAY AT BEDTIME 90 tablet 3   Current Facility-Administered Medications on File Prior to Encounter  Medication Dose Route Frequency Provider Last Rate Last Admin   0.9 %  sodium chloride infusion  500 mL Intravenous Once Hilarie Fredrickson, MD       REVIEW OF SYSTEMS:  Review of Systems  Respiratory:  Negative for shortness of breath.   Cardiovascular:  Positive for leg swelling. Negative for chest pain and palpitations.  Musculoskeletal:  Negative for myalgias.  Neurological:  Negative for dizziness and tingling.   PHYSICAL EXAMINATION:   Physical Exam Musculoskeletal:        General: No tenderness.     Right lower leg: No edema.     Left lower leg: Edema (trace) present.  Skin:    Findings: No bruising or erythema.   Villalta Score for Post-Thrombotic Syndrome: Pain: Absent Cramps: Absent Heaviness: Absent Paresthesia: Absent Pruritus: Absent Pretibial Edema: Mild Skin Induration: Absent Hyperpigmentation: Absent Redness: Absent Venous Ectasia: Absent Pain on calf compression: Absent Villalta Preliminary Score: 1 Is venous ulcer present?: No If venous ulcer is present and score is <15, then 15 points total are assigned: Absent Villalta Total Score: 1  LABS:  CBC     Component Value Date/Time   WBC 8.0 08/03/2023 1347   RBC 4.06 (L) 08/03/2023 1347   HGB 13.4 08/03/2023 1347   HGB 14.5 06/29/2022 1056   HCT 40.0 08/03/2023 1347   PLT 214 08/03/2023 1347   PLT 143 (L) 06/29/2022 1056   MCV 98.5 08/03/2023 1347   MCH 33.0 08/03/2023 1347   MCHC 33.5 08/03/2023 1347   RDW 12.6 08/03/2023 1347   LYMPHSABS 1.0 06/29/2022 1056   MONOABS 0.6 06/29/2022 1056   EOSABS 0.3 06/29/2022 1056   BASOSABS 0.0 06/29/2022 1056    Hepatic Function      Component Value Date/Time   PROT 6.7 08/03/2023 1347   ALBUMIN 3.2 (L) 08/03/2023 1347   AST 20 08/03/2023 1347   AST 17 06/29/2022 1056   ALT 17 08/03/2023 1347   ALT 14 06/29/2022 1056   ALKPHOS 71 08/03/2023 1347   BILITOT 0.9 08/03/2023 1347   BILITOT 0.6 06/29/2022 1056   BILIDIR 0.1 02/23/2023 1524   IBILI 0.6 02/23/2023 1524    Renal Function   Lab Results  Component Value Date   CREATININE 0.95 08/03/2023   CREATININE 1.27 (H) 06/29/2022   CREATININE 1.19 03/02/2022    CrCl cannot be calculated (Patient's most recent lab result is older than the maximum 21 days allowed.).   [facility] Vascular Lab Studies:  08/03/23 VAS [place] LOWER EXTREMITY VENOUS LEFT (DVT) Summary:  RIGHT:  - No evidence of common femoral vein obstruction.   LEFT:  - Findings  consistent with acute deep vein thrombosis involving the left  common femoral vein, left femoral vein, left popliteal vein, left  posterior tibial veins, left peroneal veins, and TPT.  ASSESSMENT: Location of DVT: Left common femoral  vein, Left femoral vein, Left popliteal vein, Left distal vein Cause of DVT: provoked by a transient risk factor  Patient with first incidence of provoked DVT in the setting of decreased physical activity, from being active daily, to being primarily sedentary due to change in weather in the weeks immediately preceding DVT symptom onset. Discussed patient with Dr. Myra Gianotti during his initial visit in DVT Clinic 08/03/23 due to extensive DVT and there was no need for vascular surgery intervention. We planned to anticoagulate for 3-6 months pending return to normal levels of activity.   The patient has completed 3 months of anticoagulation and seen near resolution of symptoms. Still having some left lower extremity edema but it has improved from his last visit. He is tolerating Eliquis well and had no access issues. Patient has not increased physical activity any since last visit and sits for entirety of his day without getting up. Discussed that we need him to start moving more throughout the day to help lower his risk of future DVT, since his acute increase in sedentary time immediately before DVT symptom onset was felt to be the primary cause of his DVT. Discussed ways that he can break up sedentary time and increase movement. There are no physical limitations to patient's movement. Patient feels motivated build new habits of movement so he can come off Eliquis. His daughter will also help with this. Will follow up in 3 months.   PLAN: -Continue apixaban (Eliquis) 5 mg twice daily. -Expected duration of therapy: 6 months. Therapy started on 08/03/23. -Patient educated on purpose, proper use and potential adverse effects of apixaban (Eliquis). -Discussed importance of  taking medication around the same time every day. -Advised patient of medications to avoid (NSAIDs, aspirin doses >100 mg daily). -Educated that Tylenol (acetaminophen) is the preferred analgesic to lower the risk of bleeding. -Advised patient to alert all providers of anticoagulation therapy prior to starting a new medication or having a procedure. -Emphasized importance of monitoring for signs and symptoms of bleeding (abnormal bruising, prolonged bleeding, nose bleeds, bleeding from gums, discolored urine, black tarry stools). -Educated patient to present to the ED if emergent signs and symptoms of new thrombosis occur. -Counseled patient to wear compression stockings daily, removing at night. Continue elevating legs to help improve swelling.   Follow up: 3 months in DVT Clinic for end of treatment visit  Pervis Hocking, PharmD, BCACP, CPP Deep Vein Thrombosis Clinic Clinical Pharmacist Practitioner  I have evaluated the patient's chart/imaging and refer this patient to the Clinical Pharmacist Practitioner for medication management. I have reviewed the CPP's documentation and agree with her assessment and plan. I was immediately available during the visit for questions and collaboration.   Lemar Livings, MD

## 2023-11-13 NOTE — Patient Instructions (Signed)
-  Continue apixaban (Eliquis) 5 mg twice daily. -[facility]  -It is important to take your medication around the same time every day.  -Avoid NSAIDs like ibuprofen (Advil, Motrin) and naproxen (Aleve) as well as aspirin doses over 100 mg daily. -Tylenol (acetaminophen) is the preferred over the counter pain medication to lower the risk of bleeding. -Be sure to alert all of your health care providers that you are taking an anticoagulant prior to starting a new medication or having a procedure. -Monitor for signs and symptoms of bleeding (abnormal bruising, prolonged bleeding, nose bleeds, bleeding from gums, discolored urine, black tarry stools). If you have fallen and hit your head OR if your bleeding is severe or not stopping, seek emergency care.  -Go to the emergency room if emergent signs and symptoms of new clot occur (new or worse swelling and pain in an arm or leg, shortness of breath, chest pain, fast or irregular heartbeats, lightheadedness, dizziness, fainting, coughing up blood) or if you experience a significant color change (pale or blue) in the extremity that has the DVT.  -We recommend you wear compression stockings (20-30 mmHg) as long as you are having swelling or pain. Be sure to purchase the correct size and take them off at night.   Your next visit is on Tuesday, June 17th at 1:30PM.  [facility] D[facility] Clinic [address] [place], [place], [place] [address] Enter the hospital through Entrance C off [facility] and pull up to the Heart & Vascular Center entrance to the free valet parking.  Check in for your appointment at the Heart & Vascular Center.   **As of December 10, 2023, our location is changing** We will be located at [facility] at:  [address] 4th Floor, [place], [place] [address] Phone number as of 12/10/23 will be [phone].   If you have any questions or need to reschedule an appointment, please  call [phone] [facility] (active number until 12/10/23).  If you are having an emergency, call 911 or present to the nearest emergency room.   What is a DVT?  -Deep vein thrombosis (DVT) is a condition in which a blood clot forms in a vein of the deep venous system which can occur in the lower leg, thigh, pelvis, arm, or neck. This condition is serious and can be life-threatening if the clot travels to the arteries of the lungs and causing a blockage (pulmonary embolism, PE). A DVT can also damage veins in the leg, which can lead to long-term venous disease, leg pain, swelling, discoloration, and ulcers or sores (post-thrombotic syndrome).  -Treatment may include taking an anticoagulant medication to prevent more clots from forming and the current clot from growing, wearing compression stockings, and/or surgical procedures to remove or dissolve the clot.

## 2023-11-13 NOTE — Addendum Note (Signed)
 Encounter addended by: Maeola Harman, MD on: 11/13/2023 4:11 PM  Actions taken: Clinical Note Signed

## 2023-12-21 NOTE — Progress Notes (Deleted)
 NEUROLOGY FOLLOW UP OFFICE NOTE  John Graham [id number]  Assessment/Plan:   Parkinsonian tremor - he continues to not exhibited other symptoms on exam to establish definite diagnosis of Parkinson's disease.  We discussed a carbidopa-levodopa trial, although it may not always treat tremor.  He does not think the tremors hinder his quality of life enough to want to start medication.  Will continue to monitor for now.  Follow up in one year or as needed.  Total time spent reviewing chart and face to face with patient in examination and discussing diagnosis and plan:  ***  Subjective:  John Graham is a 78 year old male with HTN, HLD, and PTSD who follows up for Parkinsonian tremor.  Previously seen by my colleague, Dr. Winferd Hatter, in June 2023.  Her note reviewed.  He is accompanied by his daughter who also supplements history.  He has had a pill rolling tremor since at least 5 years, involving both hands.  Notices it spontaneously at rest and while writing.  Not aggravated by stress or fatigue.  Does not drink alcohol or caffeinated beverages.  Reports trouble buttoning clothes and sometimes getting out of a chair.  Notes loss of smell and taste.  Some mild short-term memory difficulty but independent.  No double vision, lightheadedness, incontinence, or difficulty swallowing.  No history consistent with REM behavioral sleep disorder.  No family history of tremor.  Evaluated by Dr. Ivette Marks Tat, movement disorder specialist, in June 2023.  At that time he demonstrated Parkinsonian tremor but no other clear signs of PD that would satisfy criteria for formal diagnosis of Parkinson's disease.  Recommended monitoring.    PAST MEDICAL HISTORY: Past Medical History:  Diagnosis Date   Anxiety    PTSD   HOH (hard of hearing)    no hearing aids   Hypercholesteremia    Hyperlipidemia    Hypertension     MEDICATIONS: Current Outpatient Medications on File Prior to Visit  Medication Sig Dispense  Refill   apixaban  (ELIQUIS ) 5 MG TABS tablet Take 1 tablet (5 mg total) by mouth 2 (two) times daily. Start taking after completion of starter pack. 60 tablet 4   ezetimibe  (ZETIA ) 10 MG tablet TAKE 1 TABLET BY MOUTH EVERY DAY 90 tablet 0   metoprolol  tartrate (LOPRESSOR ) 25 MG tablet Take 1 tablet (25 mg total) by mouth 2 (two) times daily. 180 tablet 3   Multiple Minerals-Vitamins (CAL MAG ZINC +D3 PO) Take 1 tablet by mouth every evening.     Multiple Vitamin (MULTIVITAMIN WITH MINERALS) TABS tablet Take 1 tablet by mouth in the morning. Centrum     sertraline  (ZOLOFT ) 50 MG tablet TAKE 1 TABLET BY MOUTH EVERY DAY 90 tablet 0   simvastatin  (ZOCOR ) 40 MG tablet TAKE 1 TABLET BY MOUTH EVERYDAY AT BEDTIME 90 tablet 3   Current Facility-Administered Medications on File Prior to Visit  Medication Dose Route Frequency Provider Last Rate Last Admin   0.9 %  sodium chloride  infusion  500 mL Intravenous Once [name], MD        ALLERGIES: No Known Allergies  FAMILY HISTORY: Family History  Problem Relation Age of Onset   Pancreatic cancer Mother    Cancer Father        Lymph Nodes   Colon cancer Neg Hx       Objective:  *** General: No acute distress.  Patient appears well-groomed.   Head:  Normocephalic/atraumatic Eyes:  Fundi examined but not visualized Neck:  supple, no paraspinal tenderness, full range of motion Heart:  Regular rate and rhythm Neurological Exam: Alert and oriented.  Speech fluent and not dysarthric.  Language intact.  Slight right sided lower facial weakness.  Decreased hearing bilaterally.  Otherwise, CN II-XII intact.  Bulk and tone normal.  No rigidity or bradykinesia.  Right hand resting tremor.  No postural or kinetic tremor noted.  Muscle strength 5/5 throughout.  Sensation to light touch intact.  Deep tendon reflexes 2_ throughout.  Finger to nose testing intact.  Gait with upright posture and wide based but no shuffling.  Romberg with sway.   Janne Members, DO  CC: John Lamy, MD

## 2023-12-24 ENCOUNTER — Ambulatory Visit: Payer: Medicare Other | Admitting: Neurology

## 2024-01-10 ENCOUNTER — Other Ambulatory Visit: Payer: Self-pay | Admitting: Family Medicine

## 2024-01-28 NOTE — Progress Notes (Deleted)
 DVT Clinic Note  Name: John Graham     MRN: [id number]     DOB: 04-15-1946     Sex: male  PCP: Marquetta Sit, MD  Today's Visit:    Referred to DVT Clinic by: Primary Care - Dr. Zilphia Hilt Referred to CPP by: {CPP Referral Provider:[id number]} Reason for referral: No chief complaint on file.  HISTORY OF PRESENT ILLNESS: John Graham is a 78 y.o. male with PMH HTN, HLD, anxiety, tobacco use, non-small cell cancer of R lung s/p completion of treatment in August 2023, who presents for follow up medication management after diagnosis of DVT extending from his left common femoral vein through the calf veins on 08/03/23. Initially seen in DVT Clinic 08/03/23 at which time Eliquis  was initiated. Patient was not a candidate for vascular surgery intervention. Primary complaint was LLE edema, denied pain. DVT felt to be provoked by a sharp decline in activity levels due to colder weather and being significantly more sedentary. No prior history of VTE. We planned to complete 3-6 months of anticoagulation pending return to normal activity levels. At his 3 month follow up on 11/13/23 patient denied increasing his activity and we planned to treat to 6 months to allow for more work to be done in that area.   Today, patient reports ***. *** abnormal bleeding or bruising. *** missed doses of ***. *** wearing compression stockings.           Rx Insurance Coverage: Medicare Rx Affordability: $255 deductible + $47 copay (total $302), then $47/month  Rx Assistance Provided: Free 30-day trial card Preferred Pharmacy: Refills at [facility]   Past Medical History:  Diagnosis Date   Anxiety    PTSD   HOH (hard of hearing)    no hearing aids   Hypercholesteremia    Hyperlipidemia    Hypertension     Past Surgical History:  Procedure Laterality Date   BRONCHIAL BIOPSY  02/09/2022   Procedure: BRONCHIAL BIOPSIES;  Surgeon: Prudy Brownie, DO;  Location: [facility];  Service: Pulmonary;;   BRONCHIAL  BRUSHINGS  02/09/2022   Procedure: BRONCHIAL BRUSHINGS;  Surgeon: Prudy Brownie, DO;  Location: [facility];  Service: Pulmonary;;   BRONCHIAL NEEDLE ASPIRATION BIOPSY  02/09/2022   Procedure: BRONCHIAL NEEDLE ASPIRATION BIOPSIES;  Surgeon: Prudy Brownie, DO;  Location: [facility];  Service: Pulmonary;;   COLONOSCOPY     DENTAL SURGERY     8 dental implants   POLYPECTOMY     VIDEO BRONCHOSCOPY WITH ENDOBRONCHIAL ULTRASOUND Bilateral 02/09/2022   Procedure: VIDEO BRONCHOSCOPY WITH ENDOBRONCHIAL ULTRASOUND;  Surgeon: Prudy Brownie, DO;  Location: [facility];  Service: Pulmonary;  Laterality: Bilateral;   VIDEO BRONCHOSCOPY WITH RADIAL ENDOBRONCHIAL ULTRASOUND  02/09/2022   Procedure: VIDEO BRONCHOSCOPY WITH RADIAL ENDOBRONCHIAL ULTRASOUND;  Surgeon: Prudy Brownie, DO;  Location: [facility];  Service: Pulmonary;;    Social History   Socioeconomic History   Marital status: Married    Spouse name: Not on file   Number of children: Not on file   Years of education: Not on file   Highest education level: Not on file  Occupational History   Not on file  Tobacco Use   Smoking status: Every Day    Current packs/day: 1.00    Average packs/day: 1 pack/day for 53.0 years (53.0 ttl pk-yrs)    Types: Cigarettes   Smokeless tobacco: Never   Tobacco comments:    20 cigarettes smoked daily 02/17/2022 hfb  Vaping Use  Vaping status: Never Used  Substance and Sexual Activity   Alcohol use: No   Drug use: No   Sexual activity: Not on file  Other Topics Concern   Not on file  Social History Narrative   ** Merged History Encounter **    Left handed    Control and instrumentation engineer    Social Drivers of Health   Financial Resource Strain: Low Risk  (03/02/2023)   Overall Financial Resource Strain (CARDIA)    Difficulty of Paying Living Expenses: Not hard at all  Food Insecurity: No Food Insecurity (03/02/2023)   Hunger Vital Sign    Worried About Running Out of Food in the Last  Year: Never true    Ran Out of Food in the Last Year: Never true  Transportation Needs: No Transportation Needs (03/02/2023)   PRAPARE - Administrator, Civil Service (Medical): No    Lack of Transportation (Non-Medical): No  Physical Activity: Inactive (03/02/2023)   Exercise Vital Sign    Days of Exercise per Week: 0 days    Minutes of Exercise per Session: 0 min  Stress: No Stress Concern Present (03/02/2023)   [employer] of Occupational Health - Occupational Stress Questionnaire    Feeling of Stress : Not at all  Social Connections: Socially Integrated (03/02/2023)   Social Connection and Isolation Panel    Frequency of Communication with Friends and Family: More than three times a week    Frequency of Social Gatherings with Friends and Family: More than three times a week    Attends Religious Services: More than 4 times per year    Active Member of [employer] or Organizations: Yes    Attends Engineer, structural: More than 4 times per year    Marital Status: Married  Catering manager Violence: Not At Risk (03/02/2023)   Humiliation, Afraid, Rape, and Kick questionnaire    Fear of Current or Ex-Partner: No    Emotionally Abused: No    Physically Abused: No    Sexually Abused: No    Family History  Problem Relation Age of Onset   Pancreatic cancer Mother    Cancer Father        Lymph Nodes   Colon cancer Neg Hx     Allergies as of 01/29/2024   (No Known Allergies)    Current Outpatient Medications on File Prior to Visit  Medication Sig Dispense Refill   apixaban  (ELIQUIS ) 5 MG TABS tablet Take 1 tablet (5 mg total) by mouth 2 (two) times daily. Start taking after completion of starter pack. 60 tablet 4   ezetimibe  (ZETIA ) 10 MG tablet TAKE 1 TABLET BY MOUTH EVERY DAY 90 tablet 0   metoprolol  tartrate (LOPRESSOR ) 25 MG tablet Take 1 tablet (25 mg total) by mouth 2 (two) times daily. 180 tablet 3   Multiple Minerals-Vitamins (CAL MAG ZINC +D3 PO) Take 1  tablet by mouth every evening.     Multiple Vitamin (MULTIVITAMIN WITH MINERALS) TABS tablet Take 1 tablet by mouth in the morning. Centrum     sertraline  (ZOLOFT ) 50 MG tablet TAKE 1 TABLET BY MOUTH EVERY DAY 90 tablet 0   simvastatin  (ZOCOR ) 40 MG tablet TAKE 1 TABLET BY MOUTH EVERYDAY AT BEDTIME 90 tablet 3   Current Facility-Administered Medications on File Prior to Visit  Medication Dose Route Frequency Provider Last Rate Last Admin   0.9 %  sodium chloride  infusion  500 mL Intravenous Once [name], MD  REVIEW OF SYSTEMS:  ROS PHYSICAL EXAMINATION:  There were no vitals filed for this visit.  There is no height or weight on file to calculate BMI.  Physical Exam Villalta Score for Post-Thrombotic Syndrome:    LABS:  CBC     Component Value Date/Time   WBC 8.0 08/03/2023 1347   RBC 4.06 (L) 08/03/2023 1347   HGB 13.4 08/03/2023 1347   HGB 14.5 06/29/2022 1056   HCT 40.0 08/03/2023 1347   PLT 214 08/03/2023 1347   PLT 143 (L) 06/29/2022 1056   MCV 98.5 08/03/2023 1347   MCH 33.0 08/03/2023 1347   MCHC 33.5 08/03/2023 1347   RDW 12.6 08/03/2023 1347   LYMPHSABS 1.0 06/29/2022 1056   MONOABS 0.6 06/29/2022 1056   EOSABS 0.3 06/29/2022 1056   BASOSABS 0.0 06/29/2022 1056    Hepatic Function      Component Value Date/Time   PROT 6.7 08/03/2023 1347   ALBUMIN 3.2 (L) 08/03/2023 1347   AST 20 08/03/2023 1347   AST 17 06/29/2022 1056   ALT 17 08/03/2023 1347   ALT 14 06/29/2022 1056   ALKPHOS 71 08/03/2023 1347   BILITOT 0.9 08/03/2023 1347   BILITOT 0.6 06/29/2022 1056   BILIDIR 0.1 02/23/2023 1524   IBILI 0.6 02/23/2023 1524    Renal Function   Lab Results  Component Value Date   CREATININE 0.95 08/03/2023   CREATININE 1.27 (H) 06/29/2022   CREATININE 1.19 03/02/2022    CrCl cannot be calculated (Patient's most recent lab result is older than the maximum 21 days allowed.).   VVS Vascular Lab Studies:  08/03/23 VAS US  LOWER EXTREMITY VENOUS LEFT  (DVT) Summary:  RIGHT:  - No evidence of common femoral vein obstruction.   LEFT:  - Findings consistent with acute deep vein thrombosis involving the left  common femoral vein, left femoral vein, left popliteal vein, left  posterior tibial veins, left peroneal veins, and TPT.  ASSESSMENT:    Patient with first incidence of provoked DVT in the setting of decreased physical activity, from being active daily, to being primarily sedentary due to change in weather in the weeks immediately preceding DVT symptom onset. Discussed patient with Dr. Charlotte Cookey during his initial visit in DVT Clinic 08/03/23 due to extensive DVT and there was no need for vascular surgery intervention. We planned to anticoagulate for 3-6 months pending return to normal levels of activity.   PLAN: {DVT Clinic OZDG:64403}  Patient is discharged from the DVT Clinic:  {DVT CLINIC DISCHARGE INFO:28390} Follow up: No further follow up needed in DVT Clinic.  Faye Hoops, PharmD, [place], CPP Deep Vein Thrombosis Clinic Clinical Pharmacist Practitioner [phone]

## 2024-01-29 ENCOUNTER — Ambulatory Visit: Admitting: Student-PharmD

## 2024-02-17 ENCOUNTER — Other Ambulatory Visit: Payer: Self-pay | Admitting: Family Medicine

## 2024-03-05 ENCOUNTER — Telehealth: Payer: Self-pay

## 2024-03-05 NOTE — Telephone Encounter (Signed)
 Unsuccessful attempts to reach patient on preferred number listed in notes for scheduled AWV Unable to leave message.

## 2024-03-25 ENCOUNTER — Other Ambulatory Visit: Payer: Self-pay | Admitting: Family Medicine

## 2024-04-28 ENCOUNTER — Other Ambulatory Visit: Payer: Self-pay | Admitting: Family Medicine

## 2024-05-22 ENCOUNTER — Other Ambulatory Visit: Payer: Self-pay | Admitting: Family Medicine

## 2024-05-24 ENCOUNTER — Other Ambulatory Visit: Payer: Self-pay | Admitting: Family Medicine

## 2024-06-20 ENCOUNTER — Other Ambulatory Visit: Payer: Self-pay | Admitting: Family Medicine

## 2024-06-26 ENCOUNTER — Telehealth: Payer: Self-pay

## 2024-06-26 ENCOUNTER — Other Ambulatory Visit: Payer: Self-pay | Admitting: Family Medicine

## 2024-06-26 NOTE — Telephone Encounter (Signed)
 Patient is overdue for an appointment. LMOM for patient to call and schedule.

## 2024-06-29 ENCOUNTER — Other Ambulatory Visit: Payer: Self-pay | Admitting: Family Medicine

## 2024-07-01 NOTE — Telephone Encounter (Signed)
 Called patient to schedule with PCP. Left message for patient to call back

## 2024-07-08 ENCOUNTER — Telehealth: Payer: Self-pay | Admitting: Family Medicine

## 2024-07-08 NOTE — Telephone Encounter (Signed)
 Lmom asking pt to callback to sch

## 2024-07-08 NOTE — Telephone Encounter (Signed)
-----   Message from Jon VEAR Lindau sent at 07/08/2024 10:10 AM EST ----- Hello,  Patient is past due for follow up with PCP, has not been seen by PCP in over a year for routine follow up. Needs an appt for further medication refills per PCP.  Can someone outreach patient and attempt to get scheduled?  Thank you! Jon VEAR Lindau, PharmD Clinical Pharmacist [phone]

## 2024-07-17 NOTE — Telephone Encounter (Signed)
 Lmom asking pt to callback to sch an appt

## 2024-07-23 ENCOUNTER — Other Ambulatory Visit: Payer: Self-pay | Admitting: Family Medicine

## 2024-07-28 ENCOUNTER — Other Ambulatory Visit: Payer: Self-pay

## 2024-07-28 ENCOUNTER — Telehealth: Payer: Self-pay | Admitting: *Deleted

## 2024-07-28 ENCOUNTER — Telehealth: Payer: Self-pay | Admitting: Neurology

## 2024-07-28 ENCOUNTER — Emergency Department (HOSPITAL_COMMUNITY): Admission: EM | Admit: 2024-07-28 | Discharge: 2024-07-29 | Disposition: A | Discharge status: Skilled Nursing Facility

## 2024-07-28 ENCOUNTER — Encounter (HOSPITAL_COMMUNITY): Payer: Self-pay | Admitting: Emergency Medicine

## 2024-07-28 ENCOUNTER — Emergency Department (HOSPITAL_COMMUNITY)

## 2024-07-28 DIAGNOSIS — S8002XA Contusion of left knee, initial encounter: Secondary | ICD-10-CM | POA: Diagnosis not present

## 2024-07-28 DIAGNOSIS — Z7901 Long term (current) use of anticoagulants: Secondary | ICD-10-CM | POA: Diagnosis not present

## 2024-07-28 DIAGNOSIS — W19XXXA Unspecified fall, initial encounter: Secondary | ICD-10-CM | POA: Diagnosis not present

## 2024-07-28 DIAGNOSIS — R17 Unspecified jaundice: Secondary | ICD-10-CM

## 2024-07-28 DIAGNOSIS — G20C Parkinsonism, unspecified: Secondary | ICD-10-CM | POA: Diagnosis not present

## 2024-07-28 DIAGNOSIS — M25562 Pain in left knee: Secondary | ICD-10-CM | POA: Diagnosis present

## 2024-07-28 LAB — CBC
HCT: 38.7 % — ABNORMAL LOW (ref 39.0–52.0)
Hemoglobin: 12.9 g/dL — ABNORMAL LOW (ref 13.0–17.0)
MCH: 31.5 pg (ref 26.0–34.0)
MCHC: 33.3 g/dL (ref 30.0–36.0)
MCV: 94.6 fL (ref 80.0–100.0)
Platelets: 158 K/uL (ref 150–400)
RBC: 4.09 MIL/uL — ABNORMAL LOW (ref 4.22–5.81)
RDW: 13.6 % (ref 11.5–15.5)
WBC: 8.2 K/uL (ref 4.0–10.5)
nRBC: 0 % (ref 0.0–0.2)

## 2024-07-28 LAB — COMPREHENSIVE METABOLIC PANEL WITH GFR
ALT: 11 U/L (ref 0–44)
AST: 17 U/L (ref 15–41)
Albumin: 3.2 g/dL — ABNORMAL LOW (ref 3.5–5.0)
Alkaline Phosphatase: 93 U/L (ref 38–126)
Anion gap: 8 (ref 5–15)
BUN: 20 mg/dL (ref 8–23)
CO2: 27 mmol/L (ref 22–32)
Calcium: 8.5 mg/dL — ABNORMAL LOW (ref 8.9–10.3)
Chloride: 101 mmol/L (ref 98–111)
Creatinine, Ser: 1.13 mg/dL (ref 0.61–1.24)
GFR, Estimated: >60 mL/min (ref >60)
Glucose, Bld: 97 mg/dL (ref 70–99)
Potassium: 4.3 mmol/L (ref 3.5–5.1)
Sodium: 136 mmol/L (ref 135–145)
Total Bilirubin: 2.5 mg/dL — ABNORMAL HIGH (ref 0.0–1.2)
Total Protein: 6.8 g/dL (ref 6.5–8.1)

## 2024-07-28 MED ORDER — METOPROLOL TARTRATE 25 MG PO TABS
25.0000 mg | ORAL_TABLET | Freq: Two times a day (BID) | ORAL | Status: DC
  Administered 2024-07-28 – 2024-07-29 (×2): 25 mg via ORAL
  Filled 2024-07-28 (×3): qty 1

## 2024-07-28 MED ORDER — ADULT MULTIVITAMIN W/MINERALS CH
1.0000 | ORAL_TABLET | Freq: Every morning | ORAL | Status: DC
  Administered 2024-07-29: 08:00:00 1 via ORAL
  Filled 2024-07-28: qty 1

## 2024-07-28 MED ORDER — ACETAMINOPHEN 325 MG PO TABS
650.0000 mg | ORAL_TABLET | Freq: Once | ORAL | Status: AC
  Administered 2024-07-28: 09:00:00 650 mg via ORAL
  Filled 2024-07-28: qty 2

## 2024-07-28 MED ORDER — APIXABAN 5 MG PO TABS
5.0000 mg | ORAL_TABLET | Freq: Two times a day (BID) | ORAL | Status: DC
  Administered 2024-07-28 – 2024-07-29 (×2): 5 mg via ORAL
  Filled 2024-07-28 (×3): qty 1

## 2024-07-28 MED ORDER — CARBIDOPA-LEVODOPA 10-100 MG PO TABS
1.0000 | ORAL_TABLET | Freq: Three times a day (TID) | ORAL | Status: DC
  Administered 2024-07-28 – 2024-07-29 (×3): 1 via ORAL
  Filled 2024-07-28 (×4): qty 1

## 2024-07-28 MED ORDER — SIMVASTATIN 20 MG PO TABS
40.0000 mg | ORAL_TABLET | Freq: Every day | ORAL | Status: DC
  Administered 2024-07-28 – 2024-07-29 (×2): 40 mg via ORAL
  Filled 2024-07-28 (×2): qty 2

## 2024-07-28 MED ORDER — SERTRALINE HCL 50 MG PO TABS
50.0000 mg | ORAL_TABLET | Freq: Every day | ORAL | Status: DC
  Administered 2024-07-28 – 2024-07-29 (×2): 50 mg via ORAL
  Filled 2024-07-28 (×2): qty 1

## 2024-07-28 MED ORDER — EZETIMIBE 10 MG PO TABS
10.0000 mg | ORAL_TABLET | Freq: Every day | ORAL | Status: DC
  Administered 2024-07-28 – 2024-07-29 (×2): 10 mg via ORAL
  Filled 2024-07-28 (×3): qty 1

## 2024-07-28 NOTE — NC FL2 (Signed)
 [place]  MEDICAID FL2 LEVEL OF CARE FORM     IDENTIFICATION  Patient Name: JONHATAN HEARTY Birthdate: [birth year] Sex: male Admission Date (Current Location): 07/28/2024  [facility] and [place] Number:  Producer, Television/film/video and Address:  [facility], [address] [place], [place] [address]      Provider Number: [id number]  Attending Physician Name and Address:  Neysa Caron PARAS, DO  Relative Name and Phone Number:  [facility]  Daughter, [phone] [facility])    Current Level of Care: Hospital Recommended Level of Care: Skilled Nursing Facility Prior Approval Number:    Date Approved/Denied:   PASRR Number: [id number]  Discharge Plan: SNF    Current Diagnoses: Patient Active Problem List   Diagnosis Date Noted   Acute deep vein thrombosis (DVT) of femoral vein of left lower extremity (HCC) 08/03/2023   Non-small cell cancer of right lung (HCC) 02/17/2022   Lung nodule 02/07/2022   Bilateral hearing loss 12/27/2021   Localized primary osteoarthritis of carpometacarpal joint of left thumb 01/25/2017   Psoriasiform eruption 02/03/2014   Anxiety state 07/29/2013   Disorder of skin or subcutaneous tissue 12/30/2009   Bilateral primary osteoarthritis of knee 08/23/2009   Hyperlipidemia 03/22/2009   Essential hypertension 03/22/2009   History of colonic polyps 03/22/2009    Orientation RESPIRATION BLADDER Height & Weight     Self, Time, Situation, Place  Normal Continent Weight: 207 lb 14.3 oz (94.3 kg) Height:  5' 11 (180.3 cm)  BEHAVIORAL SYMPTOMS/MOOD NEUROLOGICAL BOWEL NUTRITION STATUS      Continent Diet (see dc summary)  AMBULATORY STATUS COMMUNICATION OF NEEDS Skin   Limited Assist Verbally Normal                       Personal Care Assistance Level of Assistance  Bathing, Feeding, Dressing Bathing Assistance: Limited assistance Feeding assistance: Limited assistance Dressing Assistance: Limited assistance     Functional  Limitations Info  Speech, Hearing, Sight Sight Info: Impaired Hearing Info: Adequate Speech Info: Adequate    SPECIAL CARE FACTORS FREQUENCY  PT (By licensed PT), OT (By licensed OT)     PT Frequency: 5x/wk OT Frequency: 5x/wk            Contractures Contractures Info: Not present    Additional Factors Info  Code Status, Allergies Code Status Info: Full Code Allergies Info: No Known Allergies           Current Medications (07/28/2024):  This is the current hospital active medication list Current Facility-Administered Medications  Medication Dose Route Frequency Provider Last Rate Last Admin   0.9 %  sodium chloride  infusion  500 mL Intravenous Once Abran Norleen SAILOR, MD       Current Outpatient Medications  Medication Sig Dispense Refill   apixaban  (ELIQUIS ) 5 MG TABS tablet Take 1 tablet (5 mg total) by mouth 2 (two) times daily. Start taking after completion of starter pack. 60 tablet 4   ezetimibe  (ZETIA ) 10 MG tablet TAKE 1 TABLET BY MOUTH EVERY DAY 90 tablet 0   metoprolol  tartrate (LOPRESSOR ) 25 MG tablet TAKE 1 TABLET BY MOUTH TWICE A DAY 60 tablet 0   Multiple Minerals-Vitamins (CAL MAG ZINC +D3 PO) Take 1 tablet by mouth every evening.     Multiple Vitamin (MULTIVITAMIN WITH MINERALS) TABS tablet Take 1 tablet by mouth in the morning. Centrum     sertraline  (ZOLOFT ) 50 MG tablet TAKE 1 TABLET BY MOUTH EVERY DAY 30 tablet 0  simvastatin  (ZOCOR ) 40 MG tablet TAKE 1 TABLET BY MOUTH EVERYDAY AT BEDTIME 30 tablet 0     Discharge Medications: Please see discharge summary for a list of discharge medications.  Relevant Imaging Results:  Relevant Lab Results:   Additional Information SSN [id number]  Sheri ONEIDA Sharps, LCSW

## 2024-07-28 NOTE — ED Notes (Signed)
 Attempted to discharge patient. Pt daughter stated no, I want to talk with a doctor. No one has talked to me. Dr. Neysa made aware of patients daughter response to her dad being discharged and that patients daughter would like to speak to her.

## 2024-07-28 NOTE — ED Notes (Signed)
 Pt sleeping at this time. Rise and fall of chest noted. Pt in NAD at this time. Will continue to monitor.

## 2024-07-28 NOTE — Evaluation (Signed)
 Physical Therapy Evaluation Patient Details Name: John Graham MRN: [id number] DOB: 21-Jul-1946 Today's Date: 07/28/2024  History of Present Illness  Patient is a 78 y/o male seen in ED 07/28/24 due to falls at home and L knee pain, imaging negative.  PMH positive for Parkinson's disease, HLD, HTN, arthritis, hearing loss, NCSC R lung, h/o L LE DVT.  Clinical Impression  Patient presents with decreased mobility though his baseline is not fully known except he has been falling lately.  Currently needing mod to max A for bed mobility and mod to min A for sit to stand and side stepping toward HOB.  He declined ambulation though not specific about pain may be fearful.  Patient appropriate for inpatient rehab (<3 hours/day) prior to d/c home.  PT will follow if admitted.         If plan is discharge home, recommend the following: Two people to help with bathing/dressing/bathroom;A lot of help with walking and/or transfers   Can travel by private vehicle   No    Equipment Recommendations None recommended by PT  Recommendations for Other Services       Functional Status Assessment Patient has had a recent decline in their functional status and demonstrates the ability to make significant improvements in function in a reasonable and predictable amount of time.     Precautions / Restrictions Precautions Precautions: Fall Precaution/Restrictions Comments: daughter reports 3 falls in one day      Mobility  Bed Mobility Overal bed mobility: Needs Assistance Bed Mobility: Supine to Sit, Sit to Supine     Supine to sit: Mod assist, HOB elevated, Max assist Sit to supine: Mod assist   General bed mobility comments: assist for legs off EOB and to lift trunk with time; to supine assist for one leg at a time onto bed    Transfers Overall transfer level: Needs assistance Equipment used: Rolling walker (2 wheels) Transfers: Sit to/from Stand Sit to Stand: Min assist, Mod assist            General transfer comment: stood to RW initially some lifting help though pt able to assist a good amount, second attempt with less help    Ambulation/Gait Ambulation/Gait assistance: Min assist, Mod assist Gait Distance (Feet): 2 Feet Assistive device: Rolling walker (2 wheels) Gait Pattern/deviations: Step-to pattern, Wide base of support, Trunk flexed       General Gait Details: first attempt pt when cued to step to chair at the bedside stated he could not and needed to sit back on EOB; second attempt, pt needing assist to side step to [facility] prior to sitting with max cues and encouragement and help to initiate/manage walker  Stairs            Wheelchair Mobility     Tilt Bed    Modified Rankin (Stroke Patients Only)       Balance Overall balance assessment: Needs assistance   Sitting balance-Leahy Scale: Fair     Standing balance support: Bilateral upper extremity supported Standing balance-Leahy Scale: Poor Standing balance comment: UE support though pt able to stand without physical help while assisted for hygiene due to + BM                             Pertinent Vitals/Pain Pain Assessment Pain Assessment: Faces Faces Pain Scale: Hurts little more Pain Location: legs when assisting to move then off the bed, though denied knee pain when  standing Pain Descriptors / Indicators: Discomfort, Grimacing Pain Intervention(s): Monitored during session, Repositioned    Home Living Family/patient expects to be discharged to:: Private residence Living Arrangements: Children;Other relatives (daughter and grandkids) Available Help at Discharge: Family Type of Home: House         Home Layout: One level   Additional Comments: difficult to obtain history (HOH and no family present) and pt did not recollect falls    Prior Function               Mobility Comments: admits to using walker       Extremity/Trunk Assessment   Upper Extremity  Assessment Upper Extremity Assessment: Generalized weakness (reports shoulders hurt when standing)    Lower Extremity Assessment Lower Extremity Assessment: Generalized weakness    Cervical / Trunk Assessment Cervical / Trunk Assessment: Kyphotic  Communication   Communication Communication: Impaired Factors Affecting Communication: Hearing impaired    Cognition Arousal: Lethargic Behavior During Therapy: Agitated   PT - Cognitive impairments: No family/caregiver present to determine baseline, Difficult to assess Difficult to assess due to: Hard of hearing/deaf                     PT - Cognition Comments: did not want to do much but sleep, finally participating some though continued to request to return to supine to sleep Following commands: Impaired Following commands impaired: Follows one step commands inconsistently, Follows one step commands with increased time     Cueing Cueing Techniques: Verbal cues, Gestural cues     General Comments General comments (skin integrity, edema, etc.): VSS during mobility, RN in to help with changing bed linen while standing; pt dissheveled and with hair hanging in his face    Exercises     Assessment/Plan    PT Assessment Patient needs continued PT services  PT Problem List Decreased strength;Decreased balance;Decreased mobility;Decreased knowledge of precautions;Decreased safety awareness;Decreased activity tolerance       PT Treatment Interventions DME instruction;Gait training;Patient/family education;Functional mobility training;Therapeutic activities;Therapeutic exercise;Balance training    PT Goals (Current goals can be found in the Care Plan section)  Acute Rehab PT Goals Patient Stated Goal: unable to state PT Goal Formulation: Patient unable to participate in goal setting Time For Goal Achievement: 08/11/24 Potential to Achieve Goals: Fair    Frequency Min 2X/week     Co-evaluation                AM-PAC PT 6 Clicks Mobility  Outcome Measure Help needed turning from your back to your side while in a flat bed without using bedrails?: A Lot Help needed moving from lying on your back to sitting on the side of a flat bed without using bedrails?: Total Help needed moving to and from a bed to a chair (including a wheelchair)?: A Lot Help needed standing up from a chair using your arms (e.g., wheelchair or bedside chair)?: A Lot Help needed to walk in hospital room?: Total Help needed climbing 3-5 steps with a railing? : Total 6 Click Score: 9    End of Session Equipment Utilized During Treatment: Gait belt Activity Tolerance: Patient limited by fatigue Patient left: in bed   PT Visit Diagnosis: Muscle weakness (generalized) (M62.81);History of falling (Z91.81);Other abnormalities of gait and mobility (R26.89)    Time: 8642-8574 PT Time Calculation (min) (ACUTE ONLY): 28 min   Charges:   PT Evaluation $PT Eval High Complexity: 1 High PT Treatments $Therapeutic Activity: 8-22 mins PT General Charges $$  ACUTE PT VISIT: 1 Visit         John Graham, PT Acute Rehabilitation Services [phone] 07/28/2024   John Graham 07/28/2024, 3:38 PM

## 2024-07-28 NOTE — ED Provider Notes (Addendum)
 [place] EMERGENCY DEPARTMENT AT [facility] Provider Note   CSN: [id number] Arrival date & time: 07/28/24  0411     Patient presents with: John Graham is a 78 y.o. male.   78 year old history of Parkinson's presenting emergency department for falls.  Having left knee pain.  He reports more difficultly ambulating recently.  Reports falling several times over the past month.  Denies headache, chest pain, shortness of breath, abdominal pain, hip pain.  Unsure if he hit his head, he has a poor historian.  I did call his daughter whom patient lives with, she notes that he fell 3 times yesterday.  At his neurologic baseline.   Fall       Prior to Admission medications  Medication Sig Start Date End Date Taking? Authorizing Provider  apixaban  (ELIQUIS ) 5 MG TABS tablet Take 1 tablet (5 mg total) by mouth 2 (two) times daily. Start taking after completion of starter pack. 08/03/23   Barbarann Dixon B, RPH-CPP  ezetimibe  (ZETIA ) 10 MG tablet TAKE 1 TABLET BY MOUTH EVERY DAY 09/28/23   Burchette, Wolm ORN, MD  metoprolol  tartrate (LOPRESSOR ) 25 MG tablet TAKE 1 TABLET BY MOUTH TWICE A DAY 07/23/24   Burchette, Wolm ORN, MD  Multiple Minerals-Vitamins (CAL MAG ZINC +D3 PO) Take 1 tablet by mouth every evening.    [provider]  Multiple Vitamin (MULTIVITAMIN WITH MINERALS) TABS tablet Take 1 tablet by mouth in the morning. Centrum    [provider]  sertraline  (ZOLOFT ) 50 MG tablet TAKE 1 TABLET BY MOUTH EVERY DAY 07/23/24   Burchette, Wolm ORN, MD  simvastatin  (ZOCOR ) 40 MG tablet TAKE 1 TABLET BY MOUTH EVERYDAY AT BEDTIME 04/28/24   Burchette, Wolm ORN, MD    Allergies: Patient has no known allergies.    Review of Systems  Updated Vital Signs BP 123/85   Pulse 86   Temp 98.2 F (36.8 C)   Resp 16   Ht 5' 11 (1.803 m)   Wt 94.3 kg   SpO2 96%   BMI 29.00 kg/m   Physical Exam Vitals and nursing note reviewed.  HENT:     Head:  Normocephalic and atraumatic.     Nose: Nose normal.     Mouth/Throat:     Mouth: Mucous membranes are moist.  Cardiovascular:     Rate and Rhythm: Normal rate and regular rhythm.  Pulmonary:     Effort: Pulmonary effort is normal.     Breath sounds: Normal breath sounds.  Abdominal:     General: Abdomen is flat. There is no distension.     Tenderness: There is no abdominal tenderness. There is no guarding or rebound.  Musculoskeletal:        General: Normal range of motion.     Comments: Some minor tenderness to his left knee, but no other bony tenderness.  5 out of 5 plantarflexion dorsiflexion, grip strength equal.  No midline spinal tenderness.  Chest wall stable nontender.  Pelvis stable nontender.  Skin:    General: Skin is warm and dry.     Capillary Refill: Capillary refill takes less than 2 seconds.  Neurological:     General: No focal deficit present.     Mental Status: He is alert and oriented to person, place, and time.  Psychiatric:        Mood and Affect: Mood normal.        Behavior: Behavior normal.     (all labs ordered  are listed, but only abnormal results are displayed) Labs Reviewed  CBC - Abnormal; Notable for the following components:      Result Value   RBC 4.09 (*)    Hemoglobin 12.9 (*)    HCT 38.7 (*)    All other components within normal limits  COMPREHENSIVE METABOLIC PANEL WITH GFR - Abnormal; Notable for the following components:   Calcium 8.5 (*)    Albumin 3.2 (*)    Total Bilirubin 2.5 (*)    All other components within normal limits  URINALYSIS, ROUTINE W REFLEX MICROSCOPIC    EKG: None  Radiology: CT Head Wo Contrast Result Date: 07/28/2024 EXAM: CT HEAD WITHOUT 07/28/2024 11:58:32 AM TECHNIQUE: CT of the head was performed without the administration of intravenous contrast. Automated exposure control, iterative reconstruction, and/or weight based adjustment of the mA/kV was utilized to reduce the radiation dose to as low as  reasonably achievable. COMPARISON: None available. CLINICAL HISTORY: Head trauma, minor (Age >= 65y) FINDINGS: BRAIN AND VENTRICLES: No acute intracranial hemorrhage. No mass effect or midline shift. No extra-axial fluid collection. Mild to moderate chronic microvascular ischemic changes and generalized parenchymal volume loss. Remote lacunar infarct in the anterior aspect of the right basal ganglia. Atherosclerosis of the carotid siphons and intracranial left vertebral artery. No evidence of acute infarct. No hydrocephalus. ORBITS: No acute abnormality. SINUSES AND MASTOIDS: Scattered mucosal thickening throughout the paranasal sinuses. Mucous retention cyst in the left maxillary sinus. SOFT TISSUES AND SKULL: No acute skull fracture. No acute soft tissue abnormality. IMPRESSION: 1. No acute intracranial abnormality. 2. Remote lacunar infarct in the right basal ganglia. 3. Mild to moderate chronic microvascular ischemic changes and generalized parenchymal volume loss. 4. Scattered paranasal sinus mucosal thickening and a left maxillary sinus mucous retention cyst. Electronically signed by: Donnice Mania MD 07/28/2024 12:15 PM EST RP Workstation: [id number]   DG Chest 2 View Result Date: 07/28/2024 EXAM: 2 VIEW(S) XRAY OF THE CHEST 07/28/2024 09:13:00 AM COMPARISON: Restaging chest CT 06/29/2022 and earlier. CLINICAL HISTORY: 78 year old male with multiple falls and history of lung cancer. FINDINGS: LUNGS AND PLEURA: Stable lung volumes and ventilation. Right upper lobe calcified granuloma. Mild mid right lung architectural distortion appears to be chronic. Right lung base scarring. No pleural effusion. No pneumothorax. HEART AND MEDIASTINUM: Atherosclerotic plaque. Chronic and benign coarsely calcified mediastinal lymph nodes. No acute abnormality of the cardiac and mediastinal silhouettes. BONES AND SOFT TISSUES: Osteopenia. Chronic wedging of lower thoracic vertebrae appears stable. UPPER ABDOMEN: Negative  visible bowel gas. IMPRESSION: 1. No acute cardiopulmonary abnormality. Electronically signed by: Helayne Hurst MD 07/28/2024 09:36 AM EST RP Workstation: [id number]   DG Wrist Complete Right Result Date: 07/28/2024 EXAM: 3 OR MORE VIEW(S) XRAY OF THE RIGHT WRIST 07/28/2024 09:13:00 AM COMPARISON: None available. CLINICAL HISTORY: 78 year old male. Status post multiple falls at home. Nonsmall cell lung cancer. FINDINGS: BONES AND JOINTS: Severe osteoarthritis of the first [facility] with marginal osteophytes and subchondral cyst formation. Indeterminate circumscribed small lucent areas are present in the distal radius, ulnar styloid, and distal carpal bones. Follow up whole body nuclear medicine bone scan may be valuable. SOFT TISSUES: The soft tissues are unremarkable. IMPRESSION: 1. No acute fracture or dislocation identified about the right wrist. 2. Indeterminate small lucent bone lesions in radius and ulna styloid, and carpals. Follow up whole body nuclear medicine bone scan may be valuable given history of lung cancer. 3. Severe first [facility] osteoarthritis. Electronically signed by: Helayne Hurst MD 07/28/2024 09:34 AM EST RP Workstation: [id number]  DG Knee Complete 4 Views Left Result Date: 07/28/2024 EXAM: 4 OR MORE VIEW(S) XRAY OF THE LEFT KNEE 07/28/2024 04:39:27 AM COMPARISON: None available. CLINICAL HISTORY: fall FINDINGS: BONES AND JOINTS: No acute fracture. No malalignment. Small suprapatellar bursal effusion. There is advanced arthrosis, particularly in the medial compartment where there is bone on bone joint space loss with joint surface remodeling. There are moderate tricompartmental marginal osteophytes with partial joint space loss in the patellofemoral and lateral compartments. No dislocation. SOFT TISSUES: There are heavy vascular calcifications. IMPRESSION: 1. No acute fracture or dislocation. 2. Compartmental arthrosis, but most advanced in the medial compartment. 3. Small suprapatellar bursal  effusion. 4. Vascular calcification. Electronically signed by: Francis Quam MD 07/28/2024 05:01 AM EST RP Workstation: [id number]     Procedures   Medications Ordered in the ED  carbidopa -levodopa  (SINEMET  IR) 10-100 MG per tablet immediate release 1 tablet (has no administration in time range)  metoprolol  tartrate (LOPRESSOR ) tablet 25 mg (has no administration in time range)  ezetimibe  (ZETIA ) tablet 10 mg (has no administration in time range)  multivitamin with minerals tablet 1 tablet (has no administration in time range)  sertraline  (ZOLOFT ) tablet 50 mg (has no administration in time range)  simvastatin  (ZOCOR ) tablet 40 mg (has no administration in time range)  apixaban  (ELIQUIS ) tablet 5 mg (has no administration in time range)  acetaminophen  (TYLENOL ) tablet 650 mg (650 mg Oral Given 07/28/24 0912)    Clinical Course as of 07/28/24 1659  Mon Jul 28, 2024  0951 DG Wrist Complete Right IMPRESSION: 1. No acute fracture or dislocation identified about the right wrist. 2. Indeterminate small lucent bone lesions in radius and ulna styloid, and carpals. Follow up whole body nuclear medicine bone scan may be valuable given history of lung cancer. 3. Severe first [facility] osteoarthritis.   [TY]  1037 CBC(!) Mild drop in hemoglobin, but no leukocytosis to suggest infectious process [TY]  1038 Comprehensive metabolic panel(!) [TY]  1038 Comprehensive metabolic panel(!) Elevated T. bili, but not having abdominal pain.  Does not appear to have chronic elevation.  No tenderness on exam.  Liver enzymes also normal.  Unclear significance.  Can defer further evaluation to PCP as I do not think it is related to his fall or causing him to fall [TY]  1229 Patient's workup reassuring.  No metabolic derangements.  CT head without abnormality.  Wrist x-ray with no fracture, but did show some lucencies that can be follow-up outpatient with his primary doctor.  Patient resting comfortably.  I suspect  his frequent falls are secondary to his Parkinson's.  Patient to follow back up with his neurologist.  Will discharge [TY]  1251 After daughter presented to bedside, she does not feel safe taking him home to care for him at home.  She is requesting placement. [TY]  1656 I did discuss with neurology, recommending starting low-dose carbidopa  levodopa , but no need to admit. [TY]  1656 I see no negation for admission at this time.  PT/OT and TOC have been consulted. [TY]    Clinical Course User Index [TY] Neysa Caron PARAS, DO                                 Medical Decision Making 78 year old presented with knee pain after fall.  Afebrile vital signs reassuring.  No overt trauma identified on exam.  Does have some mild tenderness to his left knee.  Discussed discussed presentation with  daughter; see ED course.  Basic labs and CT head ordered.  However, I suspect falls secondary to his Parkinson's.  Will get screening labs and imaging.  See ED course for further MDM final disposition.  Amount and/or Complexity of Data Reviewed External Data Reviewed:     Details: Per his neurologist notes back in 2025 patient declined carbidopa -levodopa  Labs: ordered. Decision-making details documented in ED Course. Radiology: ordered and independent interpretation performed. Decision-making details documented in ED Course.    Details: Do not appreciate obvious intracranial hemorrhage ECG/medicine tests: ordered.  Risk OTC drugs. Prescription drug management.       Final diagnoses:  Fall, initial encounter  Contusion of left knee, initial encounter  Elevated bilirubin    ED Discharge Orders     None          Neysa Caron PARAS, DO 07/28/24 1232    Neysa Caron PARAS, DO 07/28/24 1659

## 2024-07-28 NOTE — ED Notes (Signed)
 Patient transported to X-ray

## 2024-07-28 NOTE — Progress Notes (Signed)
 Awaiting PT eval.

## 2024-07-28 NOTE — Telephone Encounter (Signed)
 Copied from CRM [id number]. Topic: Clinical - Medical Advice >> Jul 28, 2024 10:04 AM Suzen RAMAN wrote: Reason for RMF:Ejupzwu daughter would like a call back to discuss options available for patient when he is discharged from the hospital. Patient daughter concern with father having proper care when he is discharged from the hospital and would like guidance on how to establish needed care for patient. Home health and etc   Adelard Sanon CB# [phone]

## 2024-07-28 NOTE — Progress Notes (Addendum)
 TOC consult for SNF Placement reviewed. FL2 previously completed by 1st shift CSW. PT eval completed, rec less than 3 hours. CSW called and spoke with patient's daughter, John Graham [phone]). Discussed SNF recommendation. John Graham aware of SNF process and agreeable to seek SNF placement from ED setting. States John Graham is best location to base search. States 1st choice for SNF would be [facility]. Knows she does not want patient to go to [facility] or [facility]. No further preferences at this time. Shares patient's spouse is in [place] and there is some ongoing conflict with the facility. We discussed next steps and prominent barriers, including time of day and insurance coverage.   Referrals sent. Await responses. Plan to follow up w/ John Graham later in the night.   6:58 - John Graham has offered bed for admission tomorrow. Approval to move forward with bed and auth obtained via phone call with daughter John Graham. Auth submitted via [employer], approved for [facility] 12/16. NRD 12/18. Auth ID# [id number]. Discussed with John Graham, plan for transport via [facility] in AM. Nursing and attending updated.

## 2024-07-28 NOTE — Telephone Encounter (Signed)
 I spoke with the patient's daughter and advised the her the patient will need HFU once patient is discharged from ED. [name] reported the patient is currently at ED and patient is having lower extremity weakness. [name] inquired if there is a service available to help with transportation for the patient as she stated she will have a hard time taking the patient to appointments if he cannot stand and walk independently

## 2024-07-28 NOTE — Telephone Encounter (Signed)
 Pt.'s daughter calling as Pt has fallen 3 times within the last few days, Sched Pt but not yet discharged. Daughter would like call bk to go over if appt needs to [facility] or if he could have Rx/Medical device prior to appt to help with balance

## 2024-07-28 NOTE — Discharge Instructions (Addendum)
 May take over-the-counter medications for pain such as Tylenol  or ibuprofen .  You may try over-the-counter topical Voltaren gel as well.  I would like you to follow-up with your neurologist as soon as possible.  Your wrist x-ray did not show a fracture, but there were some abnormalities to your bone that may be a metastatic process.  Please follow-up with your primary doctor for further workup.  Additionally, you do have a elevation in your bilirubin, but otherwise normal liver enzymes.  Again this should be further evaluated on a nonemergent basis with your primary doctors.  Return for fevers, chills, severe headache, facial droop, unilateral weakness, chest pain, shortness of breath, abdominal pain, uncontrolled nausea vomiting, lethargy, seizures or any new or worsening symptoms that are concerning to you.

## 2024-07-28 NOTE — ED Triage Notes (Signed)
 Pt bib ems from home with c/o fall with left knee pain and skin tear to right palm, 3 falls yesterday.

## 2024-07-29 NOTE — ED Provider Notes (Signed)
 Emergency Medicine Observation Re-evaluation Note  John Graham is a 78 y.o. male, seen on rounds today.  Pt initially presented to the ED for complaints of Fall Currently, the patient is resting in the room.  Physical Exam  BP (!) 116/54 (BP Location: Right Arm)   Pulse 74   Temp 98.2 F (36.8 C) (Oral)   Resp 16   Ht 5' 11 (1.803 m)   Wt 94.3 kg   SpO2 98%   BMI 29.00 kg/m  Physical Exam General: resting comfortably, NAD Lungs: normal WOB Psych: currently calm and resting  ED Course / MDM  EKG:   I have reviewed the labs performed to date as well as medications administered while in observation.  Recent changes in the last 24 hours include accepted to SNF.  Plan  Current plan is for transport this morning to SNF.  Patient stable.    John Roxie HERO, DO 07/29/24 0830

## 2024-07-29 NOTE — ED Notes (Signed)
 Checked patient and patient is dry with no incontinence.

## 2024-07-29 NOTE — ED Notes (Signed)
 [facility] called, 30 min eta

## 2024-07-29 NOTE — Telephone Encounter (Signed)
 I spoke with John Graham and informed her of the message below. John Graham reported the patient has an upcoming appt with Neurology and a referral was placed by ED doctors for [name] skilled nursing facility and patient will be receiving therapy there for 1-3 weeks. John Graham reported once patient is discharged from [name] she will schedule a HFU for him.

## 2024-07-29 NOTE — Progress Notes (Signed)
 Pt ready to dc to [facility]. Call report [phone] room [phone]. Pt will transport via [facility].

## 2024-07-30 NOTE — Progress Notes (Unsigned)
 NEUROLOGY FOLLOW UP OFFICE NOTE  John Graham [id number]  Assessment/Plan:   Parkinson's disease - at this time he meets criteria  Initiate carbidopa -levodopa  25/100mg :  *** ***   Subjective:  John Graham is a 78 year old male with HTN, HLD, and PTSD who follows up for Parkinsonian tremor and recent falls.  ED note reviewed.  He is accompanied by his daughter who also supplements history.  CT head personally reviewed.  UPDATE: Last seen in May 2024.  ***.  He had started having more difficulty ambulating ***.  He was seen in the ED on 12/15.  CT head demonstrated mild to moderate chronic small vessel ischemic changes, generalized parenchymal volume loss and remote right basal ganglia lacunar infarct but no acute intracranial abnormality.  HISTORY: He has had a pill rolling tremor since at least 2019, involving both hands.  Notices it spontaneously at rest and while writing.  Not aggravated by stress or fatigue.  Does not drink alcohol or caffeinated beverages.  Reports trouble buttoning clothes and sometimes getting out of a chair.  Notes loss of smell and taste.  Some mild short-term memory difficulty but independent.  No double vision, lightheadedness, incontinence, or difficulty swallowing.  No history consistent with REM behavioral sleep disorder.  No family history of tremor.  Evaluated by Dr. Asberry Tat, movement disorder specialist, in June 2023.  At that time he demonstrated Parkinsonian tremor but no other clear signs of PD that would satisfy criteria for formal diagnosis of Parkinson's disease.  Recommended monitoring.    PAST MEDICAL HISTORY: Past Medical History:  Diagnosis Date   Anxiety    PTSD   HOH (hard of hearing)    no hearing aids   Hypercholesteremia    Hyperlipidemia    Hypertension     MEDICATIONS: Current Outpatient Medications on File Prior to Visit  Medication Sig Dispense Refill   apixaban  (ELIQUIS ) 5 MG TABS tablet Take 1 tablet (5 mg total)  by mouth 2 (two) times daily. Start taking after completion of starter pack. 60 tablet 4   ezetimibe  (ZETIA ) 10 MG tablet TAKE 1 TABLET BY MOUTH EVERY DAY 90 tablet 0   metoprolol  tartrate (LOPRESSOR ) 25 MG tablet TAKE 1 TABLET BY MOUTH TWICE A DAY 60 tablet 0   Multiple Minerals-Vitamins (CAL MAG ZINC +D3 PO) Take 1 tablet by mouth every evening.     Multiple Vitamin (MULTIVITAMIN WITH MINERALS) TABS tablet Take 1 tablet by mouth in the morning. Centrum     sertraline  (ZOLOFT ) 50 MG tablet TAKE 1 TABLET BY MOUTH EVERY DAY 30 tablet 0   simvastatin  (ZOCOR ) 40 MG tablet TAKE 1 TABLET BY MOUTH EVERYDAY AT BEDTIME 30 tablet 0   Current Facility-Administered Medications on File Prior to Visit  Medication Dose Route Frequency Provider Last Rate Last Admin   0.9 %  sodium chloride  infusion  500 mL Intravenous Once Abran Norleen SAILOR, MD        ALLERGIES: No Known Allergies  FAMILY HISTORY: Family History  Problem Relation Age of Onset   Pancreatic cancer Mother    Cancer Father        Lymph Nodes   Colon cancer Neg Hx       Objective:  *** General: No acute distress.  Patient appears well-groomed.   Head:  Normocephalic/atraumatic Eyes:  Fundi examined but not visualized Neck: supple, no paraspinal tenderness, full range of motion Heart:  Regular rate and rhythm Lungs:  Clear to auscultation bilaterally Back: No  paraspinal tenderness Neurological Exam: alert and oriented to person, place, and time.  Speech fluent and not dysarthric, language intact.  Slight right sided lower facial weakness.  Decreased hearing bilaterally.  Otherwise, CN II-XII intact. Bulk and tone normal, no rigidity or bradykinesia; muscle strength 5/5 throughout.  Right hand resting tremor.  No postural or kinetic tremor noted.  Sensation to light touch intact.  Deep tendon reflexes 2+ throughout  Finger to nose testing intact.  Gait with upright posture and normal arm swing that is wide-based but not shuffling.  Romberg  with mild sway.   Juliene Dunnings, DO  CC: Wolm Scarlet, MD

## 2024-07-31 ENCOUNTER — Ambulatory Visit: Admitting: Neurology

## 2024-07-31 ENCOUNTER — Encounter: Payer: Self-pay | Admitting: Neurology

## 2024-07-31 ENCOUNTER — Telehealth: Payer: Self-pay | Admitting: Neurology

## 2024-07-31 VITALS — BP 128/68 | HR 59

## 2024-07-31 DIAGNOSIS — G20C Parkinsonism, unspecified: Secondary | ICD-10-CM | POA: Diagnosis not present

## 2024-07-31 MED ORDER — CARBIDOPA-LEVODOPA 25-100 MG PO TABS: ORAL_TABLET | ORAL | 0 refills | Status: DC

## 2024-07-31 NOTE — Telephone Encounter (Signed)
 Pt's daughter would like Dr.jaffe to see and be aware of CT scan 06/2022 prior to visit

## 2024-07-31 NOTE — Patient Instructions (Addendum)
 1. Start carbidopa  (Sinemet ) 25/100mg  tablets.  Week 1: Take 0.5 tablet in morning and 0.5 tablet in evening/bedtime.  Week 2: Take 0.5 tablet in morning, 0.5 tablet in afternoon, and 0.5 tablet in evening/bedtime.  Week 3 & thereafter: Take 1 tablet three times daily.   Take the medication at the same time everyday. The medication does not get absorbed into your body as well, if you take it with protein-containing foods (meat, dairy, beans). Try taking the medication about one hour before meals. If you experience nausea by taking it on an empty stomach, you may take it with carbohydrate-containing food,such as bread or crackers. Side effects to look out for, include dizziness, nausea, vivid dreams and hallucinations. If you experience any of these symptoms, please call us .  2. Physical therapy 3. Follow up 4  months.

## 2024-08-01 NOTE — Telephone Encounter (Signed)
 Lmom asking pt to cb to sch an appt

## 2024-08-03 ENCOUNTER — Inpatient Hospital Stay (HOSPITAL_COMMUNITY): Admitting: Anesthesiology

## 2024-08-03 ENCOUNTER — Inpatient Hospital Stay (HOSPITAL_COMMUNITY)
Admission: EM | Admit: 2024-08-03 | Discharge: 2024-08-07 | DRG: 482 | Disposition: A | Discharge status: Skilled Nursing Facility | Attending: Internal Medicine | Admitting: Internal Medicine

## 2024-08-03 ENCOUNTER — Inpatient Hospital Stay (HOSPITAL_COMMUNITY)

## 2024-08-03 ENCOUNTER — Emergency Department (HOSPITAL_COMMUNITY)

## 2024-08-03 ENCOUNTER — Other Ambulatory Visit: Payer: Self-pay

## 2024-08-03 ENCOUNTER — Encounter (HOSPITAL_COMMUNITY): Payer: Self-pay

## 2024-08-03 ENCOUNTER — Encounter (HOSPITAL_COMMUNITY): Admission: EM | Disposition: A | Discharge status: Skilled Nursing Facility | Payer: Self-pay | Source: Home / Self Care

## 2024-08-03 DIAGNOSIS — Y92009 Unspecified place in unspecified non-institutional (private) residence as the place of occurrence of the external cause: Secondary | ICD-10-CM | POA: Diagnosis not present

## 2024-08-03 DIAGNOSIS — Y92013 Bedroom of single-family (private) house as the place of occurrence of the external cause: Secondary | ICD-10-CM | POA: Diagnosis not present

## 2024-08-03 DIAGNOSIS — F32A Depression, unspecified: Secondary | ICD-10-CM | POA: Diagnosis present

## 2024-08-03 DIAGNOSIS — Z800 Family history of malignant neoplasm of digestive organs: Secondary | ICD-10-CM | POA: Diagnosis not present

## 2024-08-03 DIAGNOSIS — S72002D Fracture of unspecified part of neck of left femur, subsequent encounter for closed fracture with routine healing: Secondary | ICD-10-CM

## 2024-08-03 DIAGNOSIS — S7222XA Displaced subtrochanteric fracture of left femur, initial encounter for closed fracture: Secondary | ICD-10-CM | POA: Diagnosis present

## 2024-08-03 DIAGNOSIS — Z7901 Long term (current) use of anticoagulants: Secondary | ICD-10-CM | POA: Diagnosis not present

## 2024-08-03 DIAGNOSIS — H9190 Unspecified hearing loss, unspecified ear: Secondary | ICD-10-CM | POA: Diagnosis present

## 2024-08-03 DIAGNOSIS — F17210 Nicotine dependence, cigarettes, uncomplicated: Secondary | ICD-10-CM

## 2024-08-03 DIAGNOSIS — Z86718 Personal history of other venous thrombosis and embolism: Secondary | ICD-10-CM | POA: Diagnosis not present

## 2024-08-03 DIAGNOSIS — S79912A Unspecified injury of left hip, initial encounter: Secondary | ICD-10-CM | POA: Diagnosis present

## 2024-08-03 DIAGNOSIS — W06XXXA Fall from bed, initial encounter: Secondary | ICD-10-CM | POA: Diagnosis present

## 2024-08-03 DIAGNOSIS — W19XXXA Unspecified fall, initial encounter: Secondary | ICD-10-CM | POA: Diagnosis not present

## 2024-08-03 DIAGNOSIS — Z85118 Personal history of other malignant neoplasm of bronchus and lung: Secondary | ICD-10-CM

## 2024-08-03 DIAGNOSIS — I10 Essential (primary) hypertension: Secondary | ICD-10-CM | POA: Diagnosis present

## 2024-08-03 DIAGNOSIS — S72002A Fracture of unspecified part of neck of left femur, initial encounter for closed fracture: Secondary | ICD-10-CM | POA: Diagnosis not present

## 2024-08-03 DIAGNOSIS — R9431 Abnormal electrocardiogram [ECG] [EKG]: Secondary | ICD-10-CM | POA: Diagnosis present

## 2024-08-03 DIAGNOSIS — Z79899 Other long term (current) drug therapy: Secondary | ICD-10-CM

## 2024-08-03 DIAGNOSIS — Z9181 History of falling: Secondary | ICD-10-CM | POA: Diagnosis not present

## 2024-08-03 DIAGNOSIS — G20A1 Parkinson's disease without dyskinesia, without mention of fluctuations: Secondary | ICD-10-CM | POA: Diagnosis present

## 2024-08-03 DIAGNOSIS — Z751 Person awaiting admission to adequate facility elsewhere: Secondary | ICD-10-CM

## 2024-08-03 DIAGNOSIS — Z7982 Long term (current) use of aspirin: Secondary | ICD-10-CM

## 2024-08-03 DIAGNOSIS — R296 Repeated falls: Secondary | ICD-10-CM | POA: Diagnosis present

## 2024-08-03 DIAGNOSIS — F4310 Post-traumatic stress disorder, unspecified: Secondary | ICD-10-CM | POA: Diagnosis present

## 2024-08-03 DIAGNOSIS — E7800 Pure hypercholesterolemia, unspecified: Secondary | ICD-10-CM | POA: Diagnosis present

## 2024-08-03 DIAGNOSIS — D649 Anemia, unspecified: Secondary | ICD-10-CM | POA: Diagnosis not present

## 2024-08-03 DIAGNOSIS — E785 Hyperlipidemia, unspecified: Secondary | ICD-10-CM | POA: Diagnosis present

## 2024-08-03 HISTORY — DX: Malignant neoplasm of unspecified part of unspecified bronchus or lung: C34.90

## 2024-08-03 HISTORY — PX: INTRAMEDULLARY (IM) NAIL INTERTROCHANTERIC: SHX5875

## 2024-08-03 LAB — CBC
HCT: 38.6 % — ABNORMAL LOW (ref 39.0–52.0)
Hemoglobin: 12.5 g/dL — ABNORMAL LOW (ref 13.0–17.0)
MCH: 31.2 pg (ref 26.0–34.0)
MCHC: 32.4 g/dL (ref 30.0–36.0)
MCV: 96.3 fL (ref 80.0–100.0)
Platelets: 183 K/uL (ref 150–400)
RBC: 4.01 MIL/uL — ABNORMAL LOW (ref 4.22–5.81)
RDW: 13.3 % (ref 11.5–15.5)
WBC: 7 K/uL (ref 4.0–10.5)
nRBC: 0 % (ref 0.0–0.2)

## 2024-08-03 LAB — ABO/RH: ABO/RH(D): A POS

## 2024-08-03 LAB — BASIC METABOLIC PANEL WITH GFR
Anion gap: 10 (ref 5–15)
BUN: 21 mg/dL (ref 8–23)
CO2: 24 mmol/L (ref 22–32)
Calcium: 8.8 mg/dL — ABNORMAL LOW (ref 8.9–10.3)
Chloride: 106 mmol/L (ref 98–111)
Creatinine, Ser: 0.9 mg/dL (ref 0.61–1.24)
GFR, Estimated: >60 mL/min
Glucose, Bld: 114 mg/dL — ABNORMAL HIGH (ref 70–99)
Potassium: 4.5 mmol/L (ref 3.5–5.1)
Sodium: 140 mmol/L (ref 135–145)

## 2024-08-03 LAB — APTT: aPTT: 24 s (ref 24–36)

## 2024-08-03 LAB — SURGICAL PCR SCREEN
MRSA, PCR: NEGATIVE
Staphylococcus aureus: NEGATIVE

## 2024-08-03 LAB — TYPE AND SCREEN
ABO/RH(D): A POS
Antibody Screen: NEGATIVE

## 2024-08-03 LAB — PROTIME-INR
INR: 1.1 (ref 0.8–1.2)
Prothrombin Time: 14.8 s (ref 11.4–15.2)

## 2024-08-03 SURGERY — FIXATION, FRACTURE, INTERTROCHANTERIC, WITH INTRAMEDULLARY ROD
Anesthesia: General | Site: Hip | Laterality: Left

## 2024-08-03 MED ORDER — MORPHINE SULFATE (PF) 2 MG/ML IV SOLN
0.5000 mg | INTRAVENOUS | Status: DC | PRN
  Administered 2024-08-04 (×2): 0.5 mg via INTRAVENOUS
  Filled 2024-08-03 (×2): qty 1

## 2024-08-03 MED ORDER — OXYCODONE HCL 5 MG PO TABS: 5.0000 mg | ORAL_TABLET | Freq: Once | ORAL | Status: DC | PRN

## 2024-08-03 MED ORDER — METOPROLOL TARTRATE 5 MG/5ML IV SOLN
INTRAVENOUS | Status: DC | PRN
  Administered 2024-08-03 (×2): 1 mg via INTRAVENOUS

## 2024-08-03 MED ORDER — HYDROMORPHONE HCL 1 MG/ML IJ SOLN
INTRAMUSCULAR | Status: DC | PRN
  Administered 2024-08-03 (×2): .25 mg via INTRAVENOUS

## 2024-08-03 MED ORDER — CHLORHEXIDINE GLUCONATE 0.12 % MT SOLN
OROMUCOSAL | Status: AC
  Filled 2024-08-03: qty 15

## 2024-08-03 MED ORDER — EZETIMIBE 10 MG PO TABS
10.0000 mg | ORAL_TABLET | Freq: Every day | ORAL | Status: DC
  Administered 2024-08-04 – 2024-08-07 (×4): 10 mg via ORAL
  Filled 2024-08-03 (×4): qty 1

## 2024-08-03 MED ORDER — ROCURONIUM BROMIDE 10 MG/ML (PF) SYRINGE
PREFILLED_SYRINGE | INTRAVENOUS | Status: AC
  Filled 2024-08-03: qty 10

## 2024-08-03 MED ORDER — PROPOFOL 10 MG/ML IV BOLUS
INTRAVENOUS | Status: AC
  Filled 2024-08-03: qty 20

## 2024-08-03 MED ORDER — HYDROCODONE-ACETAMINOPHEN 5-325 MG PO TABS
1.0000 | ORAL_TABLET | Freq: Four times a day (QID) | ORAL | Status: DC | PRN
  Administered 2024-08-03 – 2024-08-04 (×3): 2 via ORAL
  Administered 2024-08-05: 1 via ORAL
  Administered 2024-08-05 – 2024-08-07 (×4): 2 via ORAL
  Filled 2024-08-03 (×7): qty 2
  Filled 2024-08-03: qty 1

## 2024-08-03 MED ORDER — HYDROMORPHONE HCL 1 MG/ML IJ SOLN
INTRAMUSCULAR | Status: AC
  Filled 2024-08-03: qty 0.5

## 2024-08-03 MED ORDER — PROPOFOL 10 MG/ML IV BOLUS
INTRAVENOUS | Status: DC | PRN
  Administered 2024-08-03: 50 mg via INTRAVENOUS
  Administered 2024-08-03: 100 mg via INTRAVENOUS

## 2024-08-03 MED ORDER — FENTANYL CITRATE (PF) 250 MCG/5ML IJ SOLN
INTRAMUSCULAR | Status: DC | PRN
  Administered 2024-08-03 (×2): 100 ug via INTRAVENOUS
  Administered 2024-08-03: 50 ug via INTRAVENOUS

## 2024-08-03 MED ORDER — CEFAZOLIN SODIUM-DEXTROSE 2-4 GM/100ML-% IV SOLN
2.0000 g | INTRAVENOUS | Status: AC
  Administered 2024-08-03: 2 g via INTRAVENOUS

## 2024-08-03 MED ORDER — PHENYLEPHRINE 80 MCG/ML (10ML) SYRINGE FOR IV PUSH (FOR BLOOD PRESSURE SUPPORT)
PREFILLED_SYRINGE | INTRAVENOUS | Status: DC | PRN
  Administered 2024-08-03: 160 ug via INTRAVENOUS
  Administered 2024-08-03: 80 ug via INTRAVENOUS
  Administered 2024-08-03 (×2): 160 ug via INTRAVENOUS

## 2024-08-03 MED ORDER — CEFAZOLIN SODIUM-DEXTROSE 2-4 GM/100ML-% IV SOLN
INTRAVENOUS | Status: AC
  Filled 2024-08-03: qty 100

## 2024-08-03 MED ORDER — 0.9 % SODIUM CHLORIDE (POUR BTL) OPTIME
TOPICAL | Status: DC | PRN
  Administered 2024-08-03: 1000 mL

## 2024-08-03 MED ORDER — ENOXAPARIN SODIUM 40 MG/0.4ML IJ SOSY: 40.0000 mg | PREFILLED_SYRINGE | INTRAMUSCULAR | Status: DC

## 2024-08-03 MED ORDER — LACTATED RINGERS IV SOLN: INTRAVENOUS | Status: DC

## 2024-08-03 MED ORDER — CHLORHEXIDINE GLUCONATE 0.12 % MT SOLN
15.0000 mL | Freq: Once | OROMUCOSAL | Status: AC
  Administered 2024-08-03: 15 mL via OROMUCOSAL

## 2024-08-03 MED ORDER — SIMVASTATIN 20 MG PO TABS
40.0000 mg | ORAL_TABLET | Freq: Every day | ORAL | Status: DC
  Administered 2024-08-04 – 2024-08-06 (×3): 40 mg via ORAL
  Filled 2024-08-03 (×3): qty 2

## 2024-08-03 MED ORDER — LIDOCAINE 2% (20 MG/ML) 5 ML SYRINGE
INTRAMUSCULAR | Status: DC | PRN
  Administered 2024-08-03: 10 mg via INTRAVENOUS

## 2024-08-03 MED ORDER — DEXAMETHASONE SOD PHOSPHATE PF 10 MG/ML IJ SOLN
INTRAMUSCULAR | Status: DC | PRN
  Administered 2024-08-03: 5 mg via INTRAVENOUS

## 2024-08-03 MED ORDER — POVIDONE-IODINE 10 % EX SWAB
2.0000 | Freq: Once | CUTANEOUS | Status: AC
  Administered 2024-08-03: 2 via TOPICAL

## 2024-08-03 MED ORDER — SUGAMMADEX SODIUM 200 MG/2ML IV SOLN
INTRAVENOUS | Status: AC
  Filled 2024-08-03: qty 2

## 2024-08-03 MED ORDER — ORAL CARE MOUTH RINSE: 15.0000 mL | OROMUCOSAL | Status: DC | PRN

## 2024-08-03 MED ORDER — ONDANSETRON HCL 4 MG/2ML IJ SOLN
INTRAMUSCULAR | Status: DC | PRN
  Administered 2024-08-03: 4 mg via INTRAVENOUS

## 2024-08-03 MED ORDER — ORAL CARE MOUTH RINSE: 15.0000 mL | Freq: Once | OROMUCOSAL | Status: AC

## 2024-08-03 MED ORDER — ESMOLOL HCL 100 MG/10ML IV SOLN
INTRAVENOUS | Status: DC | PRN
  Administered 2024-08-03 (×2): 50 mg via INTRAVENOUS

## 2024-08-03 MED ORDER — SERTRALINE HCL 50 MG PO TABS
50.0000 mg | ORAL_TABLET | Freq: Every day | ORAL | Status: DC
  Administered 2024-08-04 – 2024-08-07 (×4): 50 mg via ORAL
  Filled 2024-08-03 (×4): qty 1

## 2024-08-03 MED ORDER — PHENYLEPHRINE 80 MCG/ML (10ML) SYRINGE FOR IV PUSH (FOR BLOOD PRESSURE SUPPORT)
PREFILLED_SYRINGE | INTRAVENOUS | Status: AC
  Filled 2024-08-03: qty 10

## 2024-08-03 MED ORDER — TRANEXAMIC ACID-NACL 1000-0.7 MG/100ML-% IV SOLN
1000.0000 mg | INTRAVENOUS | Status: AC
  Administered 2024-08-03: 1000 mg via INTRAVENOUS

## 2024-08-03 MED ORDER — FENTANYL CITRATE (PF) 100 MCG/2ML IJ SOLN: 25.0000 ug | INTRAMUSCULAR | Status: DC | PRN

## 2024-08-03 MED ORDER — ACETAMINOPHEN 10 MG/ML IV SOLN: 1000.0000 mg | Freq: Once | INTRAVENOUS | Status: DC | PRN

## 2024-08-03 MED ORDER — MORPHINE SULFATE (PF) 2 MG/ML IV SOLN: 2.0000 mg | Freq: Once | INTRAVENOUS | Status: DC

## 2024-08-03 MED ORDER — ESMOLOL HCL 100 MG/10ML IV SOLN
INTRAVENOUS | Status: AC
  Filled 2024-08-03: qty 10

## 2024-08-03 MED ORDER — LIDOCAINE 2% (20 MG/ML) 5 ML SYRINGE
INTRAMUSCULAR | Status: AC
  Filled 2024-08-03: qty 5

## 2024-08-03 MED ORDER — CHLORHEXIDINE GLUCONATE 4 % EX SOLN: 60.0000 mL | Freq: Once | CUTANEOUS | Status: DC

## 2024-08-03 MED ORDER — FENTANYL CITRATE (PF) 50 MCG/ML IJ SOSY
50.0000 ug | PREFILLED_SYRINGE | Freq: Once | INTRAMUSCULAR | Status: AC
  Administered 2024-08-03: 50 ug via INTRAVENOUS
  Filled 2024-08-03: qty 1

## 2024-08-03 MED ORDER — METOPROLOL TARTRATE 5 MG/5ML IV SOLN
INTRAVENOUS | Status: AC
  Filled 2024-08-03: qty 5

## 2024-08-03 MED ORDER — CEFAZOLIN SODIUM-DEXTROSE 2-4 GM/100ML-% IV SOLN
2.0000 g | Freq: Three times a day (TID) | INTRAVENOUS | Status: AC
  Administered 2024-08-03 – 2024-08-04 (×2): 2 g via INTRAVENOUS
  Filled 2024-08-03 (×2): qty 100

## 2024-08-03 MED ORDER — SUGAMMADEX SODIUM 200 MG/2ML IV SOLN
INTRAVENOUS | Status: DC | PRN
  Administered 2024-08-03: 200 mg via INTRAVENOUS

## 2024-08-03 MED ORDER — ONDANSETRON HCL 4 MG/2ML IJ SOLN
INTRAMUSCULAR | Status: AC
  Filled 2024-08-03: qty 2

## 2024-08-03 MED ORDER — METOPROLOL TARTRATE 25 MG PO TABS
25.0000 mg | ORAL_TABLET | Freq: Two times a day (BID) | ORAL | Status: DC
  Administered 2024-08-03 – 2024-08-07 (×8): 25 mg via ORAL
  Filled 2024-08-03 (×8): qty 1

## 2024-08-03 MED ORDER — APIXABAN 2.5 MG PO TABS: 2.5000 mg | ORAL_TABLET | Freq: Two times a day (BID) | ORAL | Status: DC

## 2024-08-03 MED ORDER — OXYCODONE HCL 5 MG/5ML PO SOLN: 5.0000 mg | Freq: Once | ORAL | Status: DC | PRN

## 2024-08-03 MED ORDER — ONDANSETRON HCL 4 MG/2ML IJ SOLN: 4.0000 mg | Freq: Once | INTRAMUSCULAR | Status: DC

## 2024-08-03 MED ORDER — MIDAZOLAM HCL 2 MG/2ML IJ SOLN
INTRAMUSCULAR | Status: AC
  Filled 2024-08-03: qty 2

## 2024-08-03 MED ORDER — SENNOSIDES-DOCUSATE SODIUM 8.6-50 MG PO TABS
1.0000 | ORAL_TABLET | Freq: Two times a day (BID) | ORAL | Status: DC
  Administered 2024-08-03 – 2024-08-07 (×8): 1 via ORAL
  Filled 2024-08-03 (×8): qty 1

## 2024-08-03 MED ORDER — TRANEXAMIC ACID-NACL 1000-0.7 MG/100ML-% IV SOLN
INTRAVENOUS | Status: AC
  Filled 2024-08-03: qty 100

## 2024-08-03 MED ORDER — FENTANYL CITRATE (PF) 250 MCG/5ML IJ SOLN
INTRAMUSCULAR | Status: AC
  Filled 2024-08-03: qty 5

## 2024-08-03 MED ORDER — STERILE WATER FOR IRRIGATION IR SOLN
Status: DC | PRN
  Administered 2024-08-03: 1000 mL

## 2024-08-03 MED ORDER — ROCURONIUM BROMIDE 10 MG/ML (PF) SYRINGE
PREFILLED_SYRINGE | INTRAVENOUS | Status: DC | PRN
  Administered 2024-08-03: 50 mg via INTRAVENOUS

## 2024-08-03 SURGICAL SUPPLY — 37 items
BAG COUNTER SPONGE SURGICOUNT (BAG) ×1 IMPLANT
BIT DRILL INTERTAN LAG SCREW (BIT) IMPLANT
BIT DRILL SHORT 4.0 (BIT) IMPLANT
CHLORAPREP W/TINT 26 (MISCELLANEOUS) ×1 IMPLANT
COVER PERINEAL POST (MISCELLANEOUS) ×1 IMPLANT
COVER SURGICAL LIGHT HANDLE (MISCELLANEOUS) ×1 IMPLANT
DERMABOND ADVANCED .7 DNX12 (GAUZE/BANDAGES/DRESSINGS) IMPLANT
DERMABOND ADVANCED .7 DNX6 (GAUZE/BANDAGES/DRESSINGS) ×1 IMPLANT
DRAPE C-ARM 42X72 X-RAY (DRAPES) ×1 IMPLANT
DRAPE C-ARMOR (DRAPES) ×1 IMPLANT
DRAPE STERI IOBAN 125X83 (DRAPES) ×1 IMPLANT
DRAPE U-SHAPE 47X51 STRL (DRAPES) ×2 IMPLANT
DRESSING MEPILEX FLEX 4X4 (GAUZE/BANDAGES/DRESSINGS) IMPLANT
DRSG TEGADERM 4X4.75 (GAUZE/BANDAGES/DRESSINGS) ×3 IMPLANT
ELECT REM PT RETURN 15FT ADLT (MISCELLANEOUS) IMPLANT
GAUZE SPONGE 4X4 16PLY NS LF (WOUND CARE) ×1 IMPLANT
GLOVE BIO SURGEON STRL SZ 6.5 (GLOVE) ×1 IMPLANT
GLOVE BIOGEL PI IND STRL 6.5 (GLOVE) ×1 IMPLANT
GLOVE BIOGEL PI IND STRL 8 (GLOVE) ×1 IMPLANT
GLOVE PI ORTHO PRO STRL SZ8 (GLOVE) ×2 IMPLANT
GOWN STRL REUS W/ TWL XL LVL3 (GOWN DISPOSABLE) ×3 IMPLANT
KIT BASIN OR (CUSTOM PROCEDURE TRAY) ×1 IMPLANT
KIT TURNOVER KIT B (KITS) IMPLANT
MANIFOLD NEPTUNE II (INSTRUMENTS) ×1 IMPLANT
NAIL LOCK CANN 10X420 130D LT (Nail) IMPLANT
PACK GENERAL/GYN (CUSTOM PROCEDURE TRAY) ×1 IMPLANT
PAD ARMBOARD POSITIONER FOAM (MISCELLANEOUS) ×1 IMPLANT
PIN GUIDE 3.2X343MM (PIN) IMPLANT
ROD GUIDE 3.0 (MISCELLANEOUS) IMPLANT
SCREW LAG COMPR KIT 105/100 (Screw) IMPLANT
SCREW TRIGEN LOW PROF 5.0X40 (Screw) IMPLANT
SCREW TRIGEN LOW PROF 5.0X45 (Screw) IMPLANT
SOLN 0.9% NACL POUR BTL 1000ML (IV SOLUTION) ×1 IMPLANT
SUT MNCRL AB 3-0 PS2 18 (SUTURE) ×1 IMPLANT
SUT VIC AB 0 CT1 27XBRD ANBCTR (SUTURE) ×1 IMPLANT
SUT VIC AB 2-0 CT2 27 (SUTURE) ×1 IMPLANT
TOWEL OR 17X24 6PK STRL BLUE (TOWEL DISPOSABLE) ×1 IMPLANT

## 2024-08-03 NOTE — ED Triage Notes (Signed)
 Pt bib [facility] coming from [facility] after patient slid off edge of bed, falling on his left side. Pt has deformity to left hip. No LOC/head injury. Pt was on eliquis  but hasn't taken it in four days. GCS 15.  EMS VS: 118/78 71 HR 18  98% RA 128 cbg

## 2024-08-03 NOTE — Transfer of Care (Signed)
 Immediate Anesthesia Transfer of Care Note  Patient: John Graham  Procedure(s) Performed: FIXATION, FRACTURE, INTERTROCHANTERIC, WITH INTRAMEDULLARY ROD (Left: Hip)  Patient Location: PACU  Anesthesia Type:General  Level of Consciousness: awake and alert   Airway & Oxygen Therapy: Patient Spontanous Breathing and Patient connected to face mask oxygen  Post-op Assessment: Report given to RN and Post -op Vital signs reviewed and stable  Post vital signs: Reviewed and stable  Last Vitals:  Vitals Value Taken Time  BP 139/69 08/03/24 16:32  Temp    Pulse 96 08/03/24 16:37  Resp 15 08/03/24 16:37  SpO2 100 % 08/03/24 16:37  Vitals shown include unfiled device data.  Last Pain:  Vitals:   08/03/24 1416  TempSrc: Oral  PainSc: 0-No pain         Complications: No notable events documented.

## 2024-08-03 NOTE — ED Provider Notes (Signed)
 " [place] EMERGENCY DEPARTMENT AT [facility] Provider Note   CSN: [id number] Arrival date & time: 08/03/24  9255     Patient presents with: Hip Pain   John Graham is a 78 y.o. male.   78 year old male with past medical history of Parkinson's as well as hypertension and hyperlipidemia on Eliquis  presenting to the emergency department today after a fall at home.  The patient slid from his bed to the floor and fell on his left hip.  He did not hit his head or lose consciousness.  He is complaining of pain in his left hip since then.  Denies any headache or neck pain.  He was unable to get up and was brought to the ER for further evaluation.  This was a mechanical fall.  He denies any other injuries.  He denies any weakness, numbness, or tingling in his extremities.        Prior to Admission medications  Medication Sig Start Date End Date Taking? Authorizing Provider  apixaban  (ELIQUIS ) 5 MG TABS tablet Take 1 tablet (5 mg total) by mouth 2 (two) times daily. Start taking after completion of starter pack. 08/03/23   Barbarann Dixon B, RPH-CPP  carbidopa -levodopa  (SINEMET  IR) 25-100 MG tablet Take 0.5 tablet twice daily for one week, then 0.5 tablet three times daily for one week, then increase to 1 tablet three times daily.  Take each dose at same time daily, at least one hour before meals. 07/31/24   Skeet, Adam R, DO  ezetimibe  (ZETIA ) 10 MG tablet TAKE 1 TABLET BY MOUTH EVERY DAY 09/28/23   Burchette, Wolm ORN, MD  metoprolol  tartrate (LOPRESSOR ) 25 MG tablet TAKE 1 TABLET BY MOUTH TWICE A DAY 07/23/24   Burchette, Wolm ORN, MD  Multiple Minerals-Vitamins (CAL MAG ZINC +D3 PO) Take 1 tablet by mouth every evening.    [provider]  Multiple Vitamin (MULTIVITAMIN WITH MINERALS) TABS tablet Take 1 tablet by mouth in the morning. Centrum    [provider]  sertraline  (ZOLOFT ) 50 MG tablet TAKE 1 TABLET BY MOUTH EVERY DAY 07/23/24   Burchette, Wolm ORN, MD   simvastatin  (ZOCOR ) 40 MG tablet TAKE 1 TABLET BY MOUTH EVERYDAY AT BEDTIME 04/28/24   Burchette, Wolm ORN, MD    Allergies: Patient has no known allergies.    Review of Systems  Musculoskeletal:  Positive for arthralgias.  All other systems reviewed and are negative.   Updated Vital Signs BP 139/72   Pulse 65   Temp (!) 97.5 F (36.4 C) (Oral)   Resp (!) 21   SpO2 100%   Physical Exam Vitals and nursing note reviewed.   Gen: NAD Eyes: PERRL, EOMI HEENT: no oropharyngeal swelling Neck: trachea midline, no cervical spine tenderness, no stepoffs or deformities Resp: clear to auscultation bilaterally Card: RRR, no murmurs, rubs, or gallops Abd: nontender, nondistended, no seatbelt sign Extremities: no calf tenderness, no edema MSK: no thoracic spinal tenderness, no lumbar spinal tenderness, no step-offs or deformities, obvious deformity noted to the left hip and patient is tender over the proximal femur on the left, pelvis is stable, the left leg is shortened and externally rotated Vascular: 2+ radial pulses bilaterally, 2+ DP pulses bilaterally Neuro: Alert and oriented x 3, equal strength sensation throughout bilateral upper and lower extremities Skin: no rashes    (all labs ordered are listed, but only abnormal results are displayed) Labs Reviewed  CBC - Abnormal; Notable for the following components:  Result Value   RBC 4.01 (*)    Hemoglobin 12.5 (*)    HCT 38.6 (*)    All other components within normal limits  BASIC METABOLIC PANEL WITH GFR - Abnormal; Notable for the following components:   Glucose, Bld 114 (*)    Calcium 8.8 (*)    All other components within normal limits    EKG: EKG Interpretation Date/Time:  Sunday August 03 2024 08:02:33 EST Ventricular Rate:  74 PR Interval:  138 QRS Duration:  90 QT Interval:  463 QTC Calculation: 514 R Axis:   14  Text Interpretation: Sinus rhythm Atrial premature complexes Borderline low voltage,  extremity leads Prolonged QT interval Confirmed by Ula Barter [phone]) on 08/03/2024 8:09:06 AM  Radiology: [facility] Hip Unilat W or Wo Pelvis 2-3 Views Left Result Date: 08/03/2024 EXAM: 2 OR MORE VIEW(S) XRAY OF THE RIGHT HIP 08/03/2024 09:18:00 AM COMPARISON: None available. CLINICAL HISTORY: Fall FINDINGS: BONES AND JOINTS: Comminuted displaced proximal femoral shaft fracture with apex lateral angulation. Cm of shortening with 3.5 cm of medial displacement of the distal femoral shaft. Moderate right hip osteoarthritis. SOFT TISSUES: Diffuse aortoiliac and peripheral vascular atherosclerosis. LUMBAR SPINE: Multilevel degenerative changes of the lumbar spine. IMPRESSION: 1. Comminuted displaced proximal femoral shaft fracture with apex lateral angulation, 3.5 cm of medial displacement of the distal femoral shaft, and 5 cm of foreshortening. Electronically signed by: Rogelia Myers MD 08/03/2024 09:30 AM EST RP Workstation: [id number]     Procedures   Medications Ordered in the ED  fentaNYL  (SUBLIMAZE ) injection 50 mcg (50 mcg Intravenous Given 08/03/24 0823)                                    Medical Decision Making 78 year old male with past medical history of hypertension, hyperlipidemia, and Parkinson disease who is on Eliquis  presenting to the emergency department today with left hip deformity after a fall at home.  Will obtain basic labs here as well as a x-ray of his hip.  Denies any head trauma and this is relatively low mechanism of injury it was time does not appear to have any other injuries on arrival.  Will give the pain chip fentanyl  for pain.  He will likely require admission.  The patient's x-ray does show comminuted left hip fracture.  Discussed the patient's case with Dr. Francies.  He will see the patient in consultation.  Calls placed the hospitalist service for admission.  The patient is a poor historian and cannot tell me exactly when he took his last dose of Eliquis  but  looking back through his records he was certainly discharged from his rehab facility a few days ago and was discharged on Eliquis .  Amount and/or Complexity of Data Reviewed Labs: ordered. Radiology: ordered.  Risk Prescription drug management. Decision regarding hospitalization.        Final diagnoses:  Closed fracture of left hip, initial encounter [facility])    ED Discharge Orders     None          Ula Barter SAUNDERS, MD 08/03/24 [phone]  "

## 2024-08-03 NOTE — Consult Note (Signed)
 "   ORTHOPAEDIC CONSULTATION  REQUESTING PHYSICIAN: Claudene Maximino LABOR, MD  PCP:  Micheal Wolm ORN, MD  Chief Complaint: left hip pain, fall.    HPI: John Graham is a 78 y.o. male PMH DVT, parkinson's, frequent falls who reports to [facility] ED after a fall on his left side. Recent admission 07/28/24 for fall, and discharged to SNF. Patient is a poor historian. He reports he slid out of bed onto the ground on his left side. He denies hitting his head or LOC. He denies any tingling or numbness in LE bilaterally. Question if he has been taking Eliquis  for DVT, per paperwork at bedside, Eliquis  was discontinued and he has been on aspirin 81mg  daily. Currently NPO.     Past Medical History:  Diagnosis Date   Anxiety    PTSD   DVT (deep venous thrombosis) (HCC) 07/2023   HOH (hard of hearing)    no hearing aids   Hypercholesteremia    Hyperlipidemia    Hypertension    Non-small cell lung cancer [facility])    Past Surgical History:  Procedure Laterality Date   BRONCHIAL BIOPSY  02/09/2022   Procedure: BRONCHIAL BIOPSIES;  Surgeon: Brenna Adine CROME, DO;  Location: [facility] ENDOSCOPY;  Service: Pulmonary;;   BRONCHIAL BRUSHINGS  02/09/2022   Procedure: BRONCHIAL BRUSHINGS;  Surgeon: Brenna Adine CROME, DO;  Location: [facility] ENDOSCOPY;  Service: Pulmonary;;   BRONCHIAL NEEDLE ASPIRATION BIOPSY  02/09/2022   Procedure: BRONCHIAL NEEDLE ASPIRATION BIOPSIES;  Surgeon: Brenna Adine CROME, DO;  Location: [facility] ENDOSCOPY;  Service: Pulmonary;;   COLONOSCOPY     DENTAL SURGERY     8 dental implants   POLYPECTOMY     VIDEO BRONCHOSCOPY WITH ENDOBRONCHIAL ULTRASOUND Bilateral 02/09/2022   Procedure: VIDEO BRONCHOSCOPY WITH ENDOBRONCHIAL ULTRASOUND;  Surgeon: Brenna Adine CROME, DO;  Location: [facility] ENDOSCOPY;  Service: Pulmonary;  Laterality: Bilateral;   VIDEO BRONCHOSCOPY WITH RADIAL ENDOBRONCHIAL ULTRASOUND  02/09/2022   Procedure: VIDEO BRONCHOSCOPY WITH RADIAL ENDOBRONCHIAL ULTRASOUND;  Surgeon: Brenna Adine CROME, DO;   Location: [facility] ENDOSCOPY;  Service: Pulmonary;;   Social History   Socioeconomic History   Marital status: Married    Spouse name: Not on file   Number of children: Not on file   Years of education: Not on file   Highest education level: Not on file  Occupational History   Not on file  Tobacco Use   Smoking status: Every Day    Current packs/day: 1.00    Average packs/day: 1 pack/day for 53.0 years (53.0 ttl pk-yrs)    Types: Cigarettes   Smokeless tobacco: Never   Tobacco comments:    20 cigarettes smoked daily 02/17/2022 hfb  Vaping Use   Vaping status: Never Used  Substance and Sexual Activity   Alcohol use: No   Drug use: No   Sexual activity: Not on file  Other Topics Concern   Not on file  Social History Narrative   ** Merged History Encounter **    Left handed    Control and instrumentation engineer    Social Drivers of Health   Tobacco Use: High Risk (08/03/2024)   Patient History    Smoking Tobacco Use: Every Day    Smokeless Tobacco Use: Never    Passive Exposure: Not on file  Financial Resource Strain: Low Risk (03/02/2023)   Overall Financial Resource Strain (CARDIA)    Difficulty of Paying Living Expenses: Not hard at all  Food Insecurity: No Food Insecurity (03/02/2023)   Hunger Vital Sign  Worried About Programme Researcher, Broadcasting/film/video in the Last Year: Never true    Ran Out of Food in the Last Year: Never true  Transportation Needs: No Transportation Needs (03/02/2023)   PRAPARE - Administrator, Civil Service (Medical): No    Lack of Transportation (Non-Medical): No  Physical Activity: Inactive (03/02/2023)   Exercise Vital Sign    Days of Exercise per Week: 0 days    Minutes of Exercise per Session: 0 min  Stress: No Stress Concern Present (03/02/2023)   [employer] of Occupational Health - Occupational Stress Questionnaire    Feeling of Stress : Not at all  Social Connections: Socially Integrated (03/02/2023)   Social Connection and Isolation Panel     Frequency of Communication with Friends and Family: More than three times a week    Frequency of Social Gatherings with Friends and Family: More than three times a week    Attends Religious Services: More than 4 times per year    Active Member of Clubs or Organizations: Yes    Attends Banker Meetings: More than 4 times per year    Marital Status: Married  Depression (PHQ2-9): Low Risk (08/02/2023)   Depression (PHQ2-9)    PHQ-2 Score: 0  Alcohol Screen: Low Risk (03/02/2023)   Alcohol Screen    Last Alcohol Screening Score (AUDIT): 0  Housing: Low Risk (03/02/2023)   Housing    Last Housing Risk Score: 0  Utilities: Not At Risk (03/02/2023)   AHC Utilities    Threatened with loss of utilities: No  Health Literacy: Adequate Health Literacy (03/02/2023)   B1300 Health Literacy    Frequency of need for help with medical instructions: Never   Family History  Problem Relation Age of Onset   Pancreatic cancer Mother    Cancer Father        Lymph Nodes   Colon cancer Neg Hx    Allergies[1] Prior to Admission medications  Medication Sig Start Date End Date Taking? Authorizing Provider  apixaban  (ELIQUIS ) 5 MG TABS tablet Take 1 tablet (5 mg total) by mouth 2 (two) times daily. Start taking after completion of starter pack. 08/03/23  Yes Barbarann Dixon B, RPH-CPP  aspirin EC 81 MG tablet Take 81 mg by mouth daily. Swallow whole.   Yes [provider]  ezetimibe  (ZETIA ) 10 MG tablet TAKE 1 TABLET BY MOUTH EVERY DAY 09/28/23  Yes Burchette, Wolm ORN, MD  metoprolol  tartrate (LOPRESSOR ) 25 MG tablet TAKE 1 TABLET BY MOUTH TWICE A DAY 07/23/24  Yes Burchette, Wolm ORN, MD  Multiple Vitamin (MULTIVITAMIN WITH MINERALS) TABS tablet Take 1 tablet by mouth in the morning. Centrum   Yes [provider]  sertraline  (ZOLOFT ) 50 MG tablet TAKE 1 TABLET BY MOUTH EVERY DAY 07/23/24  Yes Burchette, Wolm ORN, MD  simvastatin  (ZOCOR ) 40 MG tablet TAKE 1 TABLET BY MOUTH EVERYDAY  AT BEDTIME 04/28/24  Yes Burchette, Wolm ORN, MD   DG Hip Unilat W or Wo Pelvis 2-3 Views Left Result Date: 08/03/2024 EXAM: 2 OR MORE VIEW(S) XRAY OF THE RIGHT HIP 08/03/2024 09:18:00 AM COMPARISON: None available. CLINICAL HISTORY: Fall FINDINGS: BONES AND JOINTS: Comminuted displaced proximal femoral shaft fracture with apex lateral angulation. Cm of shortening with 3.5 cm of medial displacement of the distal femoral shaft. Moderate right hip osteoarthritis. SOFT TISSUES: Diffuse aortoiliac and peripheral vascular atherosclerosis. LUMBAR SPINE: Multilevel degenerative changes of the lumbar spine. IMPRESSION: 1. Comminuted displaced proximal femoral shaft fracture with apex  lateral angulation, 3.5 cm of medial displacement of the distal femoral shaft, and 5 cm of foreshortening. Electronically signed by: Rogelia Myers MD 08/03/2024 09:30 AM EST RP Workstation: [id number]    Positive ROS: All other systems have been reviewed and were otherwise negative with the exception of those mentioned in the HPI and as above.  Physical Exam: General: Alert, no acute distress. Cardiovascular: No pedal edema Respiratory: No cyanosis, no use of accessory musculature GI: No organomegaly, abdomen is soft and non-tender Skin: No lesions in the area of chief complaint Neurologic: Sensation intact distally Psychiatric: Patient is competent for consent with normal mood and affect Lymphatic: No axillary or cervical lymphadenopathy  MUSCULOSKELETAL:   Examination of the left hip reveals no skin wounds or lesions. LLE flexed at hip and knee. Significant pain with any motion. TTP over trochanteric region.   Sensation intact to light touch in LE bilaterally. Motor function intact including PF, DF, and EHL. Distal pedal pulses 2+ bilaterally. Capillary refill <2 seconds. Compartments soft.   Assessment: Acute displaced left peritrochanteric femur fracture.  DVT history.   Plan: I discussed the findings with the  patient. He has acute displaced left peritrochanteric femur fracture that will require surgical fixation. Plan for IM fixation today with Dr. Edna pending labs. [facility] has already been consulted for admission and perioperative medical optimization. Continue NPO. Hold chemical DVT ppx. All questions solicited and answered.     Valery GORMAN Potters, PA-C    08/03/2024 10:44 AM.     [1] No Known Allergies  "

## 2024-08-03 NOTE — ED Notes (Signed)
 CCMD contacted to put patient on cardiac monitoring.

## 2024-08-03 NOTE — Discharge Instructions (Signed)
 Orthopedic Discharge Instructions  Diet: As you were doing prior to hospitalization   Shower:  May shower but keep the wounds dry, use an occlusive plastic wrap, NO SOAKING IN TUB.  If the bandage gets wet, change with a clean dry gauze.  If you have a splint on, leave the splint in place and keep the splint dry with a plastic bag.  Dressing:  You may change your dressing 3-5 days after surgery, unless you have a splint.  If the dressing remains clean and dry it can also be left on until follow up. If you change the dressing replace with clean gauze and tape or ace wrap. If you have a splint, then just leave the splint in place and we will change your bandages during your first follow-up appointment.  If water  gets in the splint or the splint gets saturated please call the clinic and we can see you to change your splint.  If you had hand or foot surgery, we will plan to remove your stitches in about 2 weeks in the office.  For all other surgeries, there are sticky tapes (steri-strips) on your wounds and all the stitches are absorbable.  Leave the steri-strips in place when changing your dressings, they will peel off with time, usually 2-3 weeks.  Activity:  Increase activity slowly as tolerated, but follow the weight bearing instructions below.  The rules on driving is that you can not be taking narcotics while you drive, and you must feel in control of the vehicle.    Weight Bearing:   Weightbearing as tolerated left lower extremity.    Blood clot prevention (DVT Prophylaxis): After surgery you are at an increased risk for a blood clot.  You should resume Eliquis  postop to reduce the risk of being a blood clot.  This will help prevent a blood clot. Signs of a pulmonary embolus (blood clot in the lungs) include sudden short of breath, feeling lightheaded or dizzy, chest pain with a deep breath, rapid pulse rapid breathing. Signs of a blood clot in your arms or legs include new unexplained swelling and  cramping, warm, red or darkened skin around the painful area. Please call the office or 911 right away if these signs or symptoms develop. To prevent constipation: you may use a stool softener such as -  Colace (over the counter) 100 mg by mouth twice a day  Drink plenty of fluids (prune juice may be helpful) and high fiber foods Miralax (over the counter) for constipation as needed.    Itching:  If you experience itching with your medications, try taking only a single pain pill, or even half a pain pill at a time.  You may take up to 10 pain pills per day, and you can also use benadryl over the counter for itching or also to help with sleep.   Precautions:  If you experience chest pain or shortness of breath - call 911 immediately for transfer to the hospital emergency department!!   Call office [phone]) for the following: Temperature greater than 101F Persistent nausea and vomiting Severe uncontrolled pain Redness, tenderness, or signs of infection (pain, swelling, redness, odor or green/yellow discharge around the site) Difficulty breathing, headache or visual disturbances Hives Persistent dizziness or light-headedness Extreme fatigue Any other questions or concerns you may have after discharge  In an emergency, call 911 or go to an Emergency Department at a nearby hospital  Follow- Up Appointment:  Please call for an appointment to be seen approximately  2-3 week after surgery in [facility] with your surgeon Dr. Toribio Higashi - [phone] Address: [address], [place], [place] [address]

## 2024-08-03 NOTE — ED Notes (Signed)
 ED Provider at bedside.

## 2024-08-03 NOTE — Interval H&P Note (Signed)
 The patient has been re-examined, and the chart reviewed, and there have been no interval changes to the documented history and physical.    Plan for left hip IMN for left intertrochanteric femur fracture  The operative side was examined and the patient was confirmed to have sensation to DPN, SPN, TN intact, Motor EHL, ext, flex 5/5, and DP 2+, PT 2+, No significant edema.   The risks, benefits, and alternatives have been discussed at length with patient, and the patient is willing to proceed.  Left hip marked. Consent has been signed.

## 2024-08-03 NOTE — H&P (Addendum)
 " History and Physical    Patient: John Graham FMW:[id number] DOB: 25-Dec-1945 DOA: 08/03/2024 DOS: the patient was seen and examined on 08/03/2024 PCP: Micheal Wolm ORN, MD  Patient coming from: [facility] via [facility]  Chief Complaint:  Chief Complaint  Patient presents with   Hip Pain   HPI: John Graham is a 78 y.o. male with medical history significant of hypertension, hyperlipidemia,  non-small cell lung cancer diagnosed in 2023 s/p completion of SBRT, frequent falls, Parkinson dz, left leg DVT previously on Eliquis  who presents after having a fall with left hip pain.  History is limited from the patient due to his inability to hear as well as memory.  He experienced a fall from bed while attempting to get up, landing on his hip. This incident marks the third or fourth fall in the last couple of weeks. No head injury occurred during the fall. No pain elsewhere besides the hip.  He has a history of lung cancer and was previously on blood thinners due to a blood clot in his leg. He is unsure about his current medication regimen but mentions taking medications for blood pressure and depression.  Review of records from recent admission at [facility] note Eliquis  was stopped on the 17th and he was placed on aspirin.  He has been experiencing hearing problems, which affects his ability to understand conversations. In the emergency department patient was noted to be afebrile with stable vital signs.  Labs noted hemoglobin 12.5, BUN 21, and creatinine 0.9.  X-rays revealed a left commuted displaced proximal femoral shaft fracture.  Orthopedic surgery was consulted.  Patient was given fentanyl  IV.  Review of Systems: As mentioned in the history of present illness. All other systems reviewed and are negative.  Note history is limited due to patient's memory of the events. Past Medical History:  Diagnosis Date   Anxiety    PTSD   DVT (deep venous thrombosis) (HCC) 07/2023   HOH (hard of  hearing)    no hearing aids   Hypercholesteremia    Hyperlipidemia    Hypertension    Non-small cell lung cancer [facility])    Past Surgical History:  Procedure Laterality Date   BRONCHIAL BIOPSY  02/09/2022   Procedure: BRONCHIAL BIOPSIES;  Surgeon: Brenna Adine CROME, DO;  Location: [facility];  Service: Pulmonary;;   BRONCHIAL BRUSHINGS  02/09/2022   Procedure: BRONCHIAL BRUSHINGS;  Surgeon: Brenna Adine CROME, DO;  Location: [facility];  Service: Pulmonary;;   BRONCHIAL NEEDLE ASPIRATION BIOPSY  02/09/2022   Procedure: BRONCHIAL NEEDLE ASPIRATION BIOPSIES;  Surgeon: Brenna Adine CROME, DO;  Location: [facility];  Service: Pulmonary;;   COLONOSCOPY     DENTAL SURGERY     8 dental implants   POLYPECTOMY     VIDEO BRONCHOSCOPY WITH ENDOBRONCHIAL ULTRASOUND Bilateral 02/09/2022   Procedure: VIDEO BRONCHOSCOPY WITH ENDOBRONCHIAL ULTRASOUND;  Surgeon: Brenna Adine CROME, DO;  Location: [facility];  Service: Pulmonary;  Laterality: Bilateral;   VIDEO BRONCHOSCOPY WITH RADIAL ENDOBRONCHIAL ULTRASOUND  02/09/2022   Procedure: VIDEO BRONCHOSCOPY WITH RADIAL ENDOBRONCHIAL ULTRASOUND;  Surgeon: Brenna Adine CROME, DO;  Location: [facility];  Service: Pulmonary;;   Social History:  reports that he has been smoking cigarettes. He has a 53 pack-year smoking history. He has never used smokeless tobacco. He reports that he does not drink alcohol and does not use drugs.  Allergies[1]  Family History  Problem Relation Age of Onset   Pancreatic cancer Mother    Cancer Father  Lymph Nodes   Colon cancer Neg Hx     Prior to Admission medications  Medication Sig Start Date End Date Taking? Authorizing Provider  apixaban  (ELIQUIS ) 5 MG TABS tablet Take 1 tablet (5 mg total) by mouth 2 (two) times daily. Start taking after completion of starter pack. 08/03/23   Barbarann Dixon B, RPH-CPP  carbidopa -levodopa  (SINEMET  IR) 25-100 MG tablet Take 0.5 tablet twice daily for one week, then 0.5 tablet three  times daily for one week, then increase to 1 tablet three times daily.  Take each dose at same time daily, at least one hour before meals. 07/31/24   Skeet, Adam R, DO  ezetimibe  (ZETIA ) 10 MG tablet TAKE 1 TABLET BY MOUTH EVERY DAY 09/28/23   Burchette, Wolm ORN, MD  metoprolol  tartrate (LOPRESSOR ) 25 MG tablet TAKE 1 TABLET BY MOUTH TWICE A DAY 07/23/24   Burchette, Wolm ORN, MD  Multiple Minerals-Vitamins (CAL MAG ZINC +D3 PO) Take 1 tablet by mouth every evening.    [provider]  Multiple Vitamin (MULTIVITAMIN WITH MINERALS) TABS tablet Take 1 tablet by mouth in the morning. Centrum    [provider]  sertraline  (ZOLOFT ) 50 MG tablet TAKE 1 TABLET BY MOUTH EVERY DAY 07/23/24   Burchette, Wolm ORN, MD  simvastatin  (ZOCOR ) 40 MG tablet TAKE 1 TABLET BY MOUTH EVERYDAY AT BEDTIME 04/28/24   Burchette, Wolm ORN, MD    Physical Exam: Vitals:   08/03/24 0801 08/03/24 0900  BP: (!) 154/86 139/72  Pulse: 75 65  Resp: 19 (!) 21  Temp: (!) 97.5 F (36.4 C)   TempSrc: Oral   SpO2: 100% 100%   Constitutional: Elderly male who appears to be in no acute distress at this time Eyes: PERRL, lids and conjunctivae normal ENMT: Mucous membranes are moist .hard of hearing. Neck: normal, supple,  Respiratory: clear to auscultation bilaterally, no wheezing, no crackles. Normal respiratory effort. No accessory muscle use.  Cardiovascular: Regular rate and rhythm, no murmurs / rubs / gallops. No extremity edema. 2+ pedal pulses.   Abdomen: no tenderness, no masses palpated. No hepatosplenomegaly. Bowel sounds positive.  Musculoskeletal: no clubbing / cyanosis.  Left leg is externally rotated and shortened. Skin: no rashes, lesions, ulcers. No induration Neurologic: CN 2-12 grossly intact.  Able to move all extremities.  Tremor present. Psychiatric: Patient is alert and oriented to person and place, but appears to have memory loss  Data Reviewed:  Initial EKG reveals sinus rhythm at 74 bpm  with prolonged QT interval of 514.  Reviewed labs, imaging, and pertinent records as documented.  Assessment and Plan:  Left hip fracture secondary to fall Patient reports having frequent falls, and reportedly since slid out of bed today injuring his left leg.  X-rays revealed a left commuted displaced proximal femoral shaft fracture.  Orthopedics was consulted and plan likely take him to surgery today for correction.  Likely related to history of Parkinson's. - Admit to telemetry bed - Hip fracture order set utilized - N.p.o. for procedure - Hydrocodone /morphine  as needed for moderate to severe pain - Bowel regimen - Transitions of care consulted - Appreciate orthopedic consultative services, follow-up for any further recommendation  Parkinson's disease Patient does appear to have issues with his memory unsure of notable events that are in his chart.  Seen by Dr. Skeet of neurology on 12/18 for which increasing tremor was noted and recommended to be started on carbidopa  levodopa . - Delirium precautions. - Unclear if patient has formally started on carbidopa   levodopa  at this time  History of DVT Patient has a history of a left leg DVT in 07/2023 for which he was on anticoagulation, but records appear to show that anticoagulation was discontinued on 12/17 and he was started on aspirin.  Daughter makes note that he has not been on anticoagulation in over - Continue aspirin  Essential hypertension Blood pressure was initially 122/74 - 154/86. - Continue metoprolol  25 mg twice daily  History of lung cancer Patient status post SBRT treatment back in 2023.  Management was reported the patient declined adjunctive therapy at that time.  Depression - Continue Zoloft   Hyperlipidemia - Continue simvastatin   Prolonged QT interval Improved.  QTc initially 514, but repeat EKG revealed QTc improved to 471. - Correct any electrolyte abnormalities - Continue to monitor  DVT prophylaxis:  Lovenox  Advance Care Planning:   Code Status: Full Code    Consults: Orthopedic surgery  Family Communication: Daughter updated over the phone  Severity of Illness: The appropriate patient status for this patient is INPATIENT. Inpatient status is judged to be reasonable and necessary in order to provide the required intensity of service to ensure the patient's safety. The patient's presenting symptoms, physical exam findings, and initial radiographic and laboratory data in the context of their chronic comorbidities is felt to place them at high risk for further clinical deterioration. Furthermore, it is not anticipated that the patient will be medically stable for discharge from the hospital within 2 midnights of admission.   * I certify that at the point of admission it is my clinical judgment that the patient will require inpatient hospital care spanning beyond 2 midnights from the point of admission due to high intensity of service, high risk for further deterioration and high frequency of surveillance required.*  Author: Maximino DELENA Sharps, MD 08/03/2024 10:03 AM  For on call review [email or web address].      [1] No Known Allergies  "

## 2024-08-03 NOTE — Op Note (Signed)
 DATE OF SURGERY:  08/03/2024  TIME: 4:14 PM  PATIENT NAME:  John Graham  AGE: 78 y.o.  PRE-OPERATIVE DIAGNOSIS: Left subtrochanteric femur fracture  POST-OPERATIVE DIAGNOSIS:  SAME  PROCEDURE: ORIF left subtrochanteric femur fracture with cephalomedullary nail  SURGEON:  Graziella Connery A Stuti Sandin  ASSISTANT: None  OPERATIVE IMPLANTS:  [employer] Intertan Nail 10 x 420 mm , 105/100 lag compression screw, 2 distal interlocks  Implant Name Type Inv. Item Serial No. Manufacturer Lot No. LRB No. Used Action  NAIL LOCK CANN [id number] 130D LT - [id number] Nail NAIL LOCK CANN 10X420 130D LT  [employer] [id number] Left 1 Implanted  SCREW LAG COMPR KIT 105/100 - [id number] Screw SCREW LAG COMPR KIT 105/100  [employer] [id number] Left 1 Implanted  SCREW TRIGEN LOW PROF 5.0X40 - [id number] Screw SCREW TRIGEN LOW PROF 5.0X40  [employer] [id number] Left 1 Implanted  SCREW TRIGEN LOW PROF 5.0X45 - [id number] Screw SCREW TRIGEN LOW PROF 5.0X45  [employer] [id number] Left 1 Implanted    UNIQUE ASPECTS OF THE CASE: Unable to close reduce the fracture with traction and manipulation elected to open the fracture site and placed a verbruge clamp around the fracture while nail was placed  ESTIMATED BLOOD LOSS: 150cc  PREOPERATIVE INDICATIONS:  TAESHAUN Graham is a 78 y.o. year old who fell and suffered an LEFT HIP FRACTURE. He was brought into the ER and then admitted and optimized and then elected for surgical intervention.    The risks benefits and alternatives were discussed with the patient including but not limited to the risks of nonoperative treatment, versus surgical intervention including infection, bleeding, nerve injury, malunion, nonunion, hardware prominence, hardware failure, need for hardware removal, blood clots, cardiopulmonary complications, morbidity, mortality, among others, and they were willing to proceed.     OPERATIVE PROCEDURE:  The patient was brought to the operating room and placed in the supine position. Anesthesia was administered. He was placed on the fracture table.  Closed reduction was performed under C-arm guidance.  Notably the fracture was translated posteriorly significantly with close manipulation.  Time out was then performed after sterile prep and drape. He received preoperative antibiotics.  Small incision was made just lateral to the fracture.  Was able to palpate the fracture pieces and placed a Burbridge clamp and able to hold the fracture anatomically while the nail was placed. Small incision proximal to the greater trochanter was made and carried down through skin and subcutaneous tissue.  Threaded guidewire was directed at the tip of the greater trochanter and advanced into the proximal metaphysis.  Positioning was confirmed with fluoroscopy.  I then used an entry reamer to enter the medullary canal.  I reamed sequentially the canal from 9 mm to 11.5 mm which had good cortical chatter.  I then passed a 10 x 420 mm InterTAN down the center of the canal attached to the targeting arm.  I then used the targeting arm to make a percutaneous incision and directed a threaded guidewire up into the head/neck segment.  I confirmed adequate tip apex distance and measured the length.  I decided to place a 105 mm screw.  I then drilled the path for the compression screw and placed an antirotation bar.  I then placed the lag screw and statically locked the nail.  I used the targeting arm to place a distal interlocking screw.  The targeting arm was removed.  Final fluoroscopic imaging was obtained.  The wounds were irrigated copiously, and vancomycin powder was placed in the wounds.  The gluteal fascia was closed with 0 Vicryl, and skin was closed with 2-0 Vicryl and 3-0 Monocryl.  Sterile dressing was applied with Dermabond, 4 x 4 gauze, and Tegaderm.  The patient was awakened and returned to  PACU in stable and satisfactory condition. There were no complications and the patient tolerated the procedure well.   Post op recs: WB: WBAT LLE Abx: ancef  x23 hours post op Imaging: PACU xrays Dressing: keep intact until follow up, change PRN if soiled or saturated. DVT prophylaxis: Resume Eliquis  postop 2.5 mg twice daily Follow up: 2 weeks after surgery for a wound check with Dr. Edna at [facility].  Address: [address], [place], [place] [address]  Office Phone: [phone]  Toribio Edna, MD Orthopaedic Surgery

## 2024-08-03 NOTE — H&P (View-Only) (Signed)
 "   ORTHOPAEDIC CONSULTATION  REQUESTING PHYSICIAN: Claudene Maximino LABOR, MD  PCP:  Micheal Wolm ORN, MD  Chief Complaint: left hip pain, fall.    HPI: John Graham is a 78 y.o. male PMH DVT, parkinson's, frequent falls who reports to [facility] ED after a fall on his left side. Recent admission 07/28/24 for fall, and discharged to SNF. Patient is a poor historian. He reports he slid out of bed onto the ground on his left side. He denies hitting his head or LOC. He denies any tingling or numbness in LE bilaterally. Question if he has been taking Eliquis  for DVT, per paperwork at bedside, Eliquis  was discontinued and he has been on aspirin 81mg  daily. Currently NPO.     Past Medical History:  Diagnosis Date   Anxiety    PTSD   DVT (deep venous thrombosis) (HCC) 07/2023   HOH (hard of hearing)    no hearing aids   Hypercholesteremia    Hyperlipidemia    Hypertension    Non-small cell lung cancer [facility])    Past Surgical History:  Procedure Laterality Date   BRONCHIAL BIOPSY  02/09/2022   Procedure: BRONCHIAL BIOPSIES;  Surgeon: Brenna Adine CROME, DO;  [facility];  Service: Pulmonary;;   BRONCHIAL BRUSHINGS  02/09/2022   Procedure: BRONCHIAL BRUSHINGS;  Surgeon: Brenna Adine CROME, DO;  [facility]: [facility];  Service: Pulmonary;;   BRONCHIAL NEEDLE ASPIRATION BIOPSY  02/09/2022   Procedure: BRONCHIAL NEEDLE ASPIRATION BIOPSIES;  Surgeon: Brenna Adine CROME, DO;  [facility];  Service: Pulmonary;;   COLONOSCOPY     DENTAL SURGERY     8 dental implants   POLYPECTOMY     VIDEO BRONCHOSCOPY WITH ENDOBRONCHIAL ULTRASOUND Bilateral 02/09/2022   Procedure: VIDEO BRONCHOSCOPY WITH ENDOBRONCHIAL ULTRASOUND;  Surgeon: Brenna Adine CROME, DO;  [facility]: [facility];  Service: Pulmonary;  Laterality: Bilateral;   VIDEO BRONCHOSCOPY WITH RADIAL ENDOBRONCHIAL ULTRASOUND  02/09/2022   Procedure: VIDEO BRONCHOSCOPY WITH RADIAL ENDOBRONCHIAL ULTRASOUND;  Surgeon: Brenna Adine CROME, DO;   [facility]: [facility];  Service: Pulmonary;;   Social History   Socioeconomic History   Marital status: Married    Spouse name: Not on file   Number of children: Not on file   Years of education: Not on file   Highest education level: Not on file  Occupational History   Not on file  Tobacco Use   Smoking status: Every Day    Current packs/day: 1.00    Average packs/day: 1 pack/day for 53.0 years (53.0 ttl pk-yrs)    Types: Cigarettes   Smokeless tobacco: Never   Tobacco comments:    20 cigarettes smoked daily 02/17/2022 hfb  Vaping Use   Vaping status: Never Used  Substance and Sexual Activity   Alcohol use: No   Drug use: No   Sexual activity: Not on file  Other Topics Concern   Not on file  Social History Narrative   ** Merged History Encounter **    Left handed    Control and instrumentation engineer    Social Drivers of Health   Tobacco Use: High Risk (08/03/2024)   Patient History    Smoking Tobacco Use: Every Day    Smokeless Tobacco Use: Never    Passive Exposure: Not on file  Financial Resource Strain: Low Risk (03/02/2023)   Overall Financial Resource Strain (CARDIA)    Difficulty of Paying Living Expenses: Not hard at all  Food Insecurity: No Food Insecurity (03/02/2023)   Hunger Vital Sign  Worried About Programme Researcher, Broadcasting/film/video in the Last Year: Never true    Ran Out of Food in the Last Year: Never true  Transportation Needs: No Transportation Needs (03/02/2023)   PRAPARE - Administrator, Civil Service (Medical): No    Lack of Transportation (Non-Medical): No  Physical Activity: Inactive (03/02/2023)   Exercise Vital Sign    Days of Exercise per Week: 0 days    Minutes of Exercise per Session: 0 min  Stress: No Stress Concern Present (03/02/2023)   [employer] of Occupational Health - Occupational Stress Questionnaire    Feeling of Stress : Not at all  Social Connections: Socially Integrated (03/02/2023)   Social Connection and Isolation Panel     Frequency of Communication with Friends and Family: More than three times a week    Frequency of Social Gatherings with Friends and Family: More than three times a week    Attends Religious Services: More than 4 times per year    Active Member of Clubs or Organizations: Yes    Attends Banker Meetings: More than 4 times per year    Marital Status: Married  Depression (PHQ2-9): Low Risk (08/02/2023)   Depression (PHQ2-9)    PHQ-2 Score: 0  Alcohol Screen: Low Risk (03/02/2023)   Alcohol Screen    Last Alcohol Screening Score (AUDIT): 0  Housing: Low Risk (03/02/2023)   Housing    Last Housing Risk Score: 0  Utilities: Not At Risk (03/02/2023)   AHC Utilities    Threatened with loss of utilities: No  Health Literacy: Adequate Health Literacy (03/02/2023)   B1300 Health Literacy    Frequency of need for help with medical instructions: Never   Family History  Problem Relation Age of Onset   Pancreatic cancer Mother    Cancer Father        Lymph Nodes   Colon cancer Neg Hx    Allergies[1] Prior to Admission medications  Medication Sig Start Date End Date Taking? Authorizing Provider  apixaban  (ELIQUIS ) 5 MG TABS tablet Take 1 tablet (5 mg total) by mouth 2 (two) times daily. Start taking after completion of starter pack. 08/03/23  Yes Barbarann Dixon B, RPH-CPP  aspirin EC 81 MG tablet Take 81 mg by mouth daily. Swallow whole.   Yes [provider]  ezetimibe  (ZETIA ) 10 MG tablet TAKE 1 TABLET BY MOUTH EVERY DAY 09/28/23  Yes Burchette, Wolm ORN, MD  metoprolol  tartrate (LOPRESSOR ) 25 MG tablet TAKE 1 TABLET BY MOUTH TWICE A DAY 07/23/24  Yes Burchette, Wolm ORN, MD  Multiple Vitamin (MULTIVITAMIN WITH MINERALS) TABS tablet Take 1 tablet by mouth in the morning. Centrum   Yes [provider]  sertraline  (ZOLOFT ) 50 MG tablet TAKE 1 TABLET BY MOUTH EVERY DAY 07/23/24  Yes Burchette, Wolm ORN, MD  simvastatin  (ZOCOR ) 40 MG tablet TAKE 1 TABLET BY MOUTH EVERYDAY  AT BEDTIME 04/28/24  Yes Burchette, Wolm ORN, MD   DG Hip Unilat W or Wo Pelvis 2-3 Views Left Result Date: 08/03/2024 EXAM: 2 OR MORE VIEW(S) XRAY OF THE RIGHT HIP 08/03/2024 09:18:00 AM COMPARISON: None available. CLINICAL HISTORY: Fall FINDINGS: BONES AND JOINTS: Comminuted displaced proximal femoral shaft fracture with apex lateral angulation. Cm of shortening with 3.5 cm of medial displacement of the distal femoral shaft. Moderate right hip osteoarthritis. SOFT TISSUES: Diffuse aortoiliac and peripheral vascular atherosclerosis. LUMBAR SPINE: Multilevel degenerative changes of the lumbar spine. IMPRESSION: 1. Comminuted displaced proximal femoral shaft fracture with apex  lateral angulation, 3.5 cm of medial displacement of the distal femoral shaft, and 5 cm of foreshortening. Electronically signed by: Rogelia Myers MD 08/03/2024 09:30 AM EST RP Workstation: [id number]    Positive ROS: All other systems have been reviewed and were otherwise negative with the exception of those mentioned in the HPI and as above.  Physical Exam: General: Alert, no acute distress. Cardiovascular: No pedal edema Respiratory: No cyanosis, no use of accessory musculature GI: No organomegaly, abdomen is soft and non-tender Skin: No lesions in the area of chief complaint Neurologic: Sensation intact distally Psychiatric: Patient is competent for consent with normal mood and affect Lymphatic: No axillary or cervical lymphadenopathy  MUSCULOSKELETAL:   Examination of the left hip reveals no skin wounds or lesions. LLE flexed at hip and knee. Significant pain with any motion. TTP over trochanteric region.   Sensation intact to light touch in LE bilaterally. Motor function intact including PF, DF, and EHL. Distal pedal pulses 2+ bilaterally. Capillary refill <2 seconds. Compartments soft.   Assessment: Acute displaced left peritrochanteric femur fracture.  DVT history.   Plan: I discussed the findings with the  patient. He has acute displaced left peritrochanteric femur fracture that will require surgical fixation. Plan for IM fixation today with Dr. Edna pending labs. [facility] has already been consulted for admission and perioperative medical optimization. Continue NPO. Hold chemical DVT ppx. All questions solicited and answered.     Valery GORMAN Potters, PA-C    08/03/2024 10:44 AM.     [1] No Known Allergies  "

## 2024-08-03 NOTE — Anesthesia Preprocedure Evaluation (Addendum)
"                                    Anesthesia Evaluation  Patient identified by MRN, date of birth, ID band Patient awake    Reviewed: Allergy & Precautions, H&P , NPO status , Patient's Chart, lab work & pertinent test results  History of Anesthesia Complications Negative for: history of anesthetic complications  Airway Mallampati: II  TM Distance: >3 FB Neck ROM: Full    Dental no notable dental hx.    Pulmonary neg COPD, Current Smoker and Patient abstained from smoking. Non-small cell lung CA   Pulmonary exam normal breath sounds clear to auscultation       Cardiovascular hypertension, Normal cardiovascular exam Rhythm:Regular Rate:Normal  DVT in 2024 on [facility]   Neuro/Psych neg Seizures PSYCHIATRIC DISORDERS Anxiety Depression    Parkinson's disease    GI/Hepatic negative GI ROS, Neg liver ROS,,,  Endo/Other  negative endocrine ROS    Renal/GU negative Renal ROS  negative genitourinary   Musculoskeletal  (+) Arthritis ,  Left hip fx   Abdominal   Peds negative pediatric ROS (+)  Hematology negative hematology ROS (+)   Anesthesia Other Findings   Reproductive/Obstetrics negative OB ROS                              Anesthesia Physical Anesthesia Plan  ASA: 3  Anesthesia Plan: General   Post-op Pain Management: Tylenol  PO (pre-op)*   Induction: Intravenous  PONV Risk Score and Plan: 1 and Ondansetron  and Dexamethasone   Airway Management Planned: Oral ETT  Additional Equipment: None  Intra-op Plan:   Post-operative Plan: Extubation in OR  Informed Consent: I have reviewed the patients History and Physical, chart, labs and discussed the procedure including the risks, benefits and alternatives for the proposed anesthesia with the patient or authorized representative who has indicated his/her understanding and acceptance.     Dental advisory given  Plan Discussed with: CRNA  Anesthesia Plan Comments:          Anesthesia Quick Evaluation  "

## 2024-08-03 NOTE — Anesthesia Procedure Notes (Signed)
 Procedure Name: Intubation Date/Time: 08/03/2024 2:49 PM  Performed by: Chalyn Amescua J, CRNAPre-anesthesia Checklist: Patient identified, Emergency Drugs available, Suction available and Patient being monitored Patient Re-evaluated:Patient Re-evaluated prior to induction Oxygen Delivery Method: Circle System Utilized Preoxygenation: Pre-oxygenation with 100% oxygen Induction Type: IV induction Ventilation: Mask ventilation without difficulty, Oral airway inserted - appropriate to patient size and Two handed mask ventilation required Laryngoscope Size: Miller and 3 Grade View: Grade I Tube type: Oral Tube size: 7.5 mm Number of attempts: 1 Airway Equipment and Method: Stylet and Oral airway Placement Confirmation: ETT inserted through vocal cords under direct vision, positive ETCO2 and breath sounds checked- equal and bilateral Secured at: 23 cm Tube secured with: Tape Dental Injury: Teeth and Oropharynx as per pre-operative assessment

## 2024-08-04 ENCOUNTER — Encounter (HOSPITAL_COMMUNITY): Payer: Self-pay | Admitting: Orthopedic Surgery

## 2024-08-04 LAB — BASIC METABOLIC PANEL WITH GFR
Anion gap: 10 (ref 5–15)
BUN: 27 mg/dL — ABNORMAL HIGH (ref 8–23)
CO2: 26 mmol/L (ref 22–32)
Calcium: 8.7 mg/dL — ABNORMAL LOW (ref 8.9–10.3)
Chloride: 106 mmol/L (ref 98–111)
Creatinine, Ser: 1.08 mg/dL (ref 0.61–1.24)
GFR, Estimated: >60 mL/min
Glucose, Bld: 118 mg/dL — ABNORMAL HIGH (ref 70–99)
Potassium: 4.3 mmol/L (ref 3.5–5.1)
Sodium: 141 mmol/L (ref 135–145)

## 2024-08-04 LAB — CBC
HCT: 31.9 % — ABNORMAL LOW (ref 39.0–52.0)
Hemoglobin: 10.9 g/dL — ABNORMAL LOW (ref 13.0–17.0)
MCH: 31.7 pg (ref 26.0–34.0)
MCHC: 34.2 g/dL (ref 30.0–36.0)
MCV: 92.7 fL (ref 80.0–100.0)
Platelets: 177 K/uL (ref 150–400)
RBC: 3.44 MIL/uL — ABNORMAL LOW (ref 4.22–5.81)
RDW: 13.5 % (ref 11.5–15.5)
WBC: 6.8 K/uL (ref 4.0–10.5)
nRBC: 0 % (ref 0.0–0.2)

## 2024-08-04 MED ORDER — APIXABAN 2.5 MG PO TABS
2.5000 mg | ORAL_TABLET | Freq: Two times a day (BID) | ORAL | Status: DC
  Administered 2024-08-04 – 2024-08-07 (×7): 2.5 mg via ORAL
  Filled 2024-08-04 (×7): qty 1

## 2024-08-04 NOTE — Plan of Care (Signed)
  Problem: Pain Managment: Goal: General experience of comfort will improve and/or be controlled Outcome: Progressing   Problem: Safety: Goal: Ability to remain free from injury will improve Outcome: Progressing

## 2024-08-04 NOTE — Evaluation (Signed)
 Physical Therapy Evaluation Patient Details Name: John Graham MRN: [id number] DOB: 03/04/1946 Today's Date: 08/04/2024  History of Present Illness  John Graham is a 78 yo male who presented from [facility] after a fall OOB. Found to have Left subtrochanteric femur fracture, he is s/p ORIF 12/21. PMH positive for recent admit 12/15 for falls, Parkinson's disease, HLD, HTN, arthritis, hearing loss, NCSC R lung, h/o L LE DVT.  Clinical Impression  PTA, pt with recent d/c to [facility]. Pt presents with LLE pain and weakness, cognitive deficits, impaired standing balance. Pt transitioning to edge of bed with maxA +2, but able to stand with minA + 2 to RW. Has difficulty with weight shifting. Attempted to utilize Stedy to transfer from bed to chair, but pt reports having a hot flash, and requested to lie back down. He was given IV pain medication at start of session. Suspect he will progress well with mobility with pain control and management. Patient will benefit from continued inpatient follow up therapy, <3 hours/day to address deficits, maximize functional mobility and decrease caregiver burden.    If plan is discharge home, recommend the following: Two people to help with bathing/dressing/bathroom;Two people to help with walking and/or transfers   Can travel by private vehicle   No    Equipment Recommendations Other (comment) (defer)  Recommendations for Other Services       Functional Status Assessment Patient has had a recent decline in their functional status and demonstrates the ability to make significant improvements in function in a reasonable and predictable amount of time.     Precautions / Restrictions Precautions Precautions: Fall Restrictions Weight Bearing Restrictions Per Provider Order: Yes LLE Weight Bearing Per Provider Order: Weight bearing as tolerated      Mobility  Bed Mobility Overal bed mobility: Needs Assistance Bed Mobility: Supine to Sit,  Sit to Supine     Supine to sit: Max assist, +2 for physical assistance, +2 for safety/equipment Sit to supine: Max assist, +2 for physical assistance, +2 for safety/equipment        Transfers Overall transfer level: Needs assistance Equipment used: Rolling walker (2 wheels) Transfers: Sit to/from Stand Sit to Stand: Min assist, +2 physical assistance, +2 safety/equipment           General transfer comment: unable to progress to stepping    Ambulation/Gait                  Stairs            Wheelchair Mobility     Tilt Bed    Modified Rankin (Stroke Patients Only)       Balance Overall balance assessment: Needs assistance Sitting-balance support: Feet supported Sitting balance-Leahy Scale: Fair     Standing balance support: Bilateral upper extremity supported, During functional activity Standing balance-Leahy Scale: Poor                               Pertinent Vitals/Pain Pain Assessment Pain Assessment: Faces Faces Pain Scale: Hurts little more Pain Location: LLE Pain Descriptors / Indicators: Discomfort, Grimacing Pain Intervention(s): Limited activity within patient's tolerance, Monitored during session, RN gave pain meds during session    Home Living Family/patient expects to be discharged to:: Private residence Living Arrangements: Children;Other relatives Available Help at Discharge: Family Type of Home: House         Home Layout: One level   Additional Comments: recent  d/c to SNF - prior to December he was home with dtr    Prior Function Prior Level of Function : Needs assist             Mobility Comments: RW and reports at least 5 recent falls - unsure of mobility at SNF ADLs Comments: pt states he sponge bathes at baseline, dresses and toilets without assist. Unsure of functional level at [facility]     Extremity/Trunk Assessment   Upper Extremity Assessment Upper Extremity Assessment: Generalized weakness     Lower Extremity Assessment Lower Extremity Assessment: LLE deficits/detail LLE Deficits / Details: L femur fx s/p ORIF. Grossly weak    Cervical / Trunk Assessment Cervical / Trunk Assessment: Kyphotic  Communication   Communication Communication: Impaired Factors Affecting Communication: Hearing impaired    Cognition Arousal: Alert Behavior During Therapy: WFL for tasks assessed/performed   PT - Cognitive impairments: No family/caregiver present to determine baseline, Difficult to assess, Awareness, Memory, Attention, Problem solving Difficult to assess due to: Hard of hearing/deaf                     PT - Cognition Comments: perseverating on unrelated topics, followed most simple 1 step commands, limited insight, does not remember having surgery but remembers being told he had surgery. oriented x4 Following commands: Impaired Following commands impaired: Follows one step commands with increased time     Cueing Cueing Techniques: Verbal cues     General Comments General comments (skin integrity, edema, etc.): VSS - tele off on arrival, put leads back on pt    Exercises     Assessment/Plan    PT Assessment Patient needs continued PT services  PT Problem List Decreased strength;Decreased balance;Decreased mobility;Decreased knowledge of precautions;Decreased safety awareness;Decreased activity tolerance       PT Treatment Interventions DME instruction;Gait training;Patient/family education;Functional mobility training;Therapeutic activities;Therapeutic exercise;Balance training    PT Goals (Current goals can be found in the Care Plan section)  Acute Rehab PT Goals Patient Stated Goal: pt daughter would like him to transfer back to [facility] PT Goal Formulation: With patient/family Time For Goal Achievement: 08/18/24 Potential to Achieve Goals: Fair    Frequency Min 2X/week     Co-evaluation PT/OT/SLP Co-Evaluation/Treatment: Yes Reason for  Co-Treatment: Complexity of the patient's impairments (multi-system involvement);To address functional/ADL transfers;For patient/therapist safety PT goals addressed during session: Mobility/safety with mobility;Balance OT goals addressed during session: ADL's and self-care       AM-PAC PT 6 Clicks Mobility  Outcome Measure Help needed turning from your back to your side while in a flat bed without using bedrails?: A Lot Help needed moving from lying on your back to sitting on the side of a flat bed without using bedrails?: A Lot Help needed moving to and from a bed to a chair (including a wheelchair)?: Total Help needed standing up from a chair using your arms (e.g., wheelchair or bedside chair)?: A Lot Help needed to walk in hospital room?: Total Help needed climbing 3-5 steps with a railing? : Total 6 Click Score: 9    End of Session Equipment Utilized During Treatment: Gait belt Activity Tolerance: Patient tolerated treatment well Patient left: in bed;with call bell/phone within reach;with bed alarm set Nurse Communication: Mobility status PT Visit Diagnosis: Muscle weakness (generalized) (M62.81);History of falling (Z91.81);Other abnormalities of gait and mobility (R26.89);Pain Pain - Right/Left: Left Pain - part of body: Hip    Time: 1351-1415 PT Time Calculation (min) (ACUTE ONLY): 24 min  Charges:   PT Evaluation $PT Eval Low Complexity: 1 Low   PT General Charges $$ ACUTE PT VISIT: 1 Visit         Aleck Daring, PT, DPT Acute Rehabilitation Services Office [phone]   Aleck ONEIDA Daring 08/04/2024, 4:00 PM

## 2024-08-04 NOTE — Progress Notes (Signed)
 Transition of Care [facility]) - CAGE-AID Screening   Patient Details  Name: John Graham MRN: [id number] Date of Birth: [birth year]  Transition of Care [place]) CM/SW Contact:    Sallyanne MALVA Mettle, RN Phone Number: 08/04/2024, 10:22 PM     CAGE-AID Screening:    Have You Ever Felt You Ought to Cut Down on Your Drinking or Drug Use?: No Have People Annoyed You By Critizing Your Drinking Or Drug Use?: No Have You Felt Bad Or Guilty About Your Drinking Or Drug Use?: No Have You Ever Had a Drink or Used Drugs First Thing In The Morning to Steady Your Nerves or to Get Rid of a Hangover?: No CAGE-AID Score: 0  Substance Abuse Education Offered: No (Reports no alcohol or drug use, no resources indicated)

## 2024-08-04 NOTE — Anesthesia Postprocedure Evaluation (Signed)
"   Anesthesia Post Note  Patient: John Graham  Procedure(s) Performed: FIXATION, FRACTURE, INTERTROCHANTERIC, WITH INTRAMEDULLARY ROD (Left: Hip)     Patient location during evaluation: PACU Anesthesia Type: General Level of consciousness: awake and alert Pain management: pain level controlled Vital Signs Assessment: post-procedure vital signs reviewed and stable Respiratory status: spontaneous breathing, nonlabored ventilation, respiratory function stable and patient connected to nasal cannula oxygen Cardiovascular status: blood pressure returned to baseline and stable Postop Assessment: no apparent nausea or vomiting Anesthetic complications: no   No notable events documented.  Last Vitals:  Vitals:   08/04/24 0438 08/04/24 0742  BP: 128/75 133/79  Pulse: 76 89  Resp: 20 19  Temp: 37 C 36.8 C  SpO2: 96% 97%    Last Pain:  Vitals:   08/04/24 0603  TempSrc:   PainSc: 1                  [name]      "

## 2024-08-04 NOTE — Progress Notes (Signed)
 " PROGRESS NOTE    John Graham  FMW:[id number] DOB: 10-26-1945 DOA: 08/03/2024 PCP: Micheal Wolm ORN, MD  Subjective: Patient reports feeling okay this morning, was not aware that he had surgery yesterday until told by the ortho team this morning. He denied any pain currently  Hospital Course: 78 y.o. male with medical history significant of hypertension, hyperlipidemia,  non-small cell lung cancer diagnosed in 2023 s/p completion of SBRT, frequent falls, Parkinson dz, left leg DVT previously on Eliquis  who presents after having a fall with left hip pain. Xray showed a left commuted displaced proximal femoral shaft fracture, and ortho was consulted. He underwent ORIF on 12/21     Assessment and Plan:  Left hip fracture secondary to fall Patient reports having frequent falls, and reportedly since slid out of bed today injuring his left leg.  X-rays revealed a left commuted displaced proximal femoral shaft fracture. Likely related to history of Parkinson's. Ortho was consulted - s/p ORIF on 08/03/24 - pain control with PRN norco and morphine   - PT/OT eval    Parkinson's disease Patient does appear to have issues with his memory unsure of notable events that are in his chart.  Seen by Dr. Skeet of neurology on 12/18 for which increasing tremor was noted and recommended to be started on carbidopa  levodopa . - Delirium precautions. - Unclear if patient has formally started on carbidopa  levodopa  at this time   History of DVT Patient has a history of a left leg DVT in 07/2023 for which he was on anticoagulation, but records appear to show that anticoagulation was discontinued on 12/17 and he was started on aspirin.  Daughter makes note that he has not been on anticoagulation in over - for now he is on eliquis  2.5 mg BID for DVT Ppx post op, will f/u with ortho with recs for discharge    Essential hypertension Blood pressure was initially 122/74 - 154/86. - Continue metoprolol  25 mg twice  daily   History of lung cancer Patient status post SBRT treatment back in 2023.  Management was reported the patient declined adjunctive therapy at that time.   Depression - Continue Zoloft    Hyperlipidemia - Continue simvastatin    Prolonged QT interval Improved.  QTc initially 514, but repeat EKG revealed QTc improved to 471. - Correct any electrolyte abnormalities - Continue to monitor   DVT prophylaxis: apixaban  (ELIQUIS ) tablet 2.5 mg Start: 08/04/24 1000 apixaban  (ELIQUIS ) tablet 2.5 mg    Code Status: Full Code Disposition Plan: TBD Reason for continuing need for hospitalization: PT/OT follow up   Objective: Vitals:   08/03/24 1807 08/03/24 2121 08/04/24 0438 08/04/24 0742  BP: (!) 148/68 (!) 145/89 128/75 133/79  Pulse: 91 96 76 89  Resp: 16  20 19   Temp:  97.8 F (36.6 C) 98.6 F (37 C) 98.2 F (36.8 C)  TempSrc:  Oral Oral   SpO2: 93% 95% 96% 97%    Intake/Output Summary (Last 24 hours) at 08/04/2024 1608 Last data filed at 08/04/2024 1300 Gross per 24 hour  Intake 1040 ml  Output 625 ml  Net 415 ml   There were no vitals filed for this visit.  Examination:  Physical Exam Vitals and nursing note reviewed.  Constitutional:      General: He is not in acute distress. Cardiovascular:     Rate and Rhythm: Normal rate.  Pulmonary:     Effort: No respiratory distress.     Breath sounds: No wheezing.  Abdominal:  General: There is no distension.     Tenderness: There is no abdominal tenderness.  Skin:    Comments: Surgical dressings in place, appears clean and dry      Data Reviewed: I have personally reviewed following labs and imaging studies  CBC: Recent Labs  Lab 08/03/24 0816 08/04/24 0419  WBC 7.0 6.8  HGB 12.5* 10.9*  HCT 38.6* 31.9*  MCV 96.3 92.7  PLT 183 177   Basic Metabolic Panel: Recent Labs  Lab 08/03/24 0816 08/04/24 0419  NA 140 141  K 4.5 4.3  CL 106 106  CO2 24 26  GLUCOSE 114* 118*  BUN 21 27*  CREATININE  0.90 1.08  CALCIUM 8.8* 8.7*   GFR: Estimated Creatinine Clearance: 66.1 mL/min (by C-G formula based on SCr of 1.08 mg/dL). Liver Function Tests: No results for input(s): AST, ALT, ALKPHOS, BILITOT, PROT, ALBUMIN in the last 168 hours. No results for input(s): LIPASE, AMYLASE in the last 168 hours. No results for input(s): AMMONIA in the last 168 hours. Coagulation Profile: Recent Labs  Lab 08/03/24 1215  INR 1.1   Cardiac Enzymes: No results for input(s): CKTOTAL, CKMB, CKMBINDEX, TROPONINI in the last 168 hours. ProBNP, BNP (last 5 results) No results for input(s): PROBNP, BNP in the last 8760 hours. HbA1C: No results for input(s): HGBA1C in the last 72 hours. CBG: No results for input(s): GLUCAP in the last 168 hours. Lipid Profile: No results for input(s): CHOL, HDL, LDLCALC, TRIG, CHOLHDL, LDLDIRECT in the last 72 hours. Thyroid  Function Tests: No results for input(s): TSH, T4TOTAL, FREET4, T3FREE, THYROIDAB in the last 72 hours. Anemia Panel: No results for input(s): VITAMINB12, FOLATE, FERRITIN, TIBC, IRON, RETICCTPCT in the last 72 hours. Sepsis Labs: No results for input(s): PROCALCITON, LATICACIDVEN in the last 168 hours.  Recent Results (from the past 240 hours)  Surgical pcr screen     Status: None   Collection Time: 08/03/24  2:01 PM   Specimen: Nasal Mucosa; Nasal Swab  Result Value Ref Range Status   MRSA, PCR NEGATIVE NEGATIVE Final   Staphylococcus aureus NEGATIVE NEGATIVE Final    Comment: (NOTE) The Xpert SA Assay (FDA approved for NASAL specimens in patients 31 years of age and older), is one component of a comprehensive surveillance program. It is not intended to diagnose infection nor to guide or monitor treatment. Performed at [facility], [address], [place], [place] [address]      Radiology Studies: DG FEMUR MIN 2 VIEWS LEFT Result Date: 08/03/2024 EXAM: 2  VIEW(S) XRAY OF THE FEMUR 08/03/2024 06:41:00 PM COMPARISON: Intraoperative films from earlier in the same day. CLINICAL HISTORY: Fracture [id number]. FINDINGS: BONES AND JOINTS: Intramedullary nail fixation of proximal femoral fracture in place. Tricompartmental moderate to severe degenerative changes of knee. SOFT TISSUES: Vascular calcifications. IMPRESSION: 1. Intramedullary nail fixation of proximal femoral fracture in place. Electronically signed by: Oneil Devonshire MD 08/03/2024 07:49 PM EST RP Workstation: [id number] FEMUR MIN 2 VIEWS LEFT Result Date: 08/03/2024 CLINICAL DATA:  Elective surgery. EXAM: LEFT FEMUR 2 VIEWS COMPARISON:  Left hip radiograph dated 08/03/2024. FINDINGS: Intraoperative fluoroscopic spot images of the left femur. The total fluoroscopic time is 113.5 seconds with a cumulative air [place] of [id number] mGy. Status post ORIF of left femoral fracture. IMPRESSION: Intraoperative fluoroscopic spot images of the left femur. Electronically Signed   By: Vanetta Chou M.D.   On: 08/03/2024 16:09   DG C-Arm 1-60 Min-No Report Result Date: 08/03/2024 Fluoroscopy was utilized  by the requesting physician.  No radiographic interpretation.   DG C-Arm 1-60 Min-No Report Result Date: 08/03/2024 Fluoroscopy was utilized by the requesting physician.  No radiographic interpretation.   Chest Portable 1 View Result Date: 08/03/2024 EXAM: 1 VIEW(S) XRAY OF THE CHEST 08/03/2024 11:12:21 AM COMPARISON: 07/28/2024 CLINICAL HISTORY: [id number] Fall [id number] FINDINGS: LUNGS AND PLEURA: Right upper lung scarring. Calcified right upper lung nodule. No pleural effusion. No pneumothorax. HEART AND MEDIASTINUM: Aortic calcification. Calcified right paratracheal lymph nodes. BONES AND SOFT TISSUES: No acute osseous abnormality. IMPRESSION: 1. No acute process. Electronically signed by: Waddell Calk MD 08/03/2024 11:26 AM EST RP Workstation: [id number]   DG FEMUR MIN 2 VIEWS LEFT Result Date: 08/03/2024 EXAM: 2  VIEW(S) XRAY OF THE LEFT FEMUR 08/03/2024 11:11:26 AM COMPARISON: None available. CLINICAL HISTORY: Fracture [id number] FINDINGS: BONES AND JOINTS: Acute displaced proximal left femoral fracture with varus angulation and proximal displacement of the distal fracture fragment. Severe tricompartmental degenerative changes of the left knee. SOFT TISSUES: Extensive atherosclerotic vascular calcifications. IMPRESSION: 1. Acute displaced proximal left femoral intertrochanteric fracture with varus angulation and proximal displacement of the distal fracture fragment. Electronically signed by: Waddell Calk MD 08/03/2024 11:25 AM EST RP Workstation: [id number]   DG Hip Unilat W or Wo Pelvis 2-3 Views Left Result Date: 08/03/2024 EXAM: 2 OR MORE VIEW(S) XRAY OF THE RIGHT HIP 08/03/2024 09:18:00 AM COMPARISON: None available. CLINICAL HISTORY: Fall FINDINGS: BONES AND JOINTS: Comminuted displaced proximal femoral shaft fracture with apex lateral angulation. Cm of shortening with 3.5 cm of medial displacement of the distal femoral shaft. Moderate right hip osteoarthritis. SOFT TISSUES: Diffuse aortoiliac and peripheral vascular atherosclerosis. LUMBAR SPINE: Multilevel degenerative changes of the lumbar spine. IMPRESSION: 1. Comminuted displaced proximal femoral shaft fracture with apex lateral angulation, 3.5 cm of medial displacement of the distal femoral shaft, and 5 cm of foreshortening. Electronically signed by: Rogelia Myers MD 08/03/2024 09:30 AM EST RP Workstation: [id number]    Scheduled Meds:  apixaban   2.5 mg Oral BID   ezetimibe   10 mg Oral Daily   metoprolol  tartrate  25 mg Oral BID   senna-docusate  1 tablet Oral BID   sertraline   50 mg Oral Daily   simvastatin   40 mg Oral q1800   Continuous Infusions:   LOS: 1 day   Time spent: 38 minutes  Casimer Dare, MD  [facility]  08/04/2024, 4:08 PM    "

## 2024-08-04 NOTE — Progress Notes (Signed)
" ° ° ° °  Subjective: Patient reports pain as moderate.  He is confused this morning and in denial that he had surgery yesterday.  I tried to reorient him and provide him some reassurance.  Plan for mobilization with therapy today.  Yesterday's total administered Morphine  Milligram Equivalents: 110   Objective:   VITALS:   Vitals:   08/03/24 1807 08/03/24 2121 08/04/24 0438 08/04/24 0742  BP: (!) 148/68 (!) 145/89 128/75 133/79  Pulse: 91 96 76 89  Resp: 16  20 19   Temp:  97.8 F (36.6 C) 98.6 F (37 C) 98.2 F (36.8 C)  TempSrc:  Oral Oral   SpO2: 93% 95% 96% 97%    Sensation intact distally Intact pulses distally Dorsiflexion/Plantar flexion intact Incision: dressing C/D/I Compartment soft    Lab Results  Component Value Date   WBC 6.8 08/04/2024   HGB 10.9 (L) 08/04/2024   HCT 31.9 (L) 08/04/2024   MCV 92.7 08/04/2024   PLT 177 08/04/2024   BMET    Component Value Date/Time   NA 141 08/04/2024 0419   K 4.3 08/04/2024 0419   CL 106 08/04/2024 0419   CO2 26 08/04/2024 0419   GLUCOSE 118 (H) 08/04/2024 0419   BUN 27 (H) 08/04/2024 0419   CREATININE 1.08 08/04/2024 0419   CREATININE 1.27 (H) 06/29/2022 1056   CREATININE 1.07 06/28/2020 1542   CALCIUM 8.7 (L) 08/04/2024 0419   GFRNONAA >60 08/04/2024 0419   GFRNONAA 59 (L) 06/29/2022 1056      Xray: Left femur fracture well reduced with intramedullary nail intact hardware no adverse features  Assessment/Plan: 1 Day Post-Op   Principal Problem:   Closed left hip fracture (HCC) Active Problems:   Hyperlipidemia   Essential hypertension   Fall at home, initial encounter   History of DVT (deep vein thrombosis)   Parkinson's disease (HCC)   History of lung cancer   Depression   Prolonged QT interval  Status post left subtrochanteric femur fracture ORIF with intramedullary nail 08/03/2024  Post op recs: WB: WBAT LLE Abx: ancef  x23 hours post op Imaging: PACU xrays Dressing: keep intact until follow  up, change PRN if soiled or saturated. DVT prophylaxis: Resume Eliquis  postop 2.5 mg twice daily Follow up: 2 weeks after surgery for a wound check with Dr. Edna at [facility].  Address: [address], [place], [place] [address]  Office Phone: [phone]     TORIBIO DELENA EDNA 08/04/2024, 9:24 AM   Toribio Edna, MD  Contact information:   [phone] 7am-5pm epic message Dr. Edna, or call office for patient follow up: [phone] After hours and holidays please check [email or web address] for group call information for [facility]   "

## 2024-08-04 NOTE — Evaluation (Signed)
 Occupational Therapy Evaluation Patient Details Name: John Graham MRN: [id number] DOB: 26-Dec-1945 Today's Date: 08/04/2024   History of Present Illness   John Graham is a 78 yo male who presented from [facility] after a fall OOB. Found to have Left subtrochanteric femur fracture, he is s/p ORIF 12/21. PMH positive for recent admit 12/15 for falls, Parkinson's disease, HLD, HTN, arthritis, hearing loss, NCSC R lung, h/o L LE DVT.     Clinical Impressions Burnard was evaluated s/p the above admission list. He recently discharge to SNF, unsure level of function at SNF, pt reports RW use at baseline, 5 recent falls and some assist form daughter for ADLs as needed. Upon evaluation the pt was limited by LLE pain, impaired cognition, HOH, impaired balance, inability to shift weight through LLE and poor activity tolerance. Overall he needed max A +2 for bed mobility and min A +2 to stand with RW. He was unable to progress away from the bed. Due to the deficits listed below the pt also needs total A for LB ADLs and min A for UB ADls. Pt will benefit from continued acute OT services and skilled inpatient follow up therapy, <3 hours/day.     If plan is discharge home, recommend the following:   A lot of help with walking and/or transfers;A lot of help with bathing/dressing/bathroom;Assistance with cooking/housework;Assistance with feeding;Direct supervision/assist for medications management;Direct supervision/assist for financial management;Assist for transportation;Help with stairs or ramp for entrance;Supervision due to cognitive status     Functional Status Assessment   Patient has had a recent decline in their functional status and demonstrates the ability to make significant improvements in function in a reasonable and predictable amount of time.     Equipment Recommendations   None recommended by OT      Precautions/Restrictions   Precautions Precautions:  Fall Restrictions Weight Bearing Restrictions Per Provider Order: Yes LLE Weight Bearing Per Provider Order: Weight bearing as tolerated     Mobility Bed Mobility Overal bed mobility: Needs Assistance Bed Mobility: Supine to Sit, Sit to Supine     Supine to sit: Max assist, +2 for physical assistance, +2 for safety/equipment Sit to supine: Max assist, +2 for physical assistance, +2 for safety/equipment        Transfers Overall transfer level: Needs assistance Equipment used: Rolling walker (2 wheels) Transfers: Sit to/from Stand Sit to Stand: Min assist, +2 physical assistance, +2 safety/equipment           General transfer comment: unable to progress to stepping      Balance Overall balance assessment: Needs assistance Sitting-balance support: Feet supported Sitting balance-Leahy Scale: Fair     Standing balance support: Bilateral upper extremity supported, During functional activity Standing balance-Leahy Scale: Poor                             ADL either performed or assessed with clinical judgement   ADL Overall ADL's : Needs assistance/impaired Eating/Feeding: Independent   Grooming: Set up;Sitting   Upper Body Bathing: Contact guard assist;Sitting   Lower Body Bathing: Maximal assistance;+2 for safety/equipment;+2 for physical assistance;Sit to/from stand   Upper Body Dressing : Minimal assistance;Sitting   Lower Body Dressing: Maximal assistance;+2 for physical assistance;+2 for safety/equipment;Sit to/from stand   Toilet Transfer: Maximal assistance;+2 for physical assistance;+2 for safety/equipment;Stand-pivot;BSC/3in1   Toileting- Clothing Manipulation and Hygiene: Total assistance;+2 for physical assistance;+2 for safety/equipment       Functional mobility during  ADLs: Maximal assistance;+2 for physical assistance;+2 for safety/equipment General ADL Comments: limited by cog, weakness, LLE pain, baseline PD     Vision Baseline  Vision/History: 1 Wears glasses Vision Assessment?: No apparent visual deficits     Perception Perception: Not tested       Praxis Praxis: Not tested       Pertinent Vitals/Pain Pain Assessment Pain Assessment: Faces Faces Pain Scale: Hurts little more Pain Location: LLE Pain Descriptors / Indicators: Discomfort, Grimacing Pain Intervention(s): Limited activity within patient's tolerance, Monitored during session, RN gave pain meds during session     Extremity/Trunk Assessment Upper Extremity Assessment Upper Extremity Assessment: Generalized weakness   Lower Extremity Assessment Lower Extremity Assessment: Defer to PT evaluation   Cervical / Trunk Assessment Cervical / Trunk Assessment: Kyphotic   Communication Communication Communication: Impaired Factors Affecting Communication: Hearing impaired   Cognition Arousal: Alert Behavior During Therapy: WFL for tasks assessed/performed Cognition: Cognition impaired     Awareness: Online awareness impaired Memory impairment (select all impairments): Short-term memory, Working civil service fast streamer, Non-declarative long-term memory Attention impairment (select first level of impairment): Selective attention Executive functioning impairment (select all impairments): Organization, Sequencing, Reasoning, Problem solving OT - Cognition Comments: perseverating on unrelated topics, followed most simple 1 step commands, limited insight, does not remember having surgery but remembers being told he had surgery. oriented x4                 Following commands: Impaired Following commands impaired: Follows one step commands with increased time     Cueing  General Comments   Cueing Techniques: Verbal cues  VSS - tele off on arrival, put leads back on pt           Home Living Family/patient expects to be discharged to:: Private residence Living Arrangements: Children;Other relatives Available Help at Discharge: Family Type of Home:  House       Home Layout: One level                   Additional Comments: recent d/c to SNF - prior to December he was home with dtr      Prior Functioning/Environment Prior Level of Function : Needs assist             Mobility Comments: RW and repots at least 5 recent falls - unsure of mobility at SNF ADLs Comments: pt states he sponge bathes at baseline, dresses and toilets without assist. Unsure of functional level at SNF    OT Problem List: Decreased strength;Decreased range of motion;Decreased activity tolerance;Impaired balance (sitting and/or standing);Decreased safety awareness;Decreased knowledge of use of DME or AE;Decreased knowledge of precautions   OT Treatment/Interventions: Self-care/ADL training;Therapeutic exercise;DME and/or AE instruction;Therapeutic activities;Patient/family education;Balance training      OT Goals(Current goals can be found in the care plan section)   Acute Rehab OT Goals Patient Stated Goal: home OT Goal Formulation: With patient Time For Goal Achievement: 08/18/24 Potential to Achieve Goals: Good ADL Goals Pt Will Perform Lower Body Dressing: with min assist;sit to/from stand Pt Will Transfer to Toilet: with min assist;ambulating Pt Will Perform Toileting - Clothing Manipulation and hygiene: with min assist;sitting/lateral leans Additional ADL Goal #1: pt will complete bed mobility with min A as a precursor to ADLs   OT Frequency:  Min 2X/week    Co-evaluation PT/OT/SLP Co-Evaluation/Treatment: Yes Reason for Co-Treatment: Complexity of the patient's impairments (multi-system involvement);To address functional/ADL transfers;For patient/therapist safety   OT goals addressed during session: ADL's and self-care  AM-PAC OT 6 Clicks Daily Activity     Outcome Measure Help from another person eating meals?: None Help from another person taking care of personal grooming?: A Little Help from another person toileting,  which includes using toliet, bedpan, or urinal?: A Lot Help from another person bathing (including washing, rinsing, drying)?: A Lot Help from another person to put on and taking off regular upper body clothing?: A Little Help from another person to put on and taking off regular lower body clothing?: A Lot 6 Click Score: 16   End of Session Equipment Utilized During Treatment: Rolling walker (2 wheels) Nurse Communication: Mobility status  Activity Tolerance: Patient tolerated treatment well Patient left: in bed;with call bell/phone within reach;with bed alarm set  OT Visit Diagnosis: Unsteadiness on feet (R26.81);Other abnormalities of gait and mobility (R26.89);Repeated falls (R29.6);History of falling (Z91.81);Muscle weakness (generalized) (M62.81)                Time: 8648-8584 OT Time Calculation (min): 24 min Charges:  OT General Charges $OT Visit: 1 Visit OT Evaluation $OT Eval Moderate Complexity: 1 Mod  Lucie Kendall, OTR/L Acute Rehabilitation Services Office [phone] Secure Chat Communication Preferred   Lucie JONETTA Kendall 08/04/2024, 3:36 PM

## 2024-08-05 ENCOUNTER — Other Ambulatory Visit: Payer: Self-pay

## 2024-08-05 MED ORDER — ONDANSETRON HCL 4 MG PO TABS: 4.0000 mg | ORAL_TABLET | Freq: Three times a day (TID) | ORAL | 0 refills | Status: AC | PRN

## 2024-08-05 MED ORDER — HYDROCODONE-ACETAMINOPHEN 5-325 MG PO TABS: 1.0000 | ORAL_TABLET | Freq: Four times a day (QID) | ORAL | 0 refills | Status: AC | PRN

## 2024-08-05 MED ORDER — APIXABAN 2.5 MG PO TABS: 2.5000 mg | ORAL_TABLET | Freq: Two times a day (BID) | ORAL | 0 refills | Status: AC

## 2024-08-05 NOTE — Progress Notes (Signed)
 " PROGRESS NOTE    John Graham  FMW:[id number] DOB: June 20, 1946 DOA: 08/03/2024 PCP: Micheal Wolm ORN, MD  Subjective: Patient reports having pain in his left hip. He had to be reminded about his surgery a couple of times.     Hospital Course: 78 y.o. male with medical history significant of hypertension, hyperlipidemia,  non-small cell lung cancer diagnosed in 2023 s/p completion of SBRT, frequent falls, Parkinson dz, left leg DVT previously on Eliquis  who presents after having a fall with left hip pain. Xray showed a left commuted displaced proximal femoral shaft fracture, and ortho was consulted. He underwent ORIF on 12/21     Assessment and Plan:  Left hip fracture secondary to fall Patient reports having frequent falls, and reportedly since slid out of bed today injuring his left leg.  X-rays revealed a left commuted displaced proximal femoral shaft fracture. Likely related to history of Parkinson's. Ortho was consulted - s/p ORIF on 08/03/24 - pain control with PRN norco and morphine   - PT/OT eval    Parkinson's disease Patient does appear to have issues with his memory unsure of notable events that are in his chart.  Seen by Dr. Skeet of neurology on 12/18 for which increasing tremor was noted and recommended to be started on carbidopa  levodopa . - Delirium precautions. - Unclear if patient has formally started on carbidopa  levodopa  at this time - follow up with neurology as outpatient    History of DVT Patient has a history of a left leg DVT in 07/2023 for which he was on anticoagulation, but records appear to show that anticoagulation was discontinued on 12/17 and he was started on aspirin. - for now he is on eliquis  2.5 mg BID for DVT Ppx post op, will f/u with ortho with recs for discharge    Essential hypertension - controlled  - Continue metoprolol  25 mg twice daily   History of lung cancer Patient status post SBRT treatment back in 2023.  Management was reported  the patient declined adjunctive therapy at that time.   Depression - Continue Zoloft    Hyperlipidemia - Continue simvastatin    Prolonged QT interval Improved.  QTc initially 514, but repeat EKG revealed QTc improved to 471. - Correct any electrolyte abnormalities - Continue to monitor    DVT prophylaxis: apixaban  (ELIQUIS ) tablet 2.5 mg Start: 08/04/24 1000 apixaban  (ELIQUIS ) tablet 2.5 mg    Code Status: Full Code Disposition Plan: SNF Reason for continuing need for hospitalization: med ready   Objective: Vitals:   08/05/24 0345 08/05/24 0724 08/05/24 1409 08/05/24 1500  BP: 113/69 115/69 111/72   Pulse: 64 76 70   Resp:  17 16   Temp: 98.3 F (36.8 C) (!) 97.4 F (36.3 C) (!) 97.5 F (36.4 C)   TempSrc: Oral     SpO2: 93% 97% 99%   Weight:    94.3 kg  Height:    5' 11 (1.803 m)    Intake/Output Summary (Last 24 hours) at 08/05/2024 1544 Last data filed at 08/05/2024 0953 Gross per 24 hour  Intake --  Output 650 ml  Net -650 ml   Filed Weights   08/05/24 1500  Weight: 94.3 kg    Examination:  Physical Exam Vitals and nursing note reviewed.  Constitutional:      General: He is not in acute distress. Cardiovascular:     Rate and Rhythm: Normal rate.  Pulmonary:     Effort: No respiratory distress.     Breath sounds: No  wheezing.  Abdominal:     General: There is no distension.     Tenderness: There is no abdominal tenderness.  Musculoskeletal:     Right lower leg: No edema.     Left lower leg: No edema.     Comments: Left hip in dressing, mild bruising around the dressing     Data Reviewed: I have personally reviewed following labs and imaging studies  CBC: Recent Labs  Lab 08/03/24 0816 08/04/24 0419  WBC 7.0 6.8  HGB 12.5* 10.9*  HCT 38.6* 31.9*  MCV 96.3 92.7  PLT 183 177   Basic Metabolic Panel: Recent Labs  Lab 08/03/24 0816 08/04/24 0419  NA 140 141  K 4.5 4.3  CL 106 106  CO2 24 26  GLUCOSE 114* 118*  BUN 21 27*   CREATININE 0.90 1.08  CALCIUM 8.8* 8.7*   GFR: Estimated Creatinine Clearance: 66.1 mL/min (by C-G formula based on SCr of 1.08 mg/dL). Liver Function Tests: No results for input(s): AST, ALT, ALKPHOS, BILITOT, PROT, ALBUMIN in the last 168 hours. No results for input(s): LIPASE, AMYLASE in the last 168 hours. No results for input(s): AMMONIA in the last 168 hours. Coagulation Profile: Recent Labs  Lab 08/03/24 1215  INR 1.1   Cardiac Enzymes: No results for input(s): CKTOTAL, CKMB, CKMBINDEX, TROPONINI in the last 168 hours. ProBNP, BNP (last 5 results) No results for input(s): PROBNP, BNP in the last 8760 hours. HbA1C: No results for input(s): HGBA1C in the last 72 hours. CBG: No results for input(s): GLUCAP in the last 168 hours. Lipid Profile: No results for input(s): CHOL, HDL, LDLCALC, TRIG, CHOLHDL, LDLDIRECT in the last 72 hours. Thyroid  Function Tests: No results for input(s): TSH, T4TOTAL, FREET4, T3FREE, THYROIDAB in the last 72 hours. Anemia Panel: No results for input(s): VITAMINB12, FOLATE, FERRITIN, TIBC, IRON, RETICCTPCT in the last 72 hours. Sepsis Labs: No results for input(s): PROCALCITON, LATICACIDVEN in the last 168 hours.  Recent Results (from the past 240 hours)  Surgical pcr screen     Status: None   Collection Time: 08/03/24  2:01 PM   Specimen: Nasal Mucosa; Nasal Swab  Result Value Ref Range Status   MRSA, PCR NEGATIVE NEGATIVE Final   Staphylococcus aureus NEGATIVE NEGATIVE Final    Comment: (NOTE) The Xpert SA Assay (FDA approved for NASAL specimens in patients 41 years of age and older), is one component of a comprehensive surveillance program. It is not intended to diagnose infection nor to guide or monitor treatment. Performed at [facility], [address], [place], [place] [address]      Radiology Studies: DG FEMUR MIN 2 VIEWS LEFT Result Date:  08/03/2024 EXAM: 2 VIEW(S) XRAY OF THE FEMUR 08/03/2024 06:41:00 PM COMPARISON: Intraoperative films from earlier in the same day. CLINICAL HISTORY: Fracture [id number]. FINDINGS: BONES AND JOINTS: Intramedullary nail fixation of proximal femoral fracture in place. Tricompartmental moderate to severe degenerative changes of knee. SOFT TISSUES: Vascular calcifications. IMPRESSION: 1. Intramedullary nail fixation of proximal femoral fracture in place. Electronically signed by: Oneil Devonshire MD 08/03/2024 07:49 PM EST RP Workstation: MYRTICE BARE FEMUR MIN 2 VIEWS LEFT Result Date: 08/03/2024 CLINICAL DATA:  Elective surgery. EXAM: LEFT FEMUR 2 VIEWS COMPARISON:  Left hip radiograph dated 08/03/2024. FINDINGS: Intraoperative fluoroscopic spot images of the left femur. The total fluoroscopic time is 113.5 seconds with a [place] 85893 mGy. Status post ORIF of left femoral fracture. IMPRESSION: Intraoperative fluoroscopic spot images of the left femur. Electronically  Signed   By: Arash  Radparvar M.D.   On: 08/03/2024 16:09   DG C-Arm 1-60 Min-No Report Result Date: 08/03/2024 Fluoroscopy was utilized by the requesting physician.  No radiographic interpretation.   DG C-Arm 1-60 Min-No Report Result Date: 08/03/2024 Fluoroscopy was utilized by the requesting physician.  No radiographic interpretation.    Scheduled Meds:  apixaban   2.5 mg Oral BID   ezetimibe   10 mg Oral Daily   metoprolol  tartrate  25 mg Oral BID   senna-docusate  1 tablet Oral BID   sertraline   50 mg Oral Daily   simvastatin   40 mg Oral q1800   Continuous Infusions:   LOS: 2 days   Time spent: 38 minutes  Casimer Dare, MD  [facility]  08/05/2024, 3:44 PM   "

## 2024-08-05 NOTE — TOC Initial Note (Addendum)
 Transition of Care [facility]) - Initial/Assessment Note    Patient Details  Name: John Graham MRN: [id number] Date of Birth: 1945/10/06  Transition [facility]) CM/SW Contact:    Luise JAYSON Pan, LCSWA Phone Number: 08/05/2024, 2:26 PM  Clinical Narrative:   CSW left VM for patients daughter [name] to discuss SNF. CSW followed up with patients Grandson to discuss SNF. Grandson stated that he believes that plan will be for SNF but his mother is the person to discuss SNF placement with.   3:30 PM PT messaged CSW to inform that daughter did a bed hold at [facility]. CSW to follow up regarding medical readiness and to submit for insurance auth.  CSW will continue to follow.              Expected Discharge Plan: Skilled Nursing Facility Barriers to Discharge: Continued Medical Work up, SNF Pending bed offer, Insurance Authorization   Patient Goals and CMS Choice Patient states their goals for this hospitalization and ongoing recovery are:: To go to SNF CMS Medicare.gov Compare Post Acute Care list provided to:: Patient Choice offered to / list presented to : Patient, Adult Children [place] ownership interest in [facility].provided to:: Adult Children    Expected Discharge Plan and Services In-house Referral: Clinical Social Work   Post Acute Care Choice: Skilled Nursing Facility Living arrangements for the past 2 months: Single Family Home                                      Prior Living Arrangements/Services Living arrangements for the past 2 months: Single Family Home Lives with:: Self Patient language and need for interpreter reviewed:: Yes Do you feel safe going back to the place where you live?: Yes      Need for Family Participation in Patient Care: Yes (Comment) Care giver support system in place?: No (comment)   Criminal Activity/Legal Involvement Pertinent to Current Situation/Hospitalization: No - Comment as needed  Activities of Daily  Living      Permission Sought/Granted Permission sought to share information with : Facility Medical Sales Representative, Family Supports Permission granted to share information with : No (Patient contact info in chart)  Share Information with NAME: [facility]  Daughter, Emergency Contact  [phone]  Permission granted to share info w AGENCY: SNFs        Emotional Assessment Appearance:: Appears stated age Attitude/Demeanor/Rapport: Unable to Assess Affect (typically observed): Unable to Assess Orientation: : Oriented to Situation, Oriented to  Time, Oriented to Place, Oriented to Self Alcohol / Substance Use: Not Applicable Psych Involvement: No (comment)  Admission diagnosis:  Closed left hip fracture (HCC) [S72.002A] Closed fracture of left hip, initial encounter [facility]) [S72.002A] Patient Active Problem List   Diagnosis Date Noted   Closed left hip fracture (HCC) 08/03/2024   Fall at home, initial encounter 08/03/2024   History of DVT (deep vein thrombosis) 08/03/2024   Parkinson's disease (HCC) 08/03/2024   History of lung cancer 08/03/2024   Depression 08/03/2024   Prolonged QT interval 08/03/2024   Acute deep vein thrombosis (DVT) of femoral vein of left lower extremity (HCC) 08/03/2023   Non-small cell cancer of right lung (HCC) 02/17/2022   Lung nodule 02/07/2022   Bilateral hearing loss 12/27/2021   Localized primary osteoarthritis of carpometacarpal joint of left thumb 01/25/2017   Psoriasiform eruption 02/03/2014   Anxiety state 07/29/2013   Disorder of skin  or subcutaneous tissue 12/30/2009   Bilateral primary osteoarthritis of knee 08/23/2009   Hyperlipidemia 03/22/2009   Essential hypertension 03/22/2009   History of colonic polyps 03/22/2009   PCP:  Micheal Wolm ORN, MD Pharmacy:   [facility] [phone] - [place], [place] - [address] [place] [address] Phone: [phone] Fax:  [phone]  [facility] [address] [place] [address] Phone: [phone] Fax: [phone]  [facility] - [place], [place] - [address] [place] [address] [place] [address] [place] [address] Phone: [phone] Fax: [phone]     Social Drivers of Health (SDOH) Social History: SDOH Screenings   Food Insecurity: Patient Unable To Answer (08/03/2024)  Housing: Patient Unable To Answer (08/03/2024)  Transportation Needs: Patient Unable To Answer (08/03/2024)  Utilities: Patient Unable To Answer (08/03/2024)  Alcohol Screen: Low Risk (03/02/2023)  Depression (PHQ2-9): Low Risk (08/02/2023)  Financial Resource Strain: Low Risk (03/02/2023)  Physical Activity: Inactive (03/02/2023)  Social Connections: Patient Unable To Answer (08/03/2024)  Stress: No Stress Concern Present (03/02/2023)  Tobacco Use: High Risk (08/03/2024)  Health Literacy: Adequate Health Literacy (03/02/2023)   SDOH Interventions:     Readmission Risk Interventions     No data to display

## 2024-08-05 NOTE — NC FL2 (Signed)
 " [place]  MEDICAID FL2 LEVEL OF CARE FORM     IDENTIFICATION  Patient Name: John Graham Birthdate: 1945/12/28 Sex: male Admission Date (Current Location): 08/03/2024  [facility] and [place] Number:  Producer, Television/film/video and Address:  [facility] [place], [address] [place], [place] [address]      Provider Number: [id number]  Attending Physician Name and Address:  Caleen Colander, MD  Relative Name and Phone Number:  [facility] Daughter, [phone] [facility])    Current Level of Care: Hospital Recommended Level of Care: Skilled Nursing Facility Prior Approval Number:    Date Approved/Denied:   PASRR Number: [id number]  Discharge Plan: SNF    Current Diagnoses: Patient Active Problem List   Diagnosis Date Noted   Closed left hip fracture (HCC) 08/03/2024   Fall at home, initial encounter 08/03/2024   History of DVT (deep vein thrombosis) 08/03/2024   Parkinson's disease (HCC) 08/03/2024   History of lung cancer 08/03/2024   Depression 08/03/2024   Prolonged QT interval 08/03/2024   Acute deep vein thrombosis (DVT) of femoral vein of left lower extremity (HCC) 08/03/2023   Non-small cell cancer of right lung (HCC) 02/17/2022   Lung nodule 02/07/2022   Bilateral hearing loss 12/27/2021   Localized primary osteoarthritis of carpometacarpal joint of left thumb 01/25/2017   Psoriasiform eruption 02/03/2014   Anxiety state 07/29/2013   Disorder of skin or subcutaneous tissue 12/30/2009   Bilateral primary osteoarthritis of knee 08/23/2009   Hyperlipidemia 03/22/2009   Essential hypertension 03/22/2009   History of colonic polyps 03/22/2009    Orientation RESPIRATION BLADDER Height & Weight     Self, Time, Situation, Place  Normal Continent, External catheter Weight:   Height:     BEHAVIORAL SYMPTOMS/MOOD NEUROLOGICAL BOWEL NUTRITION STATUS      Continent Diet (Please see discharge summary)  AMBULATORY STATUS COMMUNICATION OF NEEDS Skin    Extensive Assist Verbally Other (Comment) (Surgical Closed Surgical Incision Hip Left; Traumatic Elbow Right)                       Personal Care Assistance Level of Assistance  Bathing, Feeding, Dressing Bathing Assistance: Maximum assistance Feeding assistance: Limited assistance Dressing Assistance: Maximum assistance     Functional Limitations Info  Sight, Speech, Hearing Sight Info: Impaired Hearing Info: Adequate Speech Info: Adequate    SPECIAL CARE FACTORS FREQUENCY  PT (By licensed PT), OT (By licensed OT)     PT Frequency: 5x/wk OT Frequency: 5x/wk            Contractures Contractures Info: Not present    Additional Factors Info  Code Status, Allergies, Psychotropic Code Status Info: Full Allergies Info: NKA Psychotropic Info: Zoloft          Current Medications (08/05/2024):  This is the current hospital active medication list Current Facility-Administered Medications  Medication Dose Route Frequency Provider Last Rate Last Admin   apixaban  (ELIQUIS ) tablet 2.5 mg  2.5 mg Oral BID Edna Toribio LABOR, MD   2.5 mg at 08/05/24 9046   ezetimibe  (ZETIA ) tablet 10 mg  10 mg Oral Daily Edna Toribio LABOR, MD   10 mg at 08/05/24 9046   HYDROcodone -acetaminophen  (NORCO/VICODIN) 5-325 MG per tablet 1-2 tablet  1-2 tablet Oral Q6H PRN Edna Toribio LABOR, MD   1 tablet at 08/05/24 1000   metoprolol  tartrate (LOPRESSOR ) tablet 25 mg  25 mg Oral BID Edna Toribio LABOR, MD   25 mg at 08/05/24 [phone]  morphine  (PF) 2 MG/ML injection 0.5 mg  0.5 mg Intravenous Q2H PRN Edna Toribio LABOR, MD   0.5 mg at 08/04/24 1359   Oral care mouth rinse  15 mL Mouth Rinse PRN Claudene Maximino LABOR, MD       senna-docusate (Senokot-S) tablet 1 tablet  1 tablet Oral BID Edna Toribio LABOR, MD   1 tablet at 08/05/24 9046   sertraline  (ZOLOFT ) tablet 50 mg  50 mg Oral Daily Edna Toribio LABOR, MD   50 mg at 08/05/24 9046   simvastatin  (ZOCOR ) tablet 40 mg  40 mg Oral q1800  Marchwiany, Daniel A, MD   40 mg at 08/04/24 1659     Discharge Medications: Please see discharge summary for a list of discharge medications.  Relevant Imaging Results:  Relevant Lab Results:   Additional Information SSN [id number]  Luise JAYSON Pan, LCSWA     "

## 2024-08-05 NOTE — Progress Notes (Signed)
" ° ° °  2 Days Post-Op Procedures (LRB): FIXATION, FRACTURE, INTERTROCHANTERIC, WITH INTRAMEDULLARY ROD (Left)  Subjective: Patient reports pain as moderate when moving, though mild at rest.  He appears more coherent today, knows he had surgery yesterday.  Denies numbness or tingling to leg.  At PT yesterday, was able to participate some, going from sit to stand.  No overnight events.  No other concerns at this time.  Yesterday's total administered Morphine  Milligram Equivalents: 23   Objective:   VITALS:   Vitals:   08/04/24 0438 08/04/24 0742 08/04/24 2011 08/05/24 0345  BP: 128/75 133/79 117/80 113/69  Pulse: 76 89 88 64  Resp: 20 19 20    Temp: 98.6 F (37 C) 98.2 F (36.8 C) 99 F (37.2 C) 98.3 F (36.8 C)  TempSrc: Oral   Oral  SpO2: 96% 97% 96% 93%    Resting in bed in NAD Sensation intact distally Intact pulses distally Dorsiflexion/Plantar flexion intact Incision: dressing C/D/I Compartment soft Wiggles toes appropriately    Lab Results  Component Value Date   WBC 6.8 08/04/2024   HGB 10.9 (L) 08/04/2024   HCT 31.9 (L) 08/04/2024   MCV 92.7 08/04/2024   PLT 177 08/04/2024   BMET    Component Value Date/Time   NA 141 08/04/2024 0419   K 4.3 08/04/2024 0419   CL 106 08/04/2024 0419   CO2 26 08/04/2024 0419   GLUCOSE 118 (H) 08/04/2024 0419   BUN 27 (H) 08/04/2024 0419   CREATININE 1.08 08/04/2024 0419   CREATININE 1.27 (H) 06/29/2022 1056   CREATININE 1.07 06/28/2020 1542   CALCIUM 8.7 (L) 08/04/2024 0419   GFRNONAA >60 08/04/2024 0419   GFRNONAA 59 (L) 06/29/2022 1056      Xray: Left femur fracture well reduced with intramedullary nail intact hardware no adverse features  Assessment/Plan: 2 Days Post-Op   Principal Problem:   Closed left hip fracture (HCC) Active Problems:   Hyperlipidemia   Essential hypertension   Fall at home, initial encounter   History of DVT (deep vein thrombosis)   Parkinson's disease (HCC)   History of lung  cancer   Depression   Prolonged QT interval  Status post left subtrochanteric femur fracture ORIF with intramedullary nail 08/03/2024  Post op recs: WB: WBAT LLE Abx: ancef  x23 hours post op Imaging: PACU xrays Dressing: keep intact until follow up, change PRN if soiled or saturated. DVT prophylaxis: Resume Eliquis  postop 2.5 mg twice daily Follow up: 2 weeks after surgery for a wound check with Dr. Edna at [facility].  Address: [address], [place], [place] [address]  Office Phone: [phone]     Bernarda CHRISTELLA Mclean 08/05/2024, 7:01 AM   Contact information:   Weekdays 7am-5pm epic message Dr. Edna, or call office for patient follow up: [phone] After hours and holidays please check [email or web address] for group call information for [facility]   "

## 2024-08-05 NOTE — Plan of Care (Signed)
  Problem: Pain Managment: Goal: General experience of comfort will improve and/or be controlled Outcome: Progressing   Problem: Safety: Goal: Ability to remain free from injury will improve Outcome: Progressing

## 2024-08-05 NOTE — Plan of Care (Signed)

## 2024-08-05 NOTE — Progress Notes (Signed)
 Physical Therapy Treatment Patient Details Name: John Graham MRN: [id number] DOB: [birth year] Today's Date: 08/05/2024   History of Present Illness John Graham is a 78 yo male who presented from [facility] after a fall OOB. Found to have Left subtrochanteric femur fracture, he is s/p ORIF 12/21. PMH positive for recent admit 12/15 for falls, Parkinson's disease, HLD, HTN, arthritis, hearing loss, NCSC R lung, h/o L LE DVT.    PT Comments  Pt progressing slowly towards his physical therapy goals; premedicated prior to session, but pain remains a significant limitation. Pt able to participate in AROM/AAROM bed level exercises. Requiring +2 assist for transitioning to edge of bed. Attempted to utilize [employer] (financial trader) for pre transfer training, however, pt unable to sequence how to use. Switched to RW and pt able to stand with modA + 2. He is unable to weight shift to take any steps quite yet. Performed two standing trials before pt requesting to lie back down. Patient will benefit from continued inpatient follow up therapy, <3 hours/day.    If plan is discharge home, recommend the following: Two people to help with bathing/dressing/bathroom;Two people to help with walking and/or transfers   Can travel by private vehicle     No  Equipment Recommendations  Other (comment) (defer)    Recommendations for Other Services       Precautions / Restrictions Precautions Precautions: Fall Restrictions Weight Bearing Restrictions Per Provider Order: Yes LLE Weight Bearing Per Provider Order: Weight bearing as tolerated     Mobility  Bed Mobility Overal bed mobility: Needs Assistance Bed Mobility: Supine to Sit, Sit to Supine     Supine to sit: Max assist, +2 for physical assistance, +2 for safety/equipment Sit to supine: Max assist, +2 for physical assistance, +2 for safety/equipment   General bed mobility comments: Assist for LLE management, cues for use of rail, use of bed pad  to pivot hips    Transfers Overall transfer level: Needs assistance Equipment used: Rolling walker (2 wheels), Ambulation equipment used Transfers: Sit to/from Stand Sit to Stand: +2 physical assistance, +2 safety/equipment, Mod assist           General transfer comment: Attempted to utilize [place], however, pt unable to sequence how to stand up. Switched to RW, with heavy modA + 2 to power up to stand with pt pulling up walker. Crouched posture    Ambulation/Gait                   Stairs             Wheelchair Mobility     Tilt Bed    Modified Rankin (Stroke Patients Only)       Balance Overall balance assessment: Needs assistance Sitting-balance support: Feet supported Sitting balance-Leahy Scale: Fair     Standing balance support: Bilateral upper extremity supported, During functional activity Standing balance-Leahy Scale: Poor                              Communication Communication Communication: Impaired Factors Affecting Communication: Hearing impaired  Cognition Arousal: Alert Behavior During Therapy: WFL for tasks assessed/performed   PT - Cognitive impairments: No family/caregiver present to determine baseline, Difficult to assess, Awareness, Memory, Attention, Problem solving Difficult to assess due to: Hard of hearing/deaf                     PT - Cognition Comments: Recalls  having surgery today Following commands: Impaired Following commands impaired: Follows one step commands with increased time    Cueing Cueing Techniques: Verbal cues  Exercises      General Comments        Pertinent Vitals/Pain Pain Assessment Pain Assessment: Faces Faces Pain Scale: Hurts even more Pain Location: LLE Pain Descriptors / Indicators: Discomfort, Grimacing Pain Intervention(s): Limited activity within patient's tolerance, Monitored during session, Premedicated before session, Repositioned    Home Living                           Prior Function            PT Goals (current goals can now be found in the care plan section) Acute Rehab PT Goals Patient Stated Goal: pt daughter would like him to transfer back to [facility] SNF PT Goal Formulation: With patient/family Time For Goal Achievement: 08/18/24 Potential to Achieve Goals: Fair Progress towards PT goals: Progressing toward goals    Frequency    Min 2X/week      PT Plan      Co-evaluation              AM-PAC PT 6 Clicks Mobility   Outcome Measure  Help needed turning from your back to your side while in a flat bed without using bedrails?: A Lot Help needed moving from lying on your back to sitting on the side of a flat bed without using bedrails?: A Lot Help needed moving to and from a bed to a chair (including a wheelchair)?: Total Help needed standing up from a chair using your arms (e.g., wheelchair or bedside chair)?: A Lot Help needed to walk in hospital room?: Total Help needed climbing 3-5 steps with a railing? : Total 6 Click Score: 9    End of Session Equipment Utilized During Treatment: Gait belt Activity Tolerance: Patient tolerated treatment well Patient left: in bed;with call bell/phone within reach;with bed alarm set Nurse Communication: Mobility status PT Visit Diagnosis: Muscle weakness (generalized) (M62.81);History of falling (Z91.81);Other abnormalities of gait and mobility (R26.89);Pain Pain - Right/Left: Left Pain - part of body: Hip     Time: 1151-1220 PT Time Calculation (min) (ACUTE ONLY): 29 min  Charges:    $Therapeutic Activity: 23-37 mins PT General Charges $$ ACUTE PT VISIT: 1 Visit                     Aleck Daring, PT, DPT Acute Rehabilitation Services Office [phone]    Aleck ONEIDA Daring 08/05/2024, 2:04 PM

## 2024-08-06 DIAGNOSIS — S72002A Fracture of unspecified part of neck of left femur, initial encounter for closed fracture: Secondary | ICD-10-CM | POA: Diagnosis not present

## 2024-08-06 MED ORDER — SENNOSIDES-DOCUSATE SODIUM 8.6-50 MG PO TABS: 1.0000 | ORAL_TABLET | Freq: Two times a day (BID) | ORAL | Status: AC

## 2024-08-06 NOTE — Progress Notes (Signed)
 Physical Therapy Treatment Patient Details Name: John Graham MRN: [id number] DOB: 02-17-1946 Today's Date: 08/06/2024   History of Present Illness JUANANTONIO Graham is a 78 yo male who presented from [facility] after a fall OOB. Found to have Left subtrochanteric femur fracture, he is s/p ORIF 12/21. PMH positive for recent admit 12/15 for falls, Parkinson's disease, HLD, HTN, arthritis, hearing loss, NCSC R lung, h/o L LE DVT.    PT Comments  Pt  progressing incrementally towards his physical therapy goals. Premedicated prior to session. Pt requiring modA + 2 for bed mobility and stand pivot transfers using RW over to chair. Patient will benefit from continued inpatient follow up therapy, <3 hours/day to address strengthening, ROM, functional mobility.     If plan is discharge home, recommend the following: Two people to help with bathing/dressing/bathroom;Two people to help with walking and/or transfers   Can travel by private vehicle     No  Equipment Recommendations  Other (comment) (defer)    Recommendations for Other Services       Precautions / Restrictions Precautions Precautions: Fall Restrictions Weight Bearing Restrictions Per Provider Order: Yes LLE Weight Bearing Per Provider Order: Weight bearing as tolerated     Mobility  Bed Mobility Overal bed mobility: Needs Assistance Bed Mobility: Supine to Sit     Supine to sit: +2 for physical assistance, +2 for safety/equipment, Mod assist     General bed mobility comments: Assist for LLE management, pt with use of bed rail but tends to have R lateral lean, increased time/effort. Use of bed pad to pivot L hip out to edge    Transfers Overall transfer level: Needs assistance Equipment used: Rolling walker (2 wheels), Ambulation equipment used Transfers: Sit to/from Stand, Bed to chair/wheelchair/BSC Sit to Stand: +2 physical assistance, +2 safety/equipment, Mod assist Stand pivot transfers: Mod assist, +2  physical assistance         General transfer comment: Heavy modA to power up to stand, pt pulling up on RW, crouched posture. Pivot to chair towards R    Ambulation/Gait                   Stairs             Wheelchair Mobility     Tilt Bed    Modified Rankin (Stroke Patients Only)       Balance Overall balance assessment: Needs assistance Sitting-balance support: Feet supported Sitting balance-Leahy Scale: Fair     Standing balance support: Bilateral upper extremity supported, During functional activity Standing balance-Leahy Scale: Poor                              Communication Communication Communication: Impaired Factors Affecting Communication: Hearing impaired  Cognition Arousal: Alert Behavior During Therapy: WFL for tasks assessed/performed   PT - Cognitive impairments: No family/caregiver present to determine baseline, Difficult to assess, Awareness, Memory, Attention, Problem solving Difficult to assess due to: Hard of hearing/deaf                     PT - Cognition Comments: Recalls having surgery today Following commands: Impaired Following commands impaired: Follows one step commands with increased time    Cueing Cueing Techniques: Verbal cues  Exercises      General Comments        Pertinent Vitals/Pain Pain Assessment Pain Assessment: Faces Faces Pain Scale: Hurts even more Pain Location: LLE  Pain Descriptors / Indicators: Discomfort, Grimacing Pain Intervention(s): Limited activity within patient's tolerance, Monitored during session, Premedicated before session    Home Living                          Prior Function            PT Goals (current goals can now be found in the care plan section) Acute Rehab PT Goals Patient Stated Goal: pt daughter would like him to transfer back to [facility] SNF PT Goal Formulation: With patient/family Time For Goal Achievement: 08/18/24 Potential to  Achieve Goals: Fair Progress towards PT goals: Progressing toward goals    Frequency    Min 2X/week      PT Plan      Co-evaluation              AM-PAC PT 6 Clicks Mobility   Outcome Measure  Help needed turning from your back to your side while in a flat bed without using bedrails?: A Lot Help needed moving from lying on your back to sitting on the side of a flat bed without using bedrails?: A Lot Help needed moving to and from a bed to a chair (including a wheelchair)?: Total Help needed standing up from a chair using your arms (e.g., wheelchair or bedside chair)?: A Lot Help needed to walk in hospital room?: Total Help needed climbing 3-5 steps with a railing? : Total 6 Click Score: 9    End of Session Equipment Utilized During Treatment: Gait belt Activity Tolerance: Patient tolerated treatment well Patient left: with call bell/phone within reach;in chair;with chair alarm set Nurse Communication: Mobility status;Need for lift equipment PT Visit Diagnosis: Muscle weakness (generalized) (M62.81);History of falling (Z91.81);Other abnormalities of gait and mobility (R26.89);Pain Pain - Right/Left: Left Pain - part of body: Hip     Time: 1330-1350 PT Time Calculation (min) (ACUTE ONLY): 20 min  Charges:    $Therapeutic Activity: 8-22 mins PT General Charges $$ ACUTE PT VISIT: 1 Visit                     Aleck Daring, PT, DPT Acute Rehabilitation Services Office [phone]    Aleck ONEIDA Daring 08/06/2024, 3:24 PM

## 2024-08-06 NOTE — Hospital Course (Addendum)
 John Graham is a 78 y.o. male with PMH of f hypertension, hyperlipidemia, non-small cell lung cancer diagnosed in 2023 s/p completion of SBRT, frequent falls, Parkinson dz, left leg DVT previously on Eliquis  who presents after having a fall and Xray showed a left commuted displaced proximal femoral shaft fracture, and ortho was consulted. He underwent ORIF on 12/21. Postoperatively stable PT OT following and recommending skilled nursing facility, TOC following for disposition Waiting for SNF, at this time medically stable  Subjective: Seen and examined Alert awake resting comfortably Overnight on room air, afebrile, VSS, Labs 12/22 -stable BMP CBC with mild anemia 10.9 .  Last BM 12/23  Assessment and plan:  Left commuted displaced proximal femoral shaft fracture 2/2 fall- slid out of bed History of frequent falls History of Parkinson disease likely contributing to fall: s/p ORIF on 08/03/24.  On Eliquis  2.5 twice daily, scheduled senna and Norco/morphine  as needed Continue DVT prophylaxis, pt ot and pain management Awaiting for SNF and patient is medically stable Follow up: 2 weeks after surgery for a wound check with John Graham at [facility].  Address: [address], [place], [place] [address]  Office Phone: [phone]   Parkinson's disease Depression: issues with his memory unsure of notable events that are in his chart. seen by John Graham of neurology on 12/18 for which increasing tremor was noted and recommended to be started on carbidopa  levodopa . Continue delirium precaution. Patient need close follow-up with his neurology to initiate medication-as acute issue resolves. Continue Zoloft    history of a left leg DVT in 07/2023: anticoagulation was discontinued on 12/17 and was started on aspirin. Now on eliquis  2.5 mg BID for DVT Ppx post op per Ortho   Essential hypertension Controlled on metoprolol    History of lung cancer S/p SBRT treatment  back in 2023.  Management was reported the patient declined adjunctive therapy at that time.   Hyperlipidemia Continue statin, Zetia .   Prolonged Qts Improved.  QTc initially 514, but repeat EKG revealed QTc improved to 471. Monitor intermittently  Mobility: PT Orders: Active PT Follow up Rec: Skilled Nursing-Short Term Rehab (<3 Hours/Day)08/06/2024 1515   DVT prophylaxis: apixaban  (ELIQUIS ) tablet 2.5 mg Start: 08/04/24 1000 Code Status:   Code Status: Full Code Family Communication: plan of care discussed with patien at bedside. Patient status is: Remains hospitalized because of severity of illness Level of care: Telemetry   Dispo: The patient is from: home            Anticipated disposition: Awaiting SNF approval likely Friday Objective: Vitals last 24 hrs: Vitals:   08/06/24 0727 08/06/24 1419 08/06/24 1944 08/07/24 0358  BP: 120/67 113/66 113/67 114/60  Pulse: 79 76 76 61  Resp: 17 16 18 17   Temp: 98 F (36.7 C) 98 F (36.7 C) 97.8 F (36.6 C) 97.6 F (36.4 C)  TempSrc: Oral Oral Oral Oral  SpO2: 95% 100% 99% 98%  Weight:      Height:        Physical Examination: General exam: AAO, pleasant HEENT:Oral mucosa moist, Ear/Nose WNL grossly Respiratory system: CTA Bilaterally  Cardiovascular system: S1 & S2 +, No JVD. Gastrointestinal system: Abdomen soft,NT,ND, BS+ Nervous System: Alert, awake, moving all extremities,and following commands. Extremities: extremities warm, leg edema neg. Lle surgical site dressing in place c/d/I Skin: Warm, no rashes MSK: Normal muscle bulk,tone, power   Medications reviewed:  Scheduled Meds:  apixaban   2.5 mg Oral BID   ezetimibe   10  mg Oral Daily   metoprolol  tartrate  25 mg Oral BID   senna-docusate  1 tablet Oral BID   sertraline   50 mg Oral Daily   simvastatin   40 mg Oral q1800   Continuous Infusions: Diet: Diet Order             Diet regular Room service appropriate? Yes; Fluid consistency: Thin  Diet effective now

## 2024-08-06 NOTE — TOC Progression Note (Signed)
 Transition of Care [facility]) - Progression Note    Patient Details  Name: John Graham MRN: [id number] Date of Birth: 1946-08-04  [facility]) CM/SW Contact  Bridget Cordella Simmonds, LCSW Phone Number: 08/06/2024, 11:04 AM  Clinical Narrative:   CSW confirmed with [facility]: they can receive pt today with approved auth.   SNF auth request submitted in [place].   1030: SNF auth still pending. Message from [facility]: may not longer have bed for today, will update.    Expected Discharge Plan: Skilled Nursing Facility Barriers to Discharge: Continued Medical Work up, SNF Pending bed offer, Insurance Authorization               Expected Discharge Plan and Services In-house Referral: Clinical Social Work   Post Acute Care Choice: Skilled Nursing Facility Living arrangements for the past 2 months: Single Family Home Expected Discharge Date: 08/06/24                                     Social Drivers of Health (SDOH) Interventions SDOH Screenings   Food Insecurity: Patient Unable To Answer (08/03/2024)  Housing: Patient Unable To Answer (08/03/2024)  Transportation Needs: Patient Unable To Answer (08/03/2024)  Utilities: Patient Unable To Answer (08/03/2024)  Alcohol Screen: Low Risk (03/02/2023)  Depression (PHQ2-9): Low Risk (08/02/2023)  Financial Resource Strain: Low Risk (03/02/2023)  Physical Activity: Inactive (03/02/2023)  Social Connections: Patient Unable To Answer (08/03/2024)  Stress: No Stress Concern Present (03/02/2023)  Tobacco Use: High Risk (08/03/2024)  Health Literacy: Adequate Health Literacy (03/02/2023)    Readmission Risk Interventions     No data to display

## 2024-08-06 NOTE — Discharge Summary (Signed)
 Physician Discharge Summary  John Graham FMW:[id number] DOB: 02-Oct-1945 DOA: 08/03/2024  PCP: Micheal Wolm ORN, MD  Admit date: 08/03/2024 Discharge date: 08/06/2024 Recommendations for Outpatient Follow-up:  Follow up with PCP in 1 weeks-call for appointment Please obtain BMP/CBC in one week Follow up: 2 weeks after surgery for a wound check with Dr. Edna at [facility]  Address: [address], [place], [place] [address]  Office Phone: [phone]  Discharge Dispo: SNF Discharge Condition: Stable Code Status:   Code Status: Full Code Diet recommendation:  Diet Order             Diet regular Room service appropriate? Yes; Fluid consistency: Thin  Diet effective now                    Brief/Interim Summary: John Graham is a 78 y.o. male with PMH of f hypertension, hyperlipidemia, non-small cell lung cancer diagnosed in 2023 s/p completion of SBRT, frequent falls, Parkinson dz, left leg DVT previously on Eliquis  who presents after having a fall and Xray showed a left commuted displaced proximal femoral shaft fracture, and ortho was consulted. He underwent ORIF on 12/21. Postoperatively stable PT OT following and recommending skilled nursing facility, TOC following for disposition   Subjective: Seen and examined today Hard of hearing alert awake no new complaints Overnight on room air, afebrile, VSS, Labs 12/22 -stable BMP CBC with mild anemia 10.9   Discharge Diagnoses:   Left commuted displaced proximal femoral shaft fracture 2/2 fall- slid out of bed History of frequent falls History of Parkinson disease likely contributing to fall: s/p ORIF on 08/03/24.  On Eliquis  2.5 twice daily, scheduled senna and Norco/morphine  as needed Continue DVT prophylaxis PT OT pain meds as per orthopedics. Awaiting for SNF and patient is medically stable Follow up: 2 weeks after surgery for a wound check with Dr. Edna at [facility].  Address: [address], [place], [place] [address]  Office Phone: [phone]   Parkinson's disease Depression: issues with his memory unsure of notable events that are in his chart. seen by Dr. Skeet of neurology on 12/18 for which increasing tremor was noted and recommended to be started on carbidopa  levodopa . Continue delirium precaution.  Patient need close follow-up with his neurology to initiate medication-as acute issue resolves Continue Zoloft    history of a left leg DVT in 07/2023: anticoagulation was discontinued on 12/17 and was started on aspirin. Now on eliquis  2.5 mg BID for DVT Ppx post op per Ortho   Essential hypertension Controlled on home metoprolol    History of lung cancer S/p SBRT treatment back in 2023.  Management was reported the patient declined adjunctive therapy at that time.   Hyperlipidemia Continue statin, Zetia .   Prolonged Qts Improved.  QTc initially 514, but repeat EKG revealed QTc improved to 471. Monitor intermittently  Mobility: PT Orders: Active PT Follow up Rec: Skilled Nursing-Short Term Rehab (<3 Hours/Day)08/05/2024 1219   DVT prophylaxis: apixaban  (ELIQUIS ) tablet 2.5 mg Start: 08/04/24 1000 Code Status:   Code Status: Full Code Family Communication: plan of care discussed with patien at bedside. Patient status is: Remains hospitalized because of severity of illness Level of care: Telemetry   Dispo: The patient is from: home            Anticipated disposition: To skilled nursing facility once patient has a bed.Patient is medically ready  Objective: Vitals last 24 hrs: Vitals:   08/05/24  1500 08/05/24 1938 08/06/24 0527 08/06/24 0727  BP:  123/74 113/70 120/67  Pulse:  62 72 79  Resp:  16 19 17   Temp:  97.8 F (36.6 C) 98.3 F (36.8 C) 98 F (36.7 C)  TempSrc:  Oral Oral Oral  SpO2:  100% 98% 95%  Weight: 94.3 kg     Height: 5' 11 (1.803 m)       Physical Examination: General exam: alert awake,  oriented, older than stated age HEENT:Oral mucosa moist, Ear/Nose WNL grossly Respiratory system: Bilaterally clear BS,no use of accessory muscle Cardiovascular system: S1 & S2 +, No JVD. Gastrointestinal system: Abdomen soft,NT,ND, BS+ Nervous System: Alert, awake, moving all extremities,and following commands. Extremities: extremities warm, leg edema neg. left lower extremity surgical site dressing in place Skin: Warm, no rashes MSK: Normal muscle bulk,tone, power   Medications reviewed:  Scheduled Meds:  apixaban   2.5 mg Oral BID   ezetimibe   10 mg Oral Daily   metoprolol  tartrate  25 mg Oral BID   senna-docusate  1 tablet Oral BID   sertraline   50 mg Oral Daily   simvastatin   40 mg Oral q1800   Continuous Infusions: Diet: Diet Order             Diet regular Room service appropriate? Yes; Fluid consistency: Thin  Diet effective now                    Procedures (LRB): FIXATION, FRACTURE, INTERTROCHANTERIC, WITH INTRAMEDULLARY ROD (Left)  Consultation: See note.  Discharge Instructions  Discharge Instructions     Discharge instructions   Complete by: As directed    Check CBC in 1 week follow-up 2 weeks postop care with orthopedics as instructed  Please call call MD or return to ER for similar or worsening recurring problem that brought you to hospital or if any fever,nausea/vomiting,abdominal pain, uncontrolled pain, chest pain,  shortness of breath or any other alarming symptoms.  Please follow-up your doctor as instructed in a week time and call the office for appointment.  Please avoid alcohol, smoking, or any other illicit substance and maintain healthy habits including taking your regular medications as prescribed.  You were cared for by a hospitalist during your hospital stay. If you have any questions about your discharge medications or the care you received while you were in the hospital after you are discharged, you can call the unit and ask to speak with  the hospitalist on call if the hospitalist that took care of you is not available.  Once you are discharged, your primary care physician will handle any further medical issues. Please note that NO REFILLS for any discharge medications will be authorized once you are discharged, as it is imperative that you return to your primary care physician (or establish a relationship with a primary care physician if you do not have one) for your aftercare needs so that they can reassess your need for medications and monitor your lab values   Discharge wound care:   Complete by: As directed    Reinforce dressing as per orthopedics follow-up 2 weeks postop in the office   Increase activity slowly   Complete by: As directed       Allergies as of 08/06/2024   No Known Allergies      Medication List     STOP taking these medications    aspirin EC 81 MG tablet       TAKE these medications    apixaban  2.5  MG Tabs tablet Commonly known as: Eliquis  Take 1 tablet (2.5 mg total) by mouth 2 (two) times daily. What changed:  medication strength how much to take additional instructions   ezetimibe  10 MG tablet Commonly known as: ZETIA  TAKE 1 TABLET BY MOUTH EVERY DAY   HYDROcodone -acetaminophen  5-325 MG tablet Commonly known as: NORCO/VICODIN Take 1-2 tablets by mouth every 6 (six) hours as needed for moderate pain (pain score 4-6) or severe pain (pain score 7-10).   metoprolol  tartrate 25 MG tablet Commonly known as: LOPRESSOR  TAKE 1 TABLET BY MOUTH TWICE A DAY   multivitamin with minerals Tabs tablet Take 1 tablet by mouth in the morning. Centrum   ondansetron  4 MG tablet Commonly known as: Zofran  Take 1 tablet (4 mg total) by mouth every 8 (eight) hours as needed for up to 14 days for nausea or vomiting.   senna-docusate 8.6-50 MG tablet Commonly known as: Senokot-S Take 1 tablet by mouth 2 (two) times daily.   sertraline  50 MG tablet Commonly known as: ZOLOFT  TAKE 1 TABLET BY  MOUTH EVERY DAY   simvastatin  40 MG tablet Commonly known as: ZOCOR  TAKE 1 TABLET BY MOUTH EVERYDAY AT BEDTIME               Discharge Care Instructions  (From admission, onward)           Start     Ordered   08/06/24 0000  Discharge wound care:       Comments: Reinforce dressing as per orthopedics follow-up 2 weeks postop in the office   08/06/24 1039            Contact information for follow-up providers     Edna Toribio LABOR, MD Follow up in 2 week(s).   Specialty: Orthopedic Surgery Contact information: [address] [place] [address] [phone]         Micheal Wolm ORN, MD Follow up in 1 week(s).   Specialty: Family Medicine Contact information: [address] [place] [address] [phone]              Contact information for after-discharge care     Destination     [facility] .   Service: Skilled Nursing Contact information: [address] [place]  [address] [phone]                    Allergies[1]  The results of significant diagnostics from this hospitalization (including imaging, microbiology, ancillary and laboratory) are listed below for reference.    Microbiology: Recent Results (from the past 240 hours)  Surgical pcr screen     Status: None   Collection Time: 08/03/24  2:01 PM   Specimen: Nasal Mucosa; Nasal Swab  Result Value Ref Range Status   MRSA, PCR NEGATIVE NEGATIVE Final   Staphylococcus aureus NEGATIVE NEGATIVE Final    Comment: (NOTE) The Xpert SA Assay (FDA approved for NASAL specimens in patients 39 years of age and older), is one component of a comprehensive surveillance program. It is not intended to diagnose infection nor to guide or monitor treatment. Performed at [facility], [address] [place], [place] [address]     Procedures/Studies: DG FEMUR MIN 2 VIEWS LEFT Result Date:  08/03/2024 EXAM: 2 VIEW(S) XRAY OF THE FEMUR 08/03/2024 06:41:00 PM COMPARISON: Intraoperative films from earlier in the same day. CLINICAL HISTORY: Fracture [id number]. FINDINGS: BONES AND JOINTS: Intramedullary nail fixation of proximal femoral fracture in  place. Tricompartmental moderate to severe degenerative changes of knee. SOFT TISSUES: Vascular calcifications. IMPRESSION: 1. Intramedullary nail fixation of proximal femoral fracture in place. Electronically signed by: Oneil Devonshire MD 08/03/2024 07:49 PM EST RP Workstation: [id number] [id number] MIN 2 VIEWS LEFT Result Date: 08/03/2024 CLINICAL DATA:  Elective surgery. EXAM: LEFT FEMUR 2 VIEWS COMPARISON:  Left hip radiograph dated 08/03/2024. FINDINGS: Intraoperative fluoroscopic spot images of the left femur. The total fluoroscopic time is 113.5 seconds with [place] [id number] mGy. Status post ORIF of left femoral fracture. IMPRESSION: Intraoperative fluoroscopic spot images of the left femur. Electronically Signed   By: Vanetta Chou M.D.   On: 08/03/2024 16:09   DG C-Arm 1-60 Min-No Report Result Date: 08/03/2024 Fluoroscopy was utilized by the requesting physician.  No radiographic interpretation.   DG C-Arm 1-60 Min-No Report Result Date: 08/03/2024 Fluoroscopy was utilized by the requesting physician.  No radiographic interpretation.   Chest Portable 1 View Result Date: 08/03/2024 EXAM: 1 VIEW(S) XRAY OF THE CHEST 08/03/2024 11:12:21 AM COMPARISON: 07/28/2024 CLINICAL HISTORY: [id number] Fall [id number] FINDINGS: LUNGS AND PLEURA: Right upper lung scarring. Calcified right upper lung nodule. No pleural effusion. No pneumothorax. HEART AND MEDIASTINUM: Aortic calcification. Calcified right paratracheal lymph nodes. BONES AND SOFT TISSUES: No acute osseous abnormality. IMPRESSION: 1. No acute process. Electronically signed by: Waddell Calk MD 08/03/2024 11:26 AM EST RP Workstation: [id number]   DG FEMUR MIN 2 VIEWS LEFT Result Date:  08/03/2024 EXAM: 2 VIEW(S) XRAY OF THE LEFT FEMUR 08/03/2024 11:11:26 AM COMPARISON: None available. CLINICAL HISTORY: Fracture [id number] FINDINGS: BONES AND JOINTS: Acute displaced proximal left femoral fracture with varus angulation and proximal displacement of the distal fracture fragment. Severe tricompartmental degenerative changes of the left knee. SOFT TISSUES: Extensive atherosclerotic vascular calcifications. IMPRESSION: 1. Acute displaced proximal left femoral intertrochanteric fracture with varus angulation and proximal displacement of the distal fracture fragment. Electronically signed by: Waddell Calk MD 08/03/2024 11:25 AM EST RP Workstation: [id number]   DG Hip Unilat W or Wo Pelvis 2-3 Views Left Result Date: 08/03/2024 EXAM: 2 OR MORE VIEW(S) XRAY OF THE RIGHT HIP 08/03/2024 09:18:00 AM COMPARISON: None available. CLINICAL HISTORY: Fall FINDINGS: BONES AND JOINTS: Comminuted displaced proximal femoral shaft fracture with apex lateral angulation. Cm of shortening with 3.5 cm of medial displacement of the distal femoral shaft. Moderate right hip osteoarthritis. SOFT TISSUES: Diffuse aortoiliac and peripheral vascular atherosclerosis. LUMBAR SPINE: Multilevel degenerative changes of the lumbar spine. IMPRESSION: 1. Comminuted displaced proximal femoral shaft fracture with apex lateral angulation, 3.5 cm of medial displacement of the distal femoral shaft, and 5 cm of foreshortening. Electronically signed by: Rogelia Myers MD 08/03/2024 09:30 AM EST RP Workstation: [id number]   CT Head Wo Contrast Result Date: 07/28/2024 EXAM: CT HEAD WITHOUT 07/28/2024 11:58:32 AM TECHNIQUE: CT of the head was performed without the administration of intravenous contrast. Automated exposure control, iterative reconstruction, and/or weight based adjustment of the mA/kV was utilized to reduce the radiation dose to as low as reasonably achievable. COMPARISON: None available. CLINICAL HISTORY: Head trauma, minor (Age  >= 65y) FINDINGS: BRAIN AND VENTRICLES: No acute intracranial hemorrhage. No mass effect or midline shift. No extra-axial fluid collection. Mild to moderate chronic microvascular ischemic changes and generalized parenchymal volume loss. Remote lacunar infarct in the anterior aspect of the right basal ganglia. Atherosclerosis of the carotid siphons and intracranial left vertebral artery. No evidence of acute infarct. No hydrocephalus. ORBITS: No acute abnormality. SINUSES AND MASTOIDS: Scattered mucosal thickening throughout the paranasal  sinuses. Mucous retention cyst in the left maxillary sinus. SOFT TISSUES AND SKULL: No acute skull fracture. No acute soft tissue abnormality. IMPRESSION: 1. No acute intracranial abnormality. 2. Remote lacunar infarct in the right basal ganglia. 3. Mild to moderate chronic microvascular ischemic changes and generalized parenchymal volume loss. 4. Scattered paranasal sinus mucosal thickening and a left maxillary sinus mucous retention cyst. Electronically signed by: Donnice Mania MD 07/28/2024 12:15 PM EST RP Workstation: [id number]   DG Chest 2 View Result Date: 07/28/2024 EXAM: 2 VIEW(S) XRAY OF THE CHEST 07/28/2024 09:13:00 AM COMPARISON: Restaging chest CT 06/29/2022 and earlier. CLINICAL HISTORY: 78 year old male with multiple falls and history of lung cancer. FINDINGS: LUNGS AND PLEURA: Stable lung volumes and ventilation. Right upper lobe calcified granuloma. Mild mid right lung architectural distortion appears to be chronic. Right lung base scarring. No pleural effusion. No pneumothorax. HEART AND MEDIASTINUM: Atherosclerotic plaque. Chronic and benign coarsely calcified mediastinal lymph nodes. No acute abnormality of the cardiac and mediastinal silhouettes. BONES AND SOFT TISSUES: Osteopenia. Chronic wedging of lower thoracic vertebrae appears stable. UPPER ABDOMEN: Negative visible bowel gas. IMPRESSION: 1. No acute cardiopulmonary abnormality. Electronically signed  by: Helayne Hurst MD 07/28/2024 09:36 AM EST RP Workstation: [id number]   DG Wrist Complete Right Result Date: 07/28/2024 EXAM: 3 OR MORE VIEW(S) XRAY OF THE RIGHT WRIST 07/28/2024 09:13:00 AM COMPARISON: None available. CLINICAL HISTORY: 78 year old male. Status post multiple falls at home. Nonsmall cell lung cancer. FINDINGS: BONES AND JOINTS: Severe osteoarthritis of the first [facility] with marginal osteophytes and subchondral cyst formation. Indeterminate circumscribed small lucent areas are present in the distal radius, ulnar styloid, and distal carpal bones. Follow up whole body nuclear medicine bone scan may be valuable. SOFT TISSUES: The soft tissues are unremarkable. IMPRESSION: 1. No acute fracture or dislocation identified about the right wrist. 2. Indeterminate small lucent bone lesions in radius and ulna styloid, and carpals. Follow up whole body nuclear medicine bone scan may be valuable given history of lung cancer. 3. Severe first [facility] osteoarthritis. Electronically signed by: Helayne Hurst MD 07/28/2024 09:34 AM EST RP Workstation: [id number]   DG Knee Complete 4 Views Left Result Date: 07/28/2024 EXAM: 4 OR MORE VIEW(S) XRAY OF THE LEFT KNEE 07/28/2024 04:39:27 AM COMPARISON: None available. CLINICAL HISTORY: fall FINDINGS: BONES AND JOINTS: No acute fracture. No malalignment. Small suprapatellar bursal effusion. There is advanced arthrosis, particularly in the medial compartment where there is bone on bone joint space loss with joint surface remodeling. There are moderate tricompartmental marginal osteophytes with partial joint space loss in the patellofemoral and lateral compartments. No dislocation. SOFT TISSUES: There are heavy vascular calcifications. IMPRESSION: 1. No acute fracture or dislocation. 2. Compartmental arthrosis, but most advanced in the medial compartment. 3. Small suprapatellar bursal effusion. 4. Vascular calcification. Electronically signed by: Francis Quam MD 07/28/2024 05:01  AM EST RP Workstation: [id number]    Labs: BNP (last 3 results) No results for input(s): BNP in the last 8760 hours. Basic Metabolic Panel: Recent Labs  Lab 08/03/24 0816 08/04/24 0419  NA 140 141  K 4.5 4.3  CL 106 106  CO2 24 26  GLUCOSE 114* 118*  BUN 21 27*  CREATININE 0.90 1.08  CALCIUM 8.8* 8.7*   CBC: Recent Labs  Lab 08/03/24 0816 08/04/24 0419  WBC 7.0 6.8  HGB 12.5* 10.9*  HCT 38.6* 31.9*  MCV 96.3 92.7  PLT 183 177   Urinalysis    Component Value Date/Time   COLORURINE YELLOW 12/09/2011 2033   APPEARANCEUR CLEAR  12/09/2011 2033   LABSPEC 1.011 12/09/2011 2033   PHURINE 6.5 12/09/2011 2033   GLUCOSEU NEGATIVE 12/09/2011 2033   HGBUR NEGATIVE 12/09/2011 2033   BILIRUBINUR n 05/08/2012 1114   KETONESUR NEGATIVE 12/09/2011 2033   PROTEINUR n 05/08/2012 1114   PROTEINUR NEGATIVE 12/09/2011 2033   UROBILINOGEN 0.2 05/08/2012 1114   UROBILINOGEN 1.0 12/09/2011 2033   NITRITE n 05/08/2012 1114   NITRITE NEGATIVE 12/09/2011 2033   LEUKOCYTESUR small (1+) 05/08/2012 1114   Sepsis Labs Recent Labs  Lab 08/03/24 0816 08/04/24 0419  WBC 7.0 6.8   Microbiology Recent Results (from the past 240 hours)  Surgical pcr screen     Status: None   Collection Time: 08/03/24  2:01 PM   Specimen: Nasal Mucosa; Nasal Swab  Result Value Ref Range Status   MRSA, PCR NEGATIVE NEGATIVE Final   Staphylococcus aureus NEGATIVE NEGATIVE Final    Comment: (NOTE) The Xpert SA Assay (FDA approved for NASAL specimens in patients 65 years of age and older), is one component of a comprehensive surveillance program. It is not intended to diagnose infection nor to guide or monitor treatment. Performed at [facility], [address] [place], [place] [address]      Time coordinating discharge: 35  minutes  SIGNED: Mennie LAMY, MD  [facility] 08/06/2024, 10:39 AM  If 7PM-7AM, please contact night-coverage [email or web address]       [1] No Known  Allergies

## 2024-08-06 NOTE — Progress Notes (Signed)
 Mobility Specialist Progress Note:    08/06/24 1039  Mobility  Activity Pivoted/transferred from bed to chair  Level of Assistance Moderate assist, patient does 50-74% (Heavy +2)  Assistive Device Stedy  LLE Weight Bearing Per Provider Order WBAT  Activity Response Tolerated well  Mobility Referral Yes  Mobility visit 1 Mobility  Mobility Specialist Start Time (ACUTE ONLY) 0941  Mobility Specialist Stop Time (ACUTE ONLY) 0955  Mobility Specialist Time Calculation (min) (ACUTE ONLY) 14 min   Received pt in bed and agreeable to mobility. Pt required Heavy ModA +2 EOB and STS w/ Stedy. Pt c/o LLE pain, otherwise tolerated well. Left pt in chair with alarm on. Personal belongings and call light within reach. All needs met.  Lavanda Pollack Mobility Specialist  Please contact via [employer] or  Rehab Office [phone]

## 2024-08-07 DIAGNOSIS — S72002A Fracture of unspecified part of neck of left femur, initial encounter for closed fracture: Secondary | ICD-10-CM | POA: Diagnosis not present

## 2024-08-07 NOTE — Plan of Care (Signed)
  Problem: Pain Managment: Goal: General experience of comfort will improve and/or be controlled Outcome: Progressing   Problem: Safety: Goal: Ability to remain free from injury will improve Outcome: Progressing

## 2024-08-07 NOTE — TOC Transition Note (Signed)
 Transition of Care [facility]) - Discharge Note   Patient Details  Name: John Graham MRN: [id number] Date of Birth: 07/30/1946  [facility] of [facility]) CM/SW Contact:  Inocente GORMAN Kindle, LCSW Phone Number: 08/07/2024, 2:47 PM   Clinical Narrative:    Patient will DC to: [facility] Anticipated DC date: 08/07/24 Family notified: Daughter Transport by: [facility]   Per MD patient ready for DC to [place]. RN to call report prior to discharge [phone] room 806). RN, patient, patient's family, and facility notified of DC. Discharge Summary and FL2 sent to facility. DC packet on chart including signed script. Ambulance transport requested for patient.   CSW will sign off for now as social work intervention is no longer needed. Please consult us  again if new needs arise.     Final next level of care: Skilled Nursing Facility Barriers to Discharge: Barriers Resolved   Patient Goals and CMS Choice Patient states their goals for this hospitalization and ongoing recovery are:: To go to SNF CMS Medicare.gov Compare Post Acute Care list provided to:: Patient Choice offered to / list presented to : Patient, Adult Children [place] ownership interest in [facility].provided to:: Adult Children    Discharge Placement   Existing PASRR number confirmed : 08/07/24          Patient chooses bed at: [facility] Patient to be transferred to facility by: [facility] Name of family member notified: Daughter Patient and family notified of of transfer: 08/07/24  Discharge Plan and Services Additional resources added to the After Visit Summary for   In-house Referral: Clinical Social Work   Post Acute Care Choice: Skilled Nursing Facility                               Social Drivers of Health (SDOH) Interventions SDOH Screenings   Food Insecurity: Patient Unable To Answer (08/03/2024)  Housing: Patient Unable To Answer (08/03/2024)  Transportation Needs: Patient Unable To Answer  (08/03/2024)  Utilities: Patient Unable To Answer (08/03/2024)  Alcohol Screen: Low Risk (03/02/2023)  Depression (PHQ2-9): Low Risk (08/02/2023)  Financial Resource Strain: Low Risk (03/02/2023)  Physical Activity: Inactive (03/02/2023)  Social Connections: Patient Unable To Answer (08/03/2024)  Stress: No Stress Concern Present (03/02/2023)  Tobacco Use: High Risk (08/03/2024)  Health Literacy: Adequate Health Literacy (03/02/2023)     Readmission Risk Interventions     No data to display

## 2024-08-07 NOTE — TOC Progression Note (Addendum)
 Transition of Care [facility]) - Progression Note    Patient Details  Name: John Graham MRN: [id number] Date of Birth: May 22, 1946  Transition of Care [facility]) CM/SW Contact  Inocente GORMAN Kindle, LCSW Phone Number: 08/07/2024, 9:05 AM  Clinical Narrative:    [facility] should be able to accept patient on Friday (no admissions today due to the holiday).  Insurance approval received, Ref# [id number], Shara ID# [id number], effective 08/06/2024-08/11/2024.  12:37 PM-Received notification from [facility] that they can accept patient today with updated DC Summary. CSW updated patient's daughter and confirmed [facility] can access his medications today. Daughter confirmed [facility] for transport.    Expected Discharge Plan: Skilled Nursing Facility Barriers to Discharge: SNF Pending bed offer               Expected Discharge Plan and Services In-house Referral: Clinical Social Work   Post Acute Care Choice: Skilled Nursing Facility Living arrangements for the past 2 months: Single Family Home Expected Discharge Date: 08/06/24                                     Social Drivers of Health (SDOH) Interventions SDOH Screenings   Food Insecurity: Patient Unable To Answer (08/03/2024)  Housing: Patient Unable To Answer (08/03/2024)  Transportation Needs: Patient Unable To Answer (08/03/2024)  Utilities: Patient Unable To Answer (08/03/2024)  Alcohol Screen: Low Risk (03/02/2023)  Depression (PHQ2-9): Low Risk (08/02/2023)  Financial Resource Strain: Low Risk (03/02/2023)  Physical Activity: Inactive (03/02/2023)  Social Connections: Patient Unable To Answer (08/03/2024)  Stress: No Stress Concern Present (03/02/2023)  Tobacco Use: High Risk (08/03/2024)  Health Literacy: Adequate Health Literacy (03/02/2023)    Readmission Risk Interventions     No data to display

## 2024-08-07 NOTE — Progress Notes (Signed)
 " PROGRESS NOTE John Graham  FMW:[id number] DOB: [birth year] DOA: 08/03/2024 PCP: Micheal Wolm ORN, MD  Brief Narrative/Hospital Course: John Graham is a 78 y.o. male with PMH of f hypertension, hyperlipidemia, non-small cell lung cancer diagnosed in 2023 s/p completion of SBRT, frequent falls, Parkinson dz, left leg DVT previously on Eliquis  who presents after having a fall and Xray showed a left commuted displaced proximal femoral shaft fracture, and ortho was consulted. He underwent ORIF on 12/21. Postoperatively stable PT OT following and recommending skilled nursing facility, TOC following for disposition Waiting for SNF, at this time medically stable  Subjective: Seen and examined Alert awake resting comfortably Overnight on room air, afebrile, VSS, Labs 12/22 -stable BMP CBC with mild anemia 10.9 .  Last BM 12/23  Assessment and plan:  Left commuted displaced proximal femoral shaft fracture 2/2 fall- slid out of bed History of frequent falls History of Parkinson disease likely contributing to fall: s/p ORIF on 08/03/24.  On Eliquis  2.5 twice daily, scheduled senna and Norco/morphine  as needed Continue DVT prophylaxis, pt ot and pain management Awaiting for SNF and patient is medically stable Follow up: 2 weeks after surgery for a wound check with Dr. Edna at [facility].  Address: [address], [place], [place] [address]  Office Phone: [phone]   Parkinson's disease Depression: issues with his memory unsure of notable events that are in his chart. seen by Dr. Skeet of neurology on 12/18 for which increasing tremor was noted and recommended to be started on carbidopa  levodopa . Continue delirium precaution. Patient need close follow-up with his neurology to initiate medication-as acute issue resolves. Continue Zoloft    history of a left leg DVT in 07/2023: anticoagulation was discontinued on 12/17 and was started on aspirin. Now on eliquis   2.5 mg BID for DVT Ppx post op per Ortho   Essential hypertension Controlled on metoprolol    History of lung cancer S/p SBRT treatment back in 2023.  Management was reported the patient declined adjunctive therapy at that time.   Hyperlipidemia Continue statin, Zetia .   Prolonged Qts Improved.  QTc initially 514, but repeat EKG revealed QTc improved to 471. Monitor intermittently  Mobility: PT Orders: Active PT Follow up Rec: Skilled Nursing-Short Term Rehab (<3 Hours/Day)08/06/2024 1515   DVT prophylaxis: apixaban  (ELIQUIS ) tablet 2.5 mg Start: 08/04/24 1000 Code Status:   Code Status: Full Code Family Communication: plan of care discussed with patien at bedside. Patient status is: Remains hospitalized because of severity of illness Level of care: Telemetry   Dispo: The patient is from: home            Anticipated disposition: Awaiting SNF approval likely Friday Objective: Vitals last 24 hrs: Vitals:   08/06/24 0727 08/06/24 1419 08/06/24 1944 08/07/24 0358  BP: 120/67 113/66 113/67 114/60  Pulse: 79 76 76 61  Resp: 17 16 18 17   Temp: 98 F (36.7 C) 98 F (36.7 C) 97.8 F (36.6 C) 97.6 F (36.4 C)  TempSrc: Oral Oral Oral Oral  SpO2: 95% 100% 99% 98%  Weight:      Height:        Physical Examination: General exam: AAO, pleasant HEENT:Oral mucosa moist, Ear/Nose WNL grossly Respiratory system: CTA Bilaterally  Cardiovascular system: S1 & S2 +, No JVD. Gastrointestinal system: Abdomen soft,NT,ND, BS+ Nervous System: Alert, awake, moving all extremities,and following commands. Extremities: extremities warm, leg edema neg. Lle surgical site dressing in place c/d/I Skin: Warm, no rashes MSK: Normal muscle  bulk,tone, power   Medications reviewed:  Scheduled Meds:  apixaban   2.5 mg Oral BID   ezetimibe   10 mg Oral Daily   metoprolol  tartrate  25 mg Oral BID   senna-docusate  1 tablet Oral BID   sertraline   50 mg Oral Daily   simvastatin   40 mg Oral q1800    Continuous Infusions: Diet: Diet Order             Diet regular Room service appropriate? Yes; Fluid consistency: Thin  Diet effective now                    Data Reviewed: I have personally reviewed following labs and imaging studies ( see epic result tab) CBC: Recent Labs  Lab 08/03/24 0816 08/04/24 0419  WBC 7.0 6.8  HGB 12.5* 10.9*  HCT 38.6* 31.9*  MCV 96.3 92.7  PLT 183 177   CMP: Recent Labs  Lab 08/03/24 0816 08/04/24 0419  NA 140 141  K 4.5 4.3  CL 106 106  CO2 24 26  GLUCOSE 114* 118*  BUN 21 27*  CREATININE 0.90 1.08  CALCIUM 8.8* 8.7*   GFR: Estimated Creatinine Clearance: 66.1 mL/min (by C-G formula based on SCr of 1.08 mg/dL). No results for input(s): AST, ALT, ALKPHOS, BILITOT, PROT, ALBUMIN in the last 168 hours. No results for input(s): LIPASE, AMYLASE in the last 168 hours. No results for input(s): AMMONIA in the last 168 hours. Coagulation Profile:  Recent Labs  Lab 08/03/24 1215  INR 1.1   Unresulted Labs (From admission, onward)    None      Antimicrobials/Microbiology: Anti-infectives (From admission, onward)    Start     Dose/Rate Route Frequency Ordered Stop   08/04/24 0600  ceFAZolin  (ANCEF ) IVPB 2g/100 mL premix        2 g 200 mL/hr over 30 Minutes Intravenous On call to O.R. 08/03/24 1359 08/03/24 1456   08/03/24 2200  ceFAZolin  (ANCEF ) IVPB 2g/100 mL premix        2 g 200 mL/hr over 30 Minutes Intravenous Every 8 hours 08/03/24 1818 08/04/24 0546         Component Value Date/Time   SDES TISSUE 02/09/2022 0818   SDES TISSUE 02/09/2022 0818   SPECREQUEST RLL NODULE 02/09/2022 0818   SPECREQUEST RLL NODULE 02/09/2022 0818   CULT  02/09/2022 0818    NO FUNGUS ISOLATED AFTER 21 DAYS Performed at [facility], [address], [place], [place] [address]    CULT  02/09/2022 0818    No growth aerobically or anaerobically. Performed at [facility], [address], [place], [place]  [address]    REPTSTATUS 03/02/2022 FINAL 02/09/2022 0818   REPTSTATUS 02/14/2022 FINAL 02/09/2022 0818    Procedures: Procedures (LRB): FIXATION, FRACTURE, INTERTROCHANTERIC, WITH INTRAMEDULLARY ROD (Left)   Mennie LAMY, MD [facility] 08/07/2024, 10:44 AM   "

## 2024-08-08 NOTE — Care Management Important Message (Signed)
 Important Message  Patient Details  Name: John Graham MRN: [id number] Date of Birth: May 25, 1946   Important Message Given:  No     [name] 08/08/2024, 10:32 AM

## 2024-08-08 NOTE — Progress Notes (Signed)
 Patient discharged, Important Message Letter mailed to patient.

## 2024-08-26 ENCOUNTER — Telehealth: Payer: Self-pay | Admitting: Neurology

## 2024-08-26 NOTE — Telephone Encounter (Signed)
 Pt's daughter [name] called  in this afternoon. [name] stated that she needs a return call, because  she has some questions about Parkinson disease her father has.Thanks

## 2024-08-26 NOTE — Telephone Encounter (Signed)
 Per patient daughter patient seen on 07/31/24. Patient fell three days later and broke his hip.  Patient daughter wanted to know if it safe to be on Blood thinners( Eliquis  5 mg) only from the time he was in the ED 08/03/24. With the Carbidopa -Levodopa .  Patient is unable to do his PT due to his weakness in his arms.   Daughter wants to know this normal with the Progression of the Parkinson's.

## 2024-08-26 NOTE — Telephone Encounter (Signed)
 Patient daughter advised of Dr.jaffe note, It is okay to take Eliquis  and carbidopa -levodopa . As for the weakness, it is unlikely to be progression of Parkinson's as Parkinson's doesn't progress that fast. Strength was fine last month. Any acute symptoms is typically being triggered by something else such as body response to fracturing a hip vs UTI, etc.

## 2024-09-22 ENCOUNTER — Ambulatory Visit: Admitting: Family Medicine

## 2024-12-11 ENCOUNTER — Ambulatory Visit: Payer: Self-pay | Admitting: Neurology
